# Patient Record
Sex: Female | Born: 1955 | Race: White | Hispanic: No | Marital: Married | Smoking: Never smoker
Health system: Southern US, Community
[De-identification: ages and names within clinical notes are randomized; demographics above are authoritative.]

## PROBLEM LIST (undated history)

## (undated) DIAGNOSIS — I10 Essential (primary) hypertension: Secondary | ICD-10-CM

## (undated) DIAGNOSIS — J302 Other seasonal allergic rhinitis: Secondary | ICD-10-CM

## (undated) DIAGNOSIS — R079 Chest pain, unspecified: Secondary | ICD-10-CM

## (undated) DIAGNOSIS — T7840XA Allergy, unspecified, initial encounter: Secondary | ICD-10-CM

## (undated) DIAGNOSIS — I341 Nonrheumatic mitral (valve) prolapse: Secondary | ICD-10-CM

## (undated) DIAGNOSIS — E785 Hyperlipidemia, unspecified: Secondary | ICD-10-CM

## (undated) DIAGNOSIS — F419 Anxiety disorder, unspecified: Secondary | ICD-10-CM

## (undated) DIAGNOSIS — K5790 Diverticulosis of intestine, part unspecified, without perforation or abscess without bleeding: Secondary | ICD-10-CM

## (undated) DIAGNOSIS — C801 Malignant (primary) neoplasm, unspecified: Secondary | ICD-10-CM

## (undated) HISTORY — DX: Anxiety disorder, unspecified: F41.9

## (undated) HISTORY — PX: TUBAL LIGATION: SHX77

## (undated) HISTORY — PX: ABDOMINAL HYSTERECTOMY: SHX81

## (undated) HISTORY — DX: Chest pain, unspecified: R07.9

## (undated) HISTORY — PX: KNEE ARTHROSCOPY: SUR90

## (undated) HISTORY — DX: Allergy, unspecified, initial encounter: T78.40XA

## (undated) HISTORY — PX: COLONOSCOPY: SHX174

## (undated) HISTORY — DX: Essential (primary) hypertension: I10

## (undated) HISTORY — DX: Other seasonal allergic rhinitis: J30.2

## (undated) HISTORY — DX: Nonrheumatic mitral (valve) prolapse: I34.1

## (undated) HISTORY — DX: Malignant (primary) neoplasm, unspecified: C80.1

## (undated) HISTORY — DX: Hyperlipidemia, unspecified: E78.5

## (undated) HISTORY — DX: Diverticulosis of intestine, part unspecified, without perforation or abscess without bleeding: K57.90

---

## 1974-10-12 HISTORY — PX: TONSILLECTOMY: SUR1361

## 1990-10-12 HISTORY — PX: SPINE SURGERY: SHX786

## 1991-10-13 HISTORY — PX: PARTIAL HYSTERECTOMY: SHX80

## 1998-03-19 ENCOUNTER — Ambulatory Visit (HOSPITAL_COMMUNITY): Admission: RE | Admit: 1998-03-19 | Discharge: 1998-03-19 | Payer: Self-pay | Admitting: Obstetrics and Gynecology

## 1998-07-05 ENCOUNTER — Ambulatory Visit (HOSPITAL_COMMUNITY): Admission: RE | Admit: 1998-07-05 | Discharge: 1998-07-05 | Payer: Self-pay | Admitting: Internal Medicine

## 1998-07-19 ENCOUNTER — Ambulatory Visit (HOSPITAL_COMMUNITY): Admission: RE | Admit: 1998-07-19 | Discharge: 1998-07-19 | Payer: Self-pay | Admitting: Internal Medicine

## 1998-09-19 ENCOUNTER — Ambulatory Visit (HOSPITAL_COMMUNITY): Admission: RE | Admit: 1998-09-19 | Discharge: 1998-09-19 | Payer: Self-pay | Admitting: Internal Medicine

## 1998-10-10 ENCOUNTER — Ambulatory Visit (HOSPITAL_COMMUNITY): Admission: RE | Admit: 1998-10-10 | Discharge: 1998-10-10 | Payer: Self-pay | Admitting: Internal Medicine

## 1998-10-21 ENCOUNTER — Ambulatory Visit (HOSPITAL_COMMUNITY): Admission: RE | Admit: 1998-10-21 | Discharge: 1998-10-21 | Payer: Self-pay | Admitting: Pulmonary Disease

## 1998-10-21 ENCOUNTER — Encounter: Payer: Self-pay | Admitting: *Deleted

## 1998-10-25 ENCOUNTER — Ambulatory Visit (HOSPITAL_COMMUNITY): Admission: RE | Admit: 1998-10-25 | Discharge: 1998-10-25 | Payer: Self-pay | Admitting: Obstetrics and Gynecology

## 1999-04-04 ENCOUNTER — Ambulatory Visit (HOSPITAL_COMMUNITY): Admission: RE | Admit: 1999-04-04 | Discharge: 1999-04-04 | Payer: Self-pay

## 1999-04-07 ENCOUNTER — Ambulatory Visit (HOSPITAL_COMMUNITY): Admission: RE | Admit: 1999-04-07 | Discharge: 1999-04-07 | Payer: Self-pay | Admitting: Obstetrics and Gynecology

## 1999-04-07 ENCOUNTER — Encounter: Payer: Self-pay | Admitting: Obstetrics and Gynecology

## 1999-05-27 ENCOUNTER — Other Ambulatory Visit: Admission: RE | Admit: 1999-05-27 | Discharge: 1999-05-27 | Payer: Self-pay | Admitting: Obstetrics and Gynecology

## 1999-07-30 ENCOUNTER — Emergency Department (HOSPITAL_COMMUNITY): Admission: EM | Admit: 1999-07-30 | Discharge: 1999-07-30 | Payer: Self-pay

## 2000-04-15 ENCOUNTER — Ambulatory Visit (HOSPITAL_COMMUNITY): Admission: RE | Admit: 2000-04-15 | Discharge: 2000-04-15 | Payer: Self-pay | Admitting: Obstetrics and Gynecology

## 2000-04-15 ENCOUNTER — Encounter: Payer: Self-pay | Admitting: Obstetrics and Gynecology

## 2000-04-17 ENCOUNTER — Encounter: Payer: Self-pay | Admitting: Internal Medicine

## 2000-04-17 ENCOUNTER — Ambulatory Visit (HOSPITAL_COMMUNITY): Admission: RE | Admit: 2000-04-17 | Discharge: 2000-04-17 | Payer: Self-pay | Admitting: Internal Medicine

## 2000-06-15 ENCOUNTER — Emergency Department (HOSPITAL_COMMUNITY): Admission: EM | Admit: 2000-06-15 | Discharge: 2000-06-15 | Payer: Self-pay | Admitting: *Deleted

## 2000-06-24 ENCOUNTER — Other Ambulatory Visit: Admission: RE | Admit: 2000-06-24 | Discharge: 2000-06-24 | Payer: Self-pay | Admitting: Obstetrics and Gynecology

## 2000-10-04 ENCOUNTER — Encounter: Payer: Self-pay | Admitting: Surgery

## 2000-10-04 ENCOUNTER — Emergency Department (HOSPITAL_COMMUNITY): Admission: EM | Admit: 2000-10-04 | Discharge: 2000-10-04 | Payer: Self-pay | Admitting: Internal Medicine

## 2000-10-04 ENCOUNTER — Encounter: Payer: Self-pay | Admitting: Endocrinology

## 2001-02-28 ENCOUNTER — Encounter: Payer: Self-pay | Admitting: Specialist

## 2001-02-28 ENCOUNTER — Ambulatory Visit (HOSPITAL_COMMUNITY): Admission: RE | Admit: 2001-02-28 | Discharge: 2001-02-28 | Payer: Self-pay | Admitting: Specialist

## 2001-05-31 ENCOUNTER — Encounter: Payer: Self-pay | Admitting: Obstetrics and Gynecology

## 2001-05-31 ENCOUNTER — Ambulatory Visit (HOSPITAL_COMMUNITY): Admission: RE | Admit: 2001-05-31 | Discharge: 2001-05-31 | Payer: Self-pay | Admitting: Obstetrics and Gynecology

## 2001-09-12 ENCOUNTER — Other Ambulatory Visit: Admission: RE | Admit: 2001-09-12 | Discharge: 2001-09-12 | Payer: Self-pay | Admitting: Obstetrics and Gynecology

## 2001-12-20 ENCOUNTER — Encounter: Payer: Self-pay | Admitting: Internal Medicine

## 2001-12-20 ENCOUNTER — Encounter: Admission: RE | Admit: 2001-12-20 | Discharge: 2001-12-20 | Payer: Self-pay | Admitting: Internal Medicine

## 2001-12-29 ENCOUNTER — Encounter: Payer: Self-pay | Admitting: Internal Medicine

## 2001-12-29 ENCOUNTER — Ambulatory Visit (HOSPITAL_COMMUNITY): Admission: RE | Admit: 2001-12-29 | Discharge: 2001-12-29 | Payer: Self-pay | Admitting: *Deleted

## 2002-04-05 ENCOUNTER — Encounter: Payer: Self-pay | Admitting: Cardiology

## 2002-04-05 ENCOUNTER — Ambulatory Visit (HOSPITAL_COMMUNITY): Admission: RE | Admit: 2002-04-05 | Discharge: 2002-04-05 | Payer: Self-pay | Admitting: Internal Medicine

## 2002-05-17 ENCOUNTER — Encounter: Payer: Self-pay | Admitting: Radiology

## 2002-06-06 ENCOUNTER — Ambulatory Visit (HOSPITAL_COMMUNITY): Admission: RE | Admit: 2002-06-06 | Discharge: 2002-06-06 | Payer: Self-pay | Admitting: Obstetrics and Gynecology

## 2002-06-06 ENCOUNTER — Encounter: Payer: Self-pay | Admitting: Obstetrics and Gynecology

## 2002-09-20 ENCOUNTER — Other Ambulatory Visit: Admission: RE | Admit: 2002-09-20 | Discharge: 2002-09-20 | Payer: Self-pay | Admitting: Obstetrics and Gynecology

## 2003-07-13 ENCOUNTER — Ambulatory Visit (HOSPITAL_COMMUNITY): Admission: RE | Admit: 2003-07-13 | Discharge: 2003-07-13 | Payer: Self-pay | Admitting: Obstetrics and Gynecology

## 2003-07-13 ENCOUNTER — Encounter: Payer: Self-pay | Admitting: Obstetrics and Gynecology

## 2003-09-07 ENCOUNTER — Encounter: Payer: Self-pay | Admitting: Internal Medicine

## 2004-02-25 ENCOUNTER — Ambulatory Visit (HOSPITAL_COMMUNITY): Admission: RE | Admit: 2004-02-25 | Discharge: 2004-02-25 | Payer: Self-pay | Admitting: Internal Medicine

## 2004-04-18 ENCOUNTER — Other Ambulatory Visit: Admission: RE | Admit: 2004-04-18 | Discharge: 2004-04-18 | Payer: Self-pay | Admitting: Obstetrics and Gynecology

## 2004-07-15 ENCOUNTER — Ambulatory Visit (HOSPITAL_COMMUNITY): Admission: RE | Admit: 2004-07-15 | Discharge: 2004-07-15 | Payer: Self-pay | Admitting: Obstetrics and Gynecology

## 2004-11-12 ENCOUNTER — Ambulatory Visit: Payer: Self-pay | Admitting: Internal Medicine

## 2004-11-12 ENCOUNTER — Observation Stay (HOSPITAL_COMMUNITY): Admission: EM | Admit: 2004-11-12 | Discharge: 2004-11-13 | Payer: Self-pay | Admitting: Internal Medicine

## 2004-11-12 ENCOUNTER — Ambulatory Visit: Payer: Self-pay | Admitting: Cardiology

## 2004-11-13 ENCOUNTER — Ambulatory Visit: Payer: Self-pay

## 2004-11-17 ENCOUNTER — Ambulatory Visit: Payer: Self-pay | Admitting: Internal Medicine

## 2005-01-29 ENCOUNTER — Ambulatory Visit: Payer: Self-pay | Admitting: Internal Medicine

## 2005-05-03 ENCOUNTER — Encounter: Payer: Self-pay | Admitting: Internal Medicine

## 2005-05-22 ENCOUNTER — Ambulatory Visit: Payer: Self-pay | Admitting: Internal Medicine

## 2005-05-28 ENCOUNTER — Ambulatory Visit: Payer: Self-pay | Admitting: Internal Medicine

## 2005-08-11 ENCOUNTER — Ambulatory Visit (HOSPITAL_COMMUNITY): Admission: RE | Admit: 2005-08-11 | Discharge: 2005-08-11 | Payer: Self-pay | Admitting: Obstetrics and Gynecology

## 2005-11-10 ENCOUNTER — Ambulatory Visit: Payer: Self-pay | Admitting: Internal Medicine

## 2005-11-13 ENCOUNTER — Other Ambulatory Visit: Admission: RE | Admit: 2005-11-13 | Discharge: 2005-11-13 | Payer: Self-pay | Admitting: Obstetrics and Gynecology

## 2005-12-07 ENCOUNTER — Ambulatory Visit: Payer: Self-pay | Admitting: Gastroenterology

## 2005-12-18 ENCOUNTER — Ambulatory Visit: Payer: Self-pay | Admitting: Gastroenterology

## 2006-02-05 ENCOUNTER — Ambulatory Visit: Payer: Self-pay | Admitting: Internal Medicine

## 2006-03-19 ENCOUNTER — Ambulatory Visit: Payer: Self-pay | Admitting: Internal Medicine

## 2006-04-13 ENCOUNTER — Ambulatory Visit: Payer: Self-pay | Admitting: Oncology

## 2006-06-10 ENCOUNTER — Ambulatory Visit: Payer: Self-pay | Admitting: Hematology & Oncology

## 2006-06-10 LAB — BASIC METABOLIC PANEL
CO2: 30 mEq/L (ref 19–32)
Chloride: 105 mEq/L (ref 96–112)
Glucose, Bld: 79 mg/dL (ref 70–99)
Potassium: 3.6 mEq/L (ref 3.5–5.3)
Sodium: 140 mEq/L (ref 135–145)

## 2006-06-15 ENCOUNTER — Ambulatory Visit: Payer: Self-pay | Admitting: Internal Medicine

## 2006-08-17 ENCOUNTER — Ambulatory Visit (HOSPITAL_COMMUNITY): Admission: RE | Admit: 2006-08-17 | Discharge: 2006-08-17 | Payer: Self-pay | Admitting: Internal Medicine

## 2006-09-13 ENCOUNTER — Emergency Department (HOSPITAL_COMMUNITY): Admission: EM | Admit: 2006-09-13 | Discharge: 2006-09-13 | Payer: Self-pay | Admitting: Family Medicine

## 2006-11-04 ENCOUNTER — Ambulatory Visit: Payer: Self-pay | Admitting: Internal Medicine

## 2006-11-16 ENCOUNTER — Ambulatory Visit: Payer: Self-pay | Admitting: Internal Medicine

## 2006-12-29 ENCOUNTER — Ambulatory Visit: Payer: Self-pay | Admitting: Internal Medicine

## 2007-01-25 ENCOUNTER — Encounter: Payer: Self-pay | Admitting: Internal Medicine

## 2007-01-25 LAB — CONVERTED CEMR LAB
Potassium: 4.1 meq/L (ref 3.5–5.3)
Total CK: 132 units/L (ref 7–177)
Vit D, 1,25-Dihydroxy: 22 (ref 20–57)

## 2007-03-15 ENCOUNTER — Ambulatory Visit: Payer: Self-pay | Admitting: Internal Medicine

## 2007-03-15 DIAGNOSIS — G44209 Tension-type headache, unspecified, not intractable: Secondary | ICD-10-CM

## 2007-03-16 ENCOUNTER — Encounter: Payer: Self-pay | Admitting: Internal Medicine

## 2007-03-17 LAB — CONVERTED CEMR LAB
ALT: 23 units/L (ref 0–35)
AST: 22 units/L (ref 0–37)
Albumin: 4.6 g/dL (ref 3.5–5.2)
Alkaline Phosphatase: 57 units/L (ref 39–117)
BUN: 17 mg/dL (ref 6–23)
Bilirubin, Direct: 0.1 mg/dL (ref 0.0–0.3)
CO2: 22 meq/L (ref 19–32)
Calcium: 11 mg/dL — ABNORMAL HIGH (ref 8.4–10.5)
Chloride: 106 meq/L (ref 96–112)
Cholesterol: 174 mg/dL (ref 0–200)
Creatinine, Ser: 0.8 mg/dL (ref 0.40–1.20)
Glucose, Bld: 84 mg/dL (ref 70–99)
HDL: 50 mg/dL (ref >39)
Indirect Bilirubin: 0.5 mg/dL (ref 0.0–0.9)
LDL Cholesterol: 107 mg/dL — ABNORMAL HIGH (ref 0–99)
Potassium: 4.6 meq/L (ref 3.5–5.3)
Sodium: 141 meq/L (ref 135–145)
TSH: 1.493 microintl units/mL (ref 0.350–5.50)
Total Bilirubin: 0.6 mg/dL (ref 0.3–1.2)
Total CHOL/HDL Ratio: 3.5
Total Protein: 7.3 g/dL (ref 6.0–8.3)
Triglycerides: 84 mg/dL (ref <150)
VLDL: 17 mg/dL (ref 0–40)

## 2007-03-21 ENCOUNTER — Encounter: Payer: Self-pay | Admitting: Internal Medicine

## 2007-03-23 ENCOUNTER — Encounter: Payer: Self-pay | Admitting: Internal Medicine

## 2007-03-24 ENCOUNTER — Encounter: Payer: Self-pay | Admitting: Internal Medicine

## 2007-03-26 LAB — CONVERTED CEMR LAB
Calcium, Total (PTH): 10.4 mg/dL (ref 8.4–10.5)
PTH: 38.8 pg/mL (ref 14.0–72.0)

## 2007-04-14 ENCOUNTER — Encounter (INDEPENDENT_AMBULATORY_CARE_PROVIDER_SITE_OTHER): Payer: Self-pay | Admitting: *Deleted

## 2007-05-04 ENCOUNTER — Ambulatory Visit: Payer: Self-pay | Admitting: Internal Medicine

## 2007-05-05 ENCOUNTER — Encounter: Payer: Self-pay | Admitting: Internal Medicine

## 2007-05-06 LAB — CONVERTED CEMR LAB
Calcium: 10.4 mg/dL (ref 8.4–10.5)
Free T4: 1.49 ng/dL (ref 0.89–1.80)
Magnesium: 2.1 mg/dL (ref 1.5–2.5)
Potassium: 4 meq/L (ref 3.5–5.3)
T3, Free: 3.2 pg/mL (ref 2.3–4.2)
TSH: 2.143 microintl units/mL (ref 0.350–5.50)

## 2007-05-09 ENCOUNTER — Encounter (INDEPENDENT_AMBULATORY_CARE_PROVIDER_SITE_OTHER): Payer: Self-pay | Admitting: *Deleted

## 2007-06-08 ENCOUNTER — Ambulatory Visit: Payer: Self-pay | Admitting: Cardiovascular Disease

## 2007-06-17 ENCOUNTER — Ambulatory Visit: Payer: Self-pay

## 2007-06-17 ENCOUNTER — Encounter (INDEPENDENT_AMBULATORY_CARE_PROVIDER_SITE_OTHER): Payer: Self-pay | Admitting: *Deleted

## 2007-07-15 ENCOUNTER — Telehealth: Payer: Self-pay | Admitting: Internal Medicine

## 2007-07-15 ENCOUNTER — Ambulatory Visit: Payer: Self-pay | Admitting: Internal Medicine

## 2007-07-18 ENCOUNTER — Encounter (INDEPENDENT_AMBULATORY_CARE_PROVIDER_SITE_OTHER): Payer: Self-pay | Admitting: *Deleted

## 2007-07-30 ENCOUNTER — Ambulatory Visit (HOSPITAL_COMMUNITY): Admission: RE | Admit: 2007-07-30 | Discharge: 2007-07-30 | Payer: Self-pay | Admitting: Orthopedic Surgery

## 2007-08-23 ENCOUNTER — Ambulatory Visit: Payer: Self-pay | Admitting: Cardiovascular Disease

## 2007-08-23 ENCOUNTER — Telehealth (INDEPENDENT_AMBULATORY_CARE_PROVIDER_SITE_OTHER): Payer: Self-pay | Admitting: *Deleted

## 2007-09-07 ENCOUNTER — Telehealth (INDEPENDENT_AMBULATORY_CARE_PROVIDER_SITE_OTHER): Payer: Self-pay | Admitting: *Deleted

## 2007-09-27 ENCOUNTER — Telehealth (INDEPENDENT_AMBULATORY_CARE_PROVIDER_SITE_OTHER): Payer: Self-pay | Admitting: *Deleted

## 2007-10-04 ENCOUNTER — Ambulatory Visit: Payer: Self-pay | Admitting: Internal Medicine

## 2007-10-04 DIAGNOSIS — E782 Mixed hyperlipidemia: Secondary | ICD-10-CM

## 2007-10-14 ENCOUNTER — Encounter (INDEPENDENT_AMBULATORY_CARE_PROVIDER_SITE_OTHER): Payer: Self-pay | Admitting: *Deleted

## 2007-10-14 LAB — CONVERTED CEMR LAB
ALT: 26 units/L (ref 0–35)
AST: 27 units/L (ref 0–37)
BUN: 7 mg/dL (ref 6–23)
Creatinine, Ser: 0.6 mg/dL (ref 0.4–1.2)
Potassium: 3.4 meq/L — ABNORMAL LOW (ref 3.5–5.1)

## 2007-10-28 ENCOUNTER — Ambulatory Visit (HOSPITAL_COMMUNITY): Admission: RE | Admit: 2007-10-28 | Discharge: 2007-10-28 | Payer: Self-pay | Admitting: Obstetrics and Gynecology

## 2007-11-02 ENCOUNTER — Telehealth (INDEPENDENT_AMBULATORY_CARE_PROVIDER_SITE_OTHER): Payer: Self-pay | Admitting: *Deleted

## 2007-11-07 ENCOUNTER — Encounter: Payer: Self-pay | Admitting: Internal Medicine

## 2007-12-06 ENCOUNTER — Telehealth (INDEPENDENT_AMBULATORY_CARE_PROVIDER_SITE_OTHER): Payer: Self-pay | Admitting: *Deleted

## 2007-12-16 ENCOUNTER — Ambulatory Visit: Payer: Self-pay | Admitting: Internal Medicine

## 2007-12-18 LAB — CONVERTED CEMR LAB: Cortisol, Plasma: 14.9 ug/dL

## 2007-12-22 ENCOUNTER — Encounter: Payer: Self-pay | Admitting: Internal Medicine

## 2007-12-27 ENCOUNTER — Encounter (INDEPENDENT_AMBULATORY_CARE_PROVIDER_SITE_OTHER): Payer: Self-pay | Admitting: *Deleted

## 2007-12-30 ENCOUNTER — Encounter (INDEPENDENT_AMBULATORY_CARE_PROVIDER_SITE_OTHER): Payer: Self-pay | Admitting: *Deleted

## 2007-12-30 LAB — CONVERTED CEMR LAB
Catecholamines Tot(E+NE) 24 Hr U: 0.039 mg/24hr
Dopamine 24 Hr Urine: 116 mcg/24hr (ref <500)
Epinephrine 24 Hr Urine: 3 mcg/24hr (ref <20)
Metaneph Total, Ur: 266 ug/24hr (ref 224–832)
Metanephrines, Ur: 61 — ABNORMAL LOW (ref 90–315)
Norepinephrine 24 Hr Urine: 36 mcg/24hr (ref <80)
Normetanephrine, 24H Ur: 205 (ref 122–676)

## 2008-01-16 ENCOUNTER — Ambulatory Visit: Payer: Self-pay | Admitting: Internal Medicine

## 2008-01-16 DIAGNOSIS — I10 Essential (primary) hypertension: Secondary | ICD-10-CM

## 2008-01-23 ENCOUNTER — Encounter (INDEPENDENT_AMBULATORY_CARE_PROVIDER_SITE_OTHER): Payer: Self-pay | Admitting: *Deleted

## 2008-01-23 LAB — CONVERTED CEMR LAB
ALT: 30 units/L (ref 0–35)
AST: 29 units/L (ref 0–37)
Albumin: 3.6 g/dL (ref 3.5–5.2)
Alkaline Phosphatase: 43 units/L (ref 39–117)
BUN: 12 mg/dL (ref 6–23)
Basophils Absolute: 0 10*3/uL (ref 0.0–0.1)
Basophils Relative: 0.4 % (ref 0.0–1.0)
Bilirubin, Direct: 0.1 mg/dL (ref 0.0–0.3)
CO2: 29 meq/L (ref 19–32)
Calcium: 9.8 mg/dL (ref 8.4–10.5)
Chloride: 111 meq/L (ref 96–112)
Cholesterol: 222 mg/dL (ref 0–200)
Creatinine, Ser: 0.7 mg/dL (ref 0.4–1.2)
Direct LDL: 174.3 mg/dL
Eosinophils Absolute: 0.1 10*3/uL (ref 0.0–0.7)
Eosinophils Relative: 2.6 % (ref 0.0–5.0)
Free T4: 1 ng/dL (ref 0.6–1.6)
GFR calc Af Amer: 113 mL/min
GFR calc non Af Amer: 93 mL/min
Glucose, Bld: 94 mg/dL (ref 70–99)
HCT: 41 % (ref 36.0–46.0)
HDL: 39.7 mg/dL (ref >39.0)
Hemoglobin: 13.8 g/dL (ref 12.0–15.0)
Lymphocytes Relative: 24.5 % (ref 12.0–46.0)
MCHC: 33.6 g/dL (ref 30.0–36.0)
MCV: 89.6 fL (ref 78.0–100.0)
Monocytes Absolute: 0.5 10*3/uL (ref 0.1–1.0)
Monocytes Relative: 8.4 % (ref 3.0–12.0)
Neutro Abs: 3.7 10*3/uL (ref 1.4–7.7)
Neutrophils Relative %: 64.1 % (ref 43.0–77.0)
Platelets: 259 10*3/uL (ref 150–400)
Potassium: 3.7 meq/L (ref 3.5–5.1)
RBC: 4.58 M/uL (ref 3.87–5.11)
RDW: 12.4 % (ref 11.5–14.6)
Rheumatoid fact SerPl-aCnc: <20 intl units/mL — ABNORMAL LOW (ref 0.0–20.0)
Sed Rate: 13 mm/hr (ref 0–22)
Sodium: 142 meq/L (ref 135–145)
TSH: 1.71 microintl units/mL (ref 0.35–5.50)
Total Bilirubin: 0.8 mg/dL (ref 0.3–1.2)
Total CHOL/HDL Ratio: 5.6
Total CK: 95 units/L (ref 7–177)
Total Protein: 6.5 g/dL (ref 6.0–8.3)
Triglycerides: 105 mg/dL (ref 0–149)
Uric Acid, Serum: 5.5 mg/dL (ref 2.4–7.0)
VLDL: 21 mg/dL (ref 0–40)
WBC: 5.7 10*3/uL (ref 4.5–10.5)

## 2008-01-30 ENCOUNTER — Encounter: Payer: Self-pay | Admitting: Internal Medicine

## 2008-02-03 ENCOUNTER — Telehealth (INDEPENDENT_AMBULATORY_CARE_PROVIDER_SITE_OTHER): Payer: Self-pay | Admitting: *Deleted

## 2008-02-09 ENCOUNTER — Encounter (INDEPENDENT_AMBULATORY_CARE_PROVIDER_SITE_OTHER): Payer: Self-pay | Admitting: *Deleted

## 2008-02-13 ENCOUNTER — Telehealth (INDEPENDENT_AMBULATORY_CARE_PROVIDER_SITE_OTHER): Payer: Self-pay | Admitting: *Deleted

## 2008-05-08 ENCOUNTER — Telehealth (INDEPENDENT_AMBULATORY_CARE_PROVIDER_SITE_OTHER): Payer: Self-pay | Admitting: *Deleted

## 2008-05-22 ENCOUNTER — Ambulatory Visit: Payer: Self-pay | Admitting: Internal Medicine

## 2008-07-18 ENCOUNTER — Ambulatory Visit: Payer: Self-pay | Admitting: Internal Medicine

## 2008-07-18 DIAGNOSIS — F411 Generalized anxiety disorder: Secondary | ICD-10-CM

## 2008-07-18 DIAGNOSIS — K5730 Diverticulosis of large intestine without perforation or abscess without bleeding: Secondary | ICD-10-CM

## 2008-07-18 LAB — CONVERTED CEMR LAB
Cholesterol, target level: 200 mg/dL
HDL goal, serum: 40 mg/dL
LDL Goal: 130 mg/dL

## 2008-07-22 LAB — CONVERTED CEMR LAB
ALT: 27 units/L (ref 0–35)
AST: 29 units/L (ref 0–37)
Albumin: 3.7 g/dL (ref 3.5–5.2)
Alkaline Phosphatase: 59 units/L (ref 39–117)
BUN: 9 mg/dL (ref 6–23)
Basophils Absolute: 0 10*3/uL (ref 0.0–0.1)
Basophils Relative: 0.3 % (ref 0.0–3.0)
Bilirubin, Direct: 0.1 mg/dL (ref 0.0–0.3)
CO2: 31 meq/L (ref 19–32)
Calcium: 9.9 mg/dL (ref 8.4–10.5)
Chloride: 107 meq/L (ref 96–112)
Cholesterol: 173 mg/dL (ref 0–200)
Creatinine, Ser: 0.8 mg/dL (ref 0.4–1.2)
Eosinophils Absolute: 0.1 10*3/uL (ref 0.0–0.7)
Eosinophils Relative: 1.8 % (ref 0.0–5.0)
GFR calc Af Amer: 96 mL/min
GFR calc non Af Amer: 80 mL/min
Glucose, Bld: 87 mg/dL (ref 70–99)
HCT: 40.7 % (ref 36.0–46.0)
HDL: 47 mg/dL (ref >39.0)
Hemoglobin: 13.9 g/dL (ref 12.0–15.0)
LDL Cholesterol: 106 mg/dL — ABNORMAL HIGH (ref 0–99)
Lymphocytes Relative: 22.9 % (ref 12.0–46.0)
MCHC: 34.1 g/dL (ref 30.0–36.0)
MCV: 90.1 fL (ref 78.0–100.0)
Monocytes Absolute: 0.4 10*3/uL (ref 0.1–1.0)
Monocytes Relative: 7.4 % (ref 3.0–12.0)
Neutro Abs: 4 10*3/uL (ref 1.4–7.7)
Neutrophils Relative %: 67.6 % (ref 43.0–77.0)
Platelets: 262 10*3/uL (ref 150–400)
Potassium: 3.8 meq/L (ref 3.5–5.1)
RBC: 4.51 M/uL (ref 3.87–5.11)
RDW: 12.2 % (ref 11.5–14.6)
Sodium: 142 meq/L (ref 135–145)
TSH: 2.41 microintl units/mL (ref 0.35–5.50)
Total Bilirubin: 0.9 mg/dL (ref 0.3–1.2)
Total CHOL/HDL Ratio: 3.7
Total Protein: 6.7 g/dL (ref 6.0–8.3)
Triglycerides: 98 mg/dL (ref 0–149)
VLDL: 20 mg/dL (ref 0–40)
WBC: 5.9 10*3/uL (ref 4.5–10.5)

## 2008-07-23 ENCOUNTER — Encounter (INDEPENDENT_AMBULATORY_CARE_PROVIDER_SITE_OTHER): Payer: Self-pay | Admitting: *Deleted

## 2008-10-12 HISTORY — PX: ROTATOR CUFF REPAIR: SHX139

## 2008-11-27 ENCOUNTER — Telehealth: Payer: Self-pay | Admitting: Internal Medicine

## 2008-12-05 ENCOUNTER — Encounter: Payer: Self-pay | Admitting: Internal Medicine

## 2009-01-29 ENCOUNTER — Ambulatory Visit (HOSPITAL_COMMUNITY): Admission: RE | Admit: 2009-01-29 | Discharge: 2009-01-29 | Payer: Self-pay | Admitting: Obstetrics and Gynecology

## 2009-03-04 ENCOUNTER — Ambulatory Visit: Payer: Self-pay | Admitting: Internal Medicine

## 2009-04-24 ENCOUNTER — Encounter: Payer: Self-pay | Admitting: Internal Medicine

## 2009-05-03 ENCOUNTER — Encounter: Payer: Self-pay | Admitting: Internal Medicine

## 2009-05-05 LAB — CONVERTED CEMR LAB
ALT: 28 units/L (ref 0–35)
AST: 28 units/L (ref 0–37)
Albumin: 3.8 g/dL (ref 3.5–5.2)
Alkaline Phosphatase: 58 units/L (ref 39–117)
BUN: 16 mg/dL (ref 6–23)
Bilirubin, Direct: 0.1 mg/dL (ref 0.0–0.3)
Cholesterol: 160 mg/dL (ref 0–200)
Creatinine, Ser: 0.73 mg/dL (ref 0.40–1.20)
HDL: 46 mg/dL (ref >39)
Indirect Bilirubin: 0.3 mg/dL (ref 0.0–0.9)
LDL Cholesterol: 98 mg/dL (ref 0–99)
Potassium: 4 meq/L (ref 3.5–5.3)
Total Bilirubin: 0.4 mg/dL (ref 0.3–1.2)
Total CHOL/HDL Ratio: 3.5
Total Protein: 6.7 g/dL (ref 6.0–8.3)
Triglycerides: 79 mg/dL (ref <150)
VLDL: 16 mg/dL (ref 0–40)

## 2009-05-06 ENCOUNTER — Encounter (INDEPENDENT_AMBULATORY_CARE_PROVIDER_SITE_OTHER): Payer: Self-pay | Admitting: *Deleted

## 2009-05-27 ENCOUNTER — Telehealth (INDEPENDENT_AMBULATORY_CARE_PROVIDER_SITE_OTHER): Payer: Self-pay | Admitting: *Deleted

## 2010-01-30 ENCOUNTER — Encounter: Payer: Self-pay | Admitting: Internal Medicine

## 2010-01-30 ENCOUNTER — Ambulatory Visit (HOSPITAL_COMMUNITY): Admission: RE | Admit: 2010-01-30 | Discharge: 2010-01-30 | Payer: Self-pay | Admitting: Obstetrics and Gynecology

## 2010-01-30 LAB — HM DEXA SCAN

## 2010-03-18 ENCOUNTER — Ambulatory Visit: Payer: Self-pay | Admitting: Internal Medicine

## 2010-03-19 ENCOUNTER — Telehealth (INDEPENDENT_AMBULATORY_CARE_PROVIDER_SITE_OTHER): Payer: Self-pay | Admitting: *Deleted

## 2010-05-06 ENCOUNTER — Ambulatory Visit: Payer: Self-pay | Admitting: Internal Medicine

## 2010-05-06 DIAGNOSIS — E559 Vitamin D deficiency, unspecified: Secondary | ICD-10-CM

## 2010-05-13 LAB — CONVERTED CEMR LAB
ALT: 23 units/L (ref 0–35)
AST: 26 units/L (ref 0–37)
Albumin: 4.2 g/dL (ref 3.5–5.2)
Alkaline Phosphatase: 75 units/L (ref 39–117)
BUN: 9 mg/dL (ref 6–23)
Basophils Absolute: 0 10*3/uL (ref 0.0–0.1)
Basophils Relative: 0.8 % (ref 0.0–3.0)
Bilirubin, Direct: 0.1 mg/dL (ref 0.0–0.3)
CO2: 30 meq/L (ref 19–32)
Calcium: 10.2 mg/dL (ref 8.4–10.5)
Chloride: 104 meq/L (ref 96–112)
Cholesterol: 329 mg/dL — ABNORMAL HIGH (ref 0–200)
Creatinine, Ser: 0.7 mg/dL (ref 0.4–1.2)
Direct LDL: 260.1 mg/dL
Eosinophils Absolute: 0.1 10*3/uL (ref 0.0–0.7)
Eosinophils Relative: 2.1 % (ref 0.0–5.0)
GFR calc non Af Amer: 97.19 mL/min (ref >60)
Glucose, Bld: 95 mg/dL (ref 70–99)
HCT: 40.3 % (ref 36.0–46.0)
HDL: 52.5 mg/dL (ref >39.00)
Hemoglobin: 13.9 g/dL (ref 12.0–15.0)
Lymphocytes Relative: 26.4 % (ref 12.0–46.0)
Lymphs Abs: 1.5 10*3/uL (ref 0.7–4.0)
MCHC: 34.7 g/dL (ref 30.0–36.0)
MCV: 89.6 fL (ref 78.0–100.0)
Monocytes Absolute: 0.4 10*3/uL (ref 0.1–1.0)
Monocytes Relative: 7.6 % (ref 3.0–12.0)
Neutro Abs: 3.5 10*3/uL (ref 1.4–7.7)
Neutrophils Relative %: 63.1 % (ref 43.0–77.0)
Platelets: 244 10*3/uL (ref 150.0–400.0)
Potassium: 3.7 meq/L (ref 3.5–5.1)
RBC: 4.49 M/uL (ref 3.87–5.11)
RDW: 13.2 % (ref 11.5–14.6)
Sodium: 139 meq/L (ref 135–145)
TSH: 2.2 microintl units/mL (ref 0.35–5.50)
Total Bilirubin: 0.9 mg/dL (ref 0.3–1.2)
Total CHOL/HDL Ratio: 6
Total Protein: 7.2 g/dL (ref 6.0–8.3)
Triglycerides: 131 mg/dL (ref 0.0–149.0)
VLDL: 26.2 mg/dL (ref 0.0–40.0)
Vit D, 25-Hydroxy: 36 ng/mL (ref 30–89)
WBC: 5.5 10*3/uL (ref 4.5–10.5)

## 2010-06-19 ENCOUNTER — Telehealth (INDEPENDENT_AMBULATORY_CARE_PROVIDER_SITE_OTHER): Payer: Self-pay | Admitting: *Deleted

## 2010-06-20 ENCOUNTER — Ambulatory Visit: Payer: Self-pay | Admitting: Internal Medicine

## 2010-08-06 ENCOUNTER — Telehealth (INDEPENDENT_AMBULATORY_CARE_PROVIDER_SITE_OTHER): Payer: Self-pay | Admitting: *Deleted

## 2010-11-02 ENCOUNTER — Encounter: Payer: Self-pay | Admitting: Internal Medicine

## 2010-11-11 NOTE — Progress Notes (Signed)
Summary: Medication Concerns   Phone Note From Pharmacy Call back at [phone]   Caller: [facility] Summary of Call: Message left on VM: RX for Levaquin was sent in and Levaquin is going generic and the brand name is on back order right now, ? would you like to change med or send to a different pharmacy.   Dr.Hopper please advise./[facility]  March 19, 2010 9:29 AM   Follow-up for Phone Call        Avelox 400 mg once daily #5 Follow-up by: Marga Melnick MD,  March 19, 2010 5:06 PM  Additional Follow-up for Phone Call Additional follow up Details #1::        Spoke with [facility] in the pharmacy and med change given Additional Follow-up by: Shonna Chock,  March 19, 2010 5:08 PM    New/Updated Medications: AVELOX 400 MG TABS (MOXIFLOXACIN HCL) 1 by mouth x 5 days

## 2010-11-11 NOTE — Assessment & Plan Note (Signed)
Summary: FOLLOWUP/KN   Vital Signs:  Patient profile:   55 year old female Weight:      165.4 pounds Temp:     98.3 degrees F oral Pulse rate:   64 / minute Resp:     14 per minute BP sitting:   132 / 86  (left arm) Cuff size:   large  Vitals Entered By: Shonna Chock CMA (June 20, 2010 3:32 PM) CC: B/P concerns and refill Cyclobenzaprine Comments Patient's Cuff: 146/89, pulse 79   CC:  B/P concerns and refill Cyclobenzaprine.  History of Present Illness: Hypertension Follow-Up      This is a 55 year old woman who presents for Hypertension follow-up.  The patient reports headaches, but denies lightheadedness, urinary frequency, edema, and fatigue.  The patient denies the following associated symptoms: chest pain, chest pressure, exercise intolerance, dyspnea, and palpitations.  Adjunctive measures currently used by the patient include salt restriction. BP range 135/88- 136/101. She feels the trigger is stressof husband's job loss & daughter's health.   Allergies: 1)  ! * Shrimp 2)  Keflex 3)  * Emycin 4)  Codeine 5)  * Calcium Channel Blockers 6)  Maxzide 7)  Norvasc  Physical Exam  General:  well-nourished,in no acute distress; alert,appropriate and cooperative throughout examination Lungs:  Normal respiratory effort, chest expands symmetrically. Lungs are clear to auscultation, no crackles or wheezes. Heart:  Normal rate and regular rhythm. S1 and S2 normal without gallop, murmur, click, rub. Pulses:  R and L carotid,radial,dorsalis pedis and posterior tibial pulses are full and equal bilaterally Extremities:  No clubbing, cyanosis, edema. Psych:  memory intact for recent and remote, normally interactive, good eye contact, not anxious appearing, and not depressed appearing.     Impression & Recommendations:  Problem # 1:  HYPERTENSION, ESSENTIAL NOS (ICD-401.9)  Her updated medication list for this problem includes:    Lisinopril 5 Mg Tabs (Lisinopril) .Marland Kitchen... 2   by mouth once daily    Spironolactone 25 Mg Tabs (Spironolactone) .Marland Kitchen... 1 tab by mouth  twice   daily   Complete Medication List: 1)  Lisinopril 20 Mg Tabs (lisinopril)  .... 1/2 -1  by mouth once daily to keep bp average < 135/85 2)  Cyclobenzaprine Hcl 10 Mg Tabs (Cyclobenzaprine hcl) .... 1/2 -1 at bedtime as needed 3)  Omega Three  .... Once daily 4)  Spironolactone 25 Mg Tabs (Spironolactone) .Marland Kitchen.. 1 two times a day 5)  Crestor 20 Mg Tabs (Rosuvastatin calcium) .Marland Kitchen.. 1 once daily  Other Orders: Admin 1st Vaccine (16109) Flu Vaccine 53yrs + (60454) Flu Vaccine Consent Questions     Do you have a history of severe allergic reactions to this vaccine? no    Any prior history of allergic reactions to egg and/or gelatin? no    Do you have a sensitivity to the preservative Thimersol? no    Do you have a past history of Guillan-Barre Syndrome? no    Do you currently have an acute febrile illness? no    Have you ever had a severe reaction to latex? no    Vaccine information given and explained to patient? yes    Are you currently pregnant? no    Lot Number:[id number]   Exp Date:04/11/2011   Site Given  Left Deltoid IMOrders: Admin 1st Vaccine (09811) Flu Vaccine 26yrs + (91478)  Patient Instructions: 1)  Call if excess stress needs to be addressed. 2)  Check your Blood Pressure regularly.Your goal = AVERAGE < 135/85.  Prescriptions: SPIRONOLACTONE 25 MG TABS (SPIRONOLACTONE) 1 two times a day  #180 x 1   Entered and Authorized by:   Marga Melnick MD   Signed by:   Marga Melnick MD on 06/20/2010   Method used:   Print then Give to Patient   RxID:   [id number] LISINOPRIL 20 MG TABS (LISINOPRIL) 1/2 -1  by mouth once daily to keep BP average < 135/85  #90 x 1   Entered and Authorized by:   Marga Melnick MD   Signed by:   Marga Melnick MD on 06/20/2010   Method used:   Print then Give to Patient   RxID:   [id number]  .lbflu

## 2010-11-11 NOTE — Assessment & Plan Note (Signed)
Summary: pneumonia??//lch   Vital Signs:  Patient profile:   55 year old female Weight:      171.8 pounds BMI:     31.03 Temp:     98.5 degrees F oral Pulse rate:   84 / minute Resp:     14 per minute BP sitting:   116 / 78  (left arm) Cuff size:   large  Vitals Entered By: Shonna Chock (March 18, 2010 2:41 PM) CC: Not feeling well Comments REVIEWED MED LIST, PATIENT AGREED DOSE AND INSTRUCTION CORRECT    CC:  Not feeling well.  History of Present Illness: Onset as ST & frontal headache 03/15/2010 followed by earache & chest pressure with purulence. Rx: vitamin C, Benadryl at bedtime .  Allergies: 1)  ! * Shrimp 2)  Keflex 3)  * Emycin 4)  Codeine 5)  * Calcium Channel Blockers 6)  Maxzide 7)  Norvasc  Review of Systems General:  Denies chills, fever, and sweats. ENT:  Complains of nasal congestion; denies ear discharge; No facial or dental pain; scant nasal D/C . Using Neti pot . Resp:  Complains of shortness of breath, sputum productive, and wheezing; denies chest pain with inspiration, coughing up blood, and pleuritic; Slight yellow; no PMH of asthma.  Physical Exam  General:  well-nourished,in no acute distress; alert,appropriate and cooperative throughout examination Ears:  External ear exam shows no significant lesions or deformities.  Otoscopic examination reveals  wax t bilaterally  Hearing is grossly normal bilaterally. Nose:  External nasal examination shows no deformity or inflammation. Nasal mucosa are pink and moist without lesions or exudates. Mouth:  Oral mucosa and oropharynx without lesions or exudates.  Teeth in good repair. Slightly hoarse Lungs:  Normal respiratory effort, chest expands symmetrically. Lungs are clear to auscultation, no crackles or wheezes. Dry cough   Impression & Recommendations:  Problem # 1:  BRONCHITIS-ACUTE (ICD-466.0)  Her updated medication list for this problem includes:    Levaquin 500 Mg Tabs (Levofloxacin) .Marland Kitchen... 1  once daily  Problem # 2:  URI (ICD-465.9)  Complete Medication List: 1)  Lisinopril 5 Mg Tabs (Lisinopril) .Marland Kitchen.. 1 by mouth once daily 2)  Cyclobenzaprine Hcl 10 Mg Tabs (Cyclobenzaprine hcl) .... 1/2 -1 at bedtime as needed 3)  Omega Three  .... Once daily 4)  Vytorin 10-20 Mg Tabs (Ezetimibe-simvastatin) .Marland Kitchen.. 1 by mouth qhs 5)  Spironolactone 25 Mg Tabs (Spironolactone) .Marland Kitchen.. 1 tab by mouth once daily as needed for edema 6)  Levaquin 500 Mg Tabs (Levofloxacin) .Marland Kitchen.. 1 once daily  Patient Instructions: 1)  Drink as much fluid as you can tolerate for the next few days. Robitussin DM as needed as per package. Continue Neti pot once daily until sinuses are clear. Advair 100/50  sample as recommended for cough, wheezing. Prescriptions: LEVAQUIN 500 MG TABS (LEVOFLOXACIN) 1 once daily  #5 x 0   Entered and Authorized by:   Marga Melnick MD   Signed by:   Marga Melnick MD on 03/18/2010   Method used:   Faxed to ...       [facility]* (retail)       [address] [place], [place]  [address]       Ph: [phone] or [phone]       Fax: [phone]   RxID:   [id number]

## 2010-11-11 NOTE — Assessment & Plan Note (Signed)
Summary: CPX AND FASTING LABS ///SPH   Vital Signs:  Patient profile:   55 year old female Height:      62.75 inches Weight:      170.4 pounds Temp:     98.2 degrees F oral Pulse rate:   72 / minute Resp:     14 per minute BP sitting:   122 / 80  (left arm) Cuff size:   large  Vitals Entered By: Shonna Chock CMA (May 06, 2010 8:46 AM)  Comments Patient will check on her TDaP vaccine, patient was employeed with [employer] and [employer] and possibly had vaccine update there.    History of Present Illness: Mrs. Eber is here for a physical; Dr. Talmage Nap documented hypercalcemia (? 10.6) in 01/2010.Recheck was requested. Her BMD revealed "bone loss" in 03/2010.  Images reviewed ; no hip images.PMH of vitamin D deficiency. Hyperlipidemia Follow-Up      This is a 55 year old woman who presents for Hyperlipidemia follow-up.  The patient denies muscle aches, GI upset, abdominal pain, flushing, itching, constipation, diarrhea, and fatigue.  Other symptoms include pedal edema which is chronic, worse in Summer.  The patient denies the following symptoms: chest pain/pressure, exercise intolerance, dypsnea, palpitations, and syncope.  Compliance with medications (by patient report) has been near 100%.  Dietary compliance has been excellent.  The patient reports exercising 3-5 X per week. She teaches yoga 3X/week.  Adjunctive measures currently used by the patient include ASA.   Hypertension Follow-Up      The patient also presents for Hypertension follow-up.  The patient denies lightheadedness, urinary frequency, headaches, and rash.  Compliance with medications (by patient report) has been near 100%.  Adjunctive measures currently used by the patient include salt restriction.  BP @ home 110/65-120/80.  Lipid Management History:      Positive NCEP/ATP III risk factors include hypertension.  Negative NCEP/ATP III risk factors include female age less than 56 years old, non-diabetic, no family  history for ischemic heart disease, non-tobacco-user status, no ASHD (atherosclerotic heart disease), no prior stroke/TIA, no peripheral vascular disease, and no history of aortic aneurysm.     Current Medications (verified): 1)  Lisinopril 5 Mg Tabs (Lisinopril) .Marland Kitchen.. 1 By Mouth Once Daily 2)  Cyclobenzaprine Hcl 10 Mg  Tabs (Cyclobenzaprine Hcl) .... 1/2 -1 At Bedtime As Needed 3)  Omega Three .... Once Daily 4)  Vytorin 10-20 Mg  Tabs (Ezetimibe-Simvastatin) .Marland Kitchen.. 1 By Mouth Qhs 5)  Spironolactone 25 Mg Tabs (Spironolactone) .Marland Kitchen.. 1 Tab By Mouth Once Daily As Needed For Edema  Allergies: 1)  ! * Shrimp 2)  Keflex 3)  * Emycin 4)  Codeine 5)  * Calcium Channel Blockers 6)  Maxzide 7)  Norvasc  Past History:  Past Medical History: Hypertension Chest pain 2006: non cardiac etiology, Dr Eden Emms  palpitations, PMH of ativ chest tightness; nege  nuclear stress test 2008 headache, tension Diverticulosis, colon Hyperlipidemia: Framingham Study LDL goal = < 160.NMR Lipoprofile 2004: LDL 121(1352/649), HDL 38,TG 92. LDL goal= < 115, ideally < 90.  Past Surgical History: Hysterectomy for adenomyosis with heavy menses Tonsillectomy Lumbar laminectomy L5,S1 knee sugery bilaterally (Arthroscopic) G2 P2 Rotator cuff repair 03/2009, Dr Thomasena Edis  Family History: Father: alcoholism Mother: Guillian Barre,thyroid disease ,hyperlipidemia Siblings: 2 bros &  1 sister:increased lipids   Social History: Occupation: Network engineer Married Never Smoked Alcohol use-yes: rarely Regular exercise-yes  Review of Systems General:  Denies chills, fever, sleep disorder, sweats, and weight loss.  Eyes:  Denies blurring, double vision, and vision loss-both eyes. ENT:  Denies decreased hearing, difficulty swallowing, and hoarseness. Resp:  Denies cough, shortness of breath, sputum productive, and wheezing. GI:  Denies abdominal pain, bloody stools, dark tarry stools, and indigestion. GU:   Denies discharge, dysuria, and hematuria. MS:  Denies joint pain, joint redness, joint swelling, low back pain, mid back pain, and thoracic pain; Occasional swelling R knee. Derm:  Denies changes in nail beds, dryness, hair loss, lesion(s), and rash. Neuro:  Denies disturbances in coordination, numbness, poor balance, and tingling. Psych:  Complains of anxiety and easily tearful; denies depression, easily angered, and irritability; Family stress issues. Endo:  Denies cold intolerance, excessive hunger, excessive thirst, excessive urination, and heat intolerance. Heme:  Denies abnormal bruising and bleeding. Allergy:  Complains of itching eyes and sneezing; No Rx.  Physical Exam  General:  well-nourished; alert,appropriate and cooperative throughout examination Head:  Normocephalic and atraumatic without obvious abnormalities.  hair very fine Eyes:  No corneal or conjunctival inflammation noted. Perrla but dilated. Funduscopic exam benign, without hemorrhages, exudates or papilledema.  Ears:  External ear exam shows no significant lesions or deformities.   Hearing is grossly normal bilaterally.Some wax bilaterally. Nose:  External nasal examination shows no deformity or inflammation. Nasal mucosa are pink and moist without lesions or exudates. Mouth:  Oral mucosa and oropharynx without lesions or exudates.  Teeth in good repair. Neck:  No deformities, masses, or tenderness noted. Lungs:  Normal respiratory effort, chest expands symmetrically. Lungs are clear to auscultation, no crackles or wheezes. Heart:  Normal rate and regular rhythm. S1 and S2 normal without gallop, murmur, click, rub . S4 Abdomen:  Bowel sounds positive,abdomen soft and non-tender without masses, organomegaly or hernias noted. Genitalia:  Dr Vincente Poli Msk:  No deformity or scoliosis noted of thoracic or lumbar spine.   Pulses:  R and L carotid,radial,dorsalis pedis and posterior tibial pulses are full and equal  bilaterally Extremities:  No clubbing, cyanosis, edema, or deformity noted with normal full range of motion of all joints.   Neurologic:  alert & oriented X3 and DTRs symmetrical and normal.   Skin:  Intact without suspicious lesions or rashes Cervical Nodes:  No lymphadenopathy noted Axillary Nodes:  No palpable lymphadenopathy Psych:  memory intact for recent and remote, normally interactive, good eye contact, not anxious appearing, and not depressed appearing.     Impression & Recommendations:  Problem # 1:  ROUTINE GENERAL MEDICAL EXAM@HEALTH  CARE FACL (ICD-V70.0)  Orders: EKG w/ Interpretation (93000) Venipuncture (16109) TLB-Lipid Panel (80061-LIPID) TLB-BMP (Basic Metabolic Panel-BMET) (80048-METABOL) TLB-CBC Platelet - w/Differential (85025-CBCD) TLB-Hepatic/Liver Function Pnl (80076-HEPATIC) TLB-TSH (Thyroid Stimulating Hormone) (84443-TSH) T-Vitamin D (25-Hydroxy) ([address]11)  Problem # 2:  HYPERCALCEMIA (ICD-275.42)  Orders: Venipuncture (91478)  Problem # 3:  HYPERTENSION, ESSENTIAL NOS (ICD-401.9)  controlled Her updated medication list for this problem includes:    Lisinopril 5 Mg Tabs (Lisinopril) .Marland Kitchen... 1 by mouth once daily    Spironolactone 25 Mg Tabs (Spironolactone) .Marland Kitchen... 1 tab by mouth once daily as needed for edema  Orders: Venipuncture (29562)  Problem # 4:  HYPERLIPIDEMIA (ICD-272.2)  Her updated medication list for this problem includes:    Vytorin 10-20 Mg Tabs (Ezetimibe-simvastatin) .Marland Kitchen... 1 by mouth qhs  Orders: Venipuncture (13086)  Problem # 5:  VITAMIN D DEFICIENCY (ICD-268.9)  Orders: Venipuncture (57846)  Complete Medication List: 1)  Lisinopril 5 Mg Tabs (Lisinopril) .Marland Kitchen.. 1 by mouth once daily 2)  Cyclobenzaprine Hcl 10 Mg Tabs (Cyclobenzaprine hcl) .Marland KitchenMarland KitchenMarland Kitchen  1/2 -1 at bedtime as needed 3)  Omega Three  .... Once daily 4)  Vytorin 10-20 Mg Tabs (Ezetimibe-simvastatin) .Marland Kitchen.. 1 by mouth qhs 5)  Spironolactone 25 Mg Tabs  (Spironolactone) .Marland Kitchen.. 1 tab by mouth once daily as needed for edema  Lipid Assessment/Plan:      Based on NCEP/ATP III, the patient's risk factor category is "0-1 risk factors".  The patient's lipid goals are as follows: Total cholesterol goal is 200; LDL cholesterol goal is 130; HDL cholesterol goal is 40; Triglyceride goal is 150.  Her LDL cholesterol goal has been met.  Secondary causes for hyperlipidemia have been ruled out.  She has been counseled on adjunctive measures for lowering her cholesterol and has been provided with dietary instructions.    Patient Instructions: 1)  Please obtain complete BMD report. Prescriptions: LISINOPRIL 5 MG TABS (LISINOPRIL) 1 by mouth once daily  #90 x 3   Entered and Authorized by:   Marga Melnick MD   Signed by:   Marga Melnick MD on 05/06/2010   Method used:   Print then Give to Patient   RxID:   [id number]     Appended Document: CPX AND FASTING LABS ///SPH

## 2010-11-11 NOTE — Progress Notes (Signed)
Summary: bp elevated  Phone Note Call from Patient Call back at Work Phone [phone]   Refills Requested: Medication #1:  LISINOPRIL 5 MG TABS 1 by mouth once daily  Medication #2:  SPIRONOLACTONE 25 MG TABS 1 TAB by mouth once daily as needed FOR EDEMA Caller: Patient Summary of Call: Pt left VM BP continue to be elevated even after increase her lisinopril 5 mg to 2 tab once daily. pt also c/o headache. pt denies any other symptoms. pt states that BP has been running 137/95, 128/87, 140/101.  pt use [facility]. Pt coming in for OV 06-20-10.pls advise.................Marland KitchenFelecia Deloach CMA  June 19, 2010 1:02 PM   Follow-up for Phone Call        increase Spironolactone to two times a day ; OV if BP not < 135/85 after 5-7 days Follow-up by: Marga Melnick MD,  June 19, 2010 2:48 PM  Additional Follow-up for Phone Call Additional follow up Details #1::        Patient aware of above instructions and will follow-up tomorrow . Patient informed to bring b/p cuff to appointment Additional Follow-up by: Shonna Chock CMA,  June 19, 2010 2:57 PM

## 2010-11-11 NOTE — Progress Notes (Signed)
Summary: Refill Request  Phone Note Call from Patient Call back at Home Phone [phone]   Caller: Patient Summary of Call: Message left on voicemail, patient lost rx for chlosterol med and would like it sent ot [facility]   Chrae [facility] CMA  August 06, 2010 2:28 PM     Prescriptions: CRESTOR 20 MG TABS (ROSUVASTATIN CALCIUM) 1 once daily  #30 x 2   Entered by:   Shonna Chock CMA   Authorized by:   Marga Melnick MD   Signed by:   Shonna Chock CMA on 08/06/2010   Method used:   Electronically to        [facility]* (retail)       [address] [place], [place]  [address]       Ph: [phone] or [phone]       Fax: [phone]   RxID:   [id number]

## 2011-01-12 LAB — HM PAP SMEAR: HM Pap smear: NORMAL

## 2011-01-29 ENCOUNTER — Ambulatory Visit (HOSPITAL_COMMUNITY): Payer: Self-pay

## 2011-01-29 ENCOUNTER — Other Ambulatory Visit (HOSPITAL_COMMUNITY): Payer: Self-pay | Admitting: Obstetrics and Gynecology

## 2011-01-29 DIAGNOSIS — Z1231 Encounter for screening mammogram for malignant neoplasm of breast: Secondary | ICD-10-CM

## 2011-02-23 ENCOUNTER — Ambulatory Visit (HOSPITAL_COMMUNITY)
Admission: RE | Admit: 2011-02-23 | Discharge: 2011-02-23 | Disposition: A | Discharge status: Home / Self Care | Payer: Managed Care, Other (non HMO) | Source: Ambulatory Visit | Attending: Obstetrics and Gynecology | Admitting: Obstetrics and Gynecology

## 2011-02-23 DIAGNOSIS — Z1231 Encounter for screening mammogram for malignant neoplasm of breast: Secondary | ICD-10-CM | POA: Insufficient documentation

## 2011-02-24 ENCOUNTER — Other Ambulatory Visit: Payer: Self-pay | Admitting: Obstetrics and Gynecology

## 2011-02-24 ENCOUNTER — Ambulatory Visit
Admission: RE | Admit: 2011-02-24 | Discharge: 2011-02-24 | Disposition: A | Discharge status: Home / Self Care | Payer: Managed Care, Other (non HMO) | Source: Ambulatory Visit | Attending: Obstetrics and Gynecology | Admitting: Obstetrics and Gynecology

## 2011-02-24 DIAGNOSIS — R928 Other abnormal and inconclusive findings on diagnostic imaging of breast: Secondary | ICD-10-CM

## 2011-02-24 NOTE — Assessment & Plan Note (Signed)
[facility]                            CARDIOLOGY OFFICE NOTE   Samantha, Stark                    MRN:          [id number]  DATE:06/08/2007                            DOB:          [birth year]    Samantha Stark is seen today as a new patient at the request of Dr. Alwyn Ren.  She is being evaluated for palpitations, atypical chest pain, dyspnea.  The patient has had the palpitations in the past, I believe back in 2006  she was hospitalized for monitoring and had a negative workup.  This was  the last time she had a Myoview.  The patient subsequently started  feeling ill about 6-8 weeks ago.  She has noticed increasing  palpitations that sound benign.  She had one episodes where she felt her  heart racing for about 3 hours.  This was somewhat different as prior to  this she had had flip flops that were more of a nuisance.  She has been  a little bit more anxious lately, she seems to be under a lot of stress  at work.  She also notices that caffeine makes her palpitations worse.  Dr. Alwyn Ren gave her anxiety pill which seemed to help and her yoga also  seems to help with the palpitations.   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  She is concerned about  increasing her exercise regimen in the face of her palpitations.   From a cardiac perspective she is otherwise stable.  She has never had  documented coronary disease, poor LV function, mitral valve prolapse or  valvular heart disease.  She is certainly not sedentary but has limited  her activities recently because of her palpitations.  She walks on a  regular basis.  She lives in [place] area and does outdoor work  and gardening as well.  The patient's coronary risk factors include  hypertension, hypercholesterolemia.  She is a nonsmoker, nondiabetic and  family history was remarkable for mother being alive at 38 and  father  dying at age 64 of liver disease.   The patient actually feels that her palpitations have improved over the  past week.  She apparently can not take beta blockers and calcium  channel blockers due to swelling in the ankles.  Dr. Alwyn Ren has not  adjusted her medicines recently but referred her here for them.  In  regards to her chest pain she has had atypical pain sometimes associated  with this palpitations, they are not necessarily exertional.  They are  sharp, they are left-sided, they can radiate to the left shoulder.  There is no relieving or aggravating factors.  They have not been  progressive and are not too concerning in regards to angina.  The  patient also has some mild chronic exertional dyspnea.  She feels that  she should be able to do more for her age, the dyspnea is related to her  palpitations and anxiety from time to time.  She is a nonsmoker and does  not have a history of lung disease, COPD, sleep apnea or other intrinsic  problems with oxygen exchange.   She has not had a cough, fever or sputum production.  There has been no  pleuritic component and no history of PE.   REVIEW OF SYSTEMS:  Otherwise negative.   Family History non contributory   PAST MEDICAL HISTORY:  1. Previous tonsillectomy.  2. Hysterectomy.  3. Laminectomy L5-S1.  4. Tubal ligation.  5. She has hypertension.  6. Hyperlipidemia.  7. Propensity towards hypokalemia.  8. Recent diagnosis of hypercalcemia.   MEDICATIONS:  1. Lisinopril 20 a day.  2. Vytorin 10/20.  3. Spironolactone 25 b.i.d.  4. CombiPatch 50/40 two patches a week.   SHE HAS MULTIPLE ALLERGIES INCLUDING CODEINE, KEFLEX, ERYTHROMYCIN, BETA  BLOCKER, CALCIUM BLOCKERS, SHELLFISH AND OYSTERS.   She is married.  She has older children who are doing well.  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  She drinks a glass of wine 2-3 times per week and has 3 cups of coffee  which  tend to be strong Starbucks brand type coffee a day.  She enjoys  doing Yoga.   EXAMINATION:  Remarkable for a healthy-appearing middle-aged white  female in no distress.  Skin is somewhat pale.  Affect is appropriate.  Her weight is 160, blood pressure is equal to 130/70, pulse 78 and  regular, respiratory rate is 14, she is afebrile.  HEENT:  Normal. Carotids normal without bruit.  There is no  lymphadenopathy, no thyromegaly and no JVP elevation.  LUNGS:  Clear, good diaphragmatic motion, no wheezing.  There is an S1 and S2 with a soft systolic ejection murmur.  The PMI is  normal.  ABDOMEN:  Benign, there is no renal bruits, no AAA and no tenderness.  Bowel sounds positive, no hepatosplenomegaly, hepatojugular reflux.  Femorals were +4 bilaterally without bruit, PTs are +3.  There is no  lower extremity edema.  NEURO:  Nonfocal.  SKIN:  Warm and dry.  No muscular weakness.   Her EKG is totally normal.   IMPRESSION:  1. Palpitations, not clear to me why she is having these, clearly they      are related to anxiety and caffeine intake.  She will try to      resolve this with Dr. Alwyn Ren and cut back on her caffeine intake.      She apparently is INTOLERANT TO CALCIUM BLOCKERS AND BETA BLOCKERS      and we will not use these.  She will have a CardioNet monitor for 4      weeks to confirm that these are benign premature atrial      contractions or premature ventricular contractions.  2. Chest pain is atypical but the patient is scared to advance her      workouts.  Her last Myoview was in 2006, I think it is reasonable      to repeat it.  We will get her on a treadmill and make sure there      is no exercise induced arrhythmias.  If she passes her stress test      she is much more likely to have less anxiety about her physical      activity levels.  3. History of hypertension, continue on lisinopril and spironolactone.      Apparently she has a propensity towards low potassium, I  do not      know if  this will need to be worked up by Dr. Alwyn Ren.  Blood      pressure is currently under good control.  4. Hyperlipidemia, continue Vytorin 10/20.  Apparently she had LDLs      that were lower than 50 and she requested to cut her Vytorin in      half.  Her LDL is now a 91 or so and since she has no documented      coronary disease this should be fine.  5. Anxiety and depression.  She seems to have a good deal of this.  I      will leave it up to Dr. Alwyn Ren as to whether he wants to prescribe      an SSRI.  6. Previous laminectomy, back pain is not that bad.  She can use      Tylenol or Motrin p.r.n.  She continues to do yoga to strengthen      her back.  Overall I think that she has had a good result from her      previous surgery.   I will see Kiyo back after she has had her monitor for 4-5 weeks and  her stress test.     Theron Arista C. Eden Emms, MD, [facility]  Electronically Signed    PCN/MedQ  DD: 06/08/2007  DT: 06/09/2007  Job #: [id number]   cc:   Titus Dubin. Alwyn Ren, MD,FACP,FCCP

## 2011-02-24 NOTE — Assessment & Plan Note (Signed)
[facility]                            CARDIOLOGY OFFICE NOTE   Samantha Stark, Samantha Stark                    MRN:          [id number]  DATE:08/23/2007                            DOB:          [birth year]    Samantha Stark returns today for followup.  I have seen her for atypical chest  pain, hypertension, hypercholesterolemia, and palpitations.  Since I  last saw the patient, she had a normal Myoview.  She was able to  exercise about eight minutes.  She had no ischemia and an EF of 74%.  Since I last saw her, she weaned herself off caffeine and essentially  cured herself.  She has not had any recurrent chest pain or  palpitations.  She has mild chronic low level exertional dyspnea which  seems functional.   Her palpitations have gone away.  There has been no syncope.  She feels  much better.  She had multiple questions today regarding her  cholesterol.  Unfortunately, I do not have a recent number.  Back in  2006, her LDL was 95.  She is on half of 10/20 Vytorin.  I told her to  talk to Dr. Alwyn Ren because red yeast may be suitable for her since she  has no documented coronary disease and does not want to take a pill for  the rest of her life; however, her blood pressure does need control, and  she will continue her lisinopril and spironolactone.   REVIEW OF SYSTEMS:  Otherwise negative.   CURRENT MEDICATIONS:  1. Lisinopril 20 a day.  2. Vytorin 10/20 1/2 tablet daily.  3. Spironolactone 25 b.i.d.  4. Hormone CombiPatch.   PHYSICAL EXAMINATION:  Her exam is remarkable for a healthy-appearing  middle-aged white female in no distress.  Weight is 170.  Blood pressure is 110/72, pulse 84 and regular.  Afebrile.  Respiratory rate 14.  No bruits.  No lymphadenopathy, no JVD elevation.  LUNGS:  Clear with good diaphragmatic motion.  No wheezing.  There is an S1 and S2 with normal heart sounds.  PMI is normal.  ABDOMEN:  Benign.  No tenderness.  Bowel sounds  are positive.  No  hepatosplenomegaly, hepatojugular reflux.  No bruits.  Distal pulses are intact.  No edema.  NEURO:  Nonfocal.  SKIN:  Warm and dry.  No muscular weakness.   IMPRESSION:  1. Previous palpitations, resolved off caffeine.  No evidence of      significant arrhythmia.  No evidence of structural heart disease.      Continue to avoid caffeine.  2. Hypertension:  Continue lisinopril and spironolactone.  Follow up      with Dr. Alwyn Ren.  3. Hyperlipidemia:  Consider switching to red yeast rice and follow up      lipid and liver profile.  She is currently on half a pill of low      dose Vytorin.  4. Previous atypical chest pain:  Normal Myoview on June 17, 2007.      No evidence of significant coronary artery disease.  Follow up      p.r.n.     Theron Arista  Phillips Hay, MD, [facility]  Electronically Signed    PCN/MedQ  DD: 08/23/2007  DT: 08/23/2007  Job #: [id number]

## 2011-02-27 NOTE — Letter (Signed)
December 29, 2006    Suzanna Obey, M.D.  [address] [place], [place] [address]   RE:  ALVARETTA, EISENBERGER  MRN:  [id number]  /  DOB:  1956/06/03   Dr. Jonny Ruiz:   You are scheduled to see Kriste Basque the end of April for what appears to be  eustachian tube dysfunction.   Apparently, she had a severe otitis 7 to 10 years ago and has had  recurrent infections since associated with some eustachian tube  dysfunction symptoms.   She describes intermittent lightheadedness without true vertigo. This  had been most noticeable with activities such as yoga. She will have  some throbbing in the area which will last hours localized to the right  pre-auricular area without radiation. She denies any associated signs of  rhinosinusitis such as facial pain, purulence or fever.   By history, she had a negative MRI in 2001 done because of similar  symptoms.   On exam, she has normal extraocular motion and field of vision. The  right tympanic membrane was slightly dull. With a tuning fork, there was  slight lateralizations in the right. There was no evidence clinically of  temporomandibular joint dysfunction.   Mucinex, intranasal Nasonex and the use of a Neti pot each morning was  recommended. Because of the associated headaches, gabapentin 100 mg  every 8 hours was recommended. Trytans were relatively contraindicated  due to hypertension.   Her past medical history includes:  1. Laminectomy in 1997.  2. Hysterectomy for adenomyosis in 1993.  3. Colonoscopy.  4. She has fibrocystic breast disease.  5. Dyslipidemia.  6. Hypertension.  7. Diverticulosis was found at colonoscopy.   She does not drink or smoke.   She is on:  1. Diovan 80 mg 1/2 daily.  2. Vytorin 10/20 daily.  3. Slow niacin 500 mg daily.  4. Lotensin 10 mg daily.  5. Spironolactone 25 mg daily.   SHE IS INTOLERANT OR ALLERGIC TO KEFLEX, CODEINE, E-MYCIN AND  AMLODIPINE. SHE HAD HYPOKALEMIA WITH A THIAZIDE DIURETIC.   I  appreciate your evaluation of these chronic recurrent problems.    Sincerely,      Titus Dubin. Alwyn Ren, MD,FACP,FCCP  Electronically Signed    WFH/MedQ  DD: 12/31/2006  DT: 12/31/2006  Job #: [id number]

## 2011-02-27 NOTE — Consult Note (Signed)
[facility]  Patient:    Samantha Stark, [facility]                  MRN: [id number] Proc. Date: 10/04/00 Adm. Date:  [id number] Disc. Date: [id number] Attending:  Ephriam Knuckles H CC:         Justine Null, M.D. [facility]   Consultation Report  ACCOUNT NUMBER:  [id number].  REASON FOR CONSULTATION:  Abdominal pain.  CLINICAL HISTORY:  This patient is a 55 year old woman who presented to Dr. Justine Null with about a 48-hour history of abdominal pain which began in the right lateral mid-abdomen and has remained basically mid-abdomen, a little bit maybe in the right lower quadrant.  It did not start periumbilically and move there, but actually began in that area.  It has been associated with some very mild nausea but no vomiting, fever or chills, no diarrhea, no urinary tract symptoms.  Because of her persistent symptoms, she had a gallbladder ultrasound done at [facility] today and the report on the chart is that was normal.  They looked at the right lower quadrant and saw no tubular structures.  In Dr. Henderson Cloud office, she was noted to have a white count of 13,000 and also to have trace of blood in her urine as well as a few wbcs and 2+ bacteria; he, however, was concerned about appendicitis and asked for our evaluation.  PAST MEDICAL HISTORY:  The patients past medical history is basically that of good health.  She has some mild hypertension and hypercholesterolemia and is on Pravachol, hydrochlorothiazide and Lotensin.  ALLERGIES:  She has no known allergies.  PAST SURGICAL HISTORY:  Her only prior surgery was a partial hysterectomy done for adenomyosis; she has also had back surgery but no other abdominal procedures.  As far as she knows, her appendix was not taken out at the time of the hysterectomy.  The patient does not smoke.  PHYSICAL EXAMINATION  GENERAL:  On exam, the patient is alert, in no acute distress, but appears a little  bit uncomfortable.  HEENT:  Head is normocephalic.  Eyes nonicteric.  Pupils are round and regular.  NECK:  Supple.  No masses or thyromegaly.  LUNGS:  Clear to auscultation.  HEART:  Regular.  No murmurs, rubs, or gallops.  ABDOMEN:  Soft, although she has some distinct mild right lower quadrant tenderness and possibly a little bit of early rebound in the right lower quadrant but this a questionable finding.  Bowel sounds are present and active.  PELVIC:  Not done.  RECTAL:  Not done.  EXTREMITIES:  No cyanosis or edema.  IMAGING FINDINGS:  Her CT scan is basically negative.  I have reviewed this personally with the radiologist, Dr. Dwyane Luo. Fischer.  What appears to be a normal appendix is visualized in the right lower quadrant.  There is no inflammatory process in the right lower quadrant and cecum appears to be normal.  There is no free pelvic fluid.  There is no evidence of any obstructions and there is contrast in the colon.  There is a small left ovarian cyst noted incidentally.  IMPRESSION:  Right abdominal pain of uncertain etiology, possibly urinary in origin, given she has a little bit of flank pain, although not significant flank tenderness, and an abnormal urinalysis.  This could also be a mild early appendicitis, although she is now 48 hours since the onset of symptoms without significant progression.  PLAN:  Will get a clean-catch urine  tonight for culture and empirically start her on some Cipro 500 mg b.i.d. for five days and have her follow up with [place] if her pain gets worse or also with her medical physicians, Dr. Titus Dubin. Hopper and/or Dr. Everardo All.  I have put in a page for Dr. Alwyn Ren to discuss the situation with him so he will be aware of her disposition from the emergency department. DD:  10/04/00 TD:  10/05/00 Job: [id number]

## 2011-02-27 NOTE — H&P (Signed)
NAMEPINKEY, MCJUNKIN             ACCOUNT NO.:  [id number]   MEDICAL RECORD NO.:  [id number]          PATIENT TYPE:  INP   LOCATION:  0373                         FACILITY:  [facility]   PHYSICIAN:  Titus Dubin. Alwyn Ren, M.D. Romualdo Bolk OF BIRTH:  Sep 17, 1956   DATE OF ADMISSION:  11/12/2004  DATE OF DISCHARGE:                                HISTORY & PHYSICAL   Samantha Stark is a 55 year old white female who had a regularly scheduled  appointment for a medication refill November 12, 2004.  When she came in,  she stated that she had been having chest pain described as a tightness  since the evening of November 11, 2004.  It became approximately 8 p.m. and  was intermittent over the next 18 hours.  It would last several minutes.  It  was substernal without radiation and without associated nausea or  diaphoresis.  It was nonexertional.  It was not related to meals or  position.  She had been having night sweats but this is attributed to  hormonal deficiency for which she is scheduled to see Dr. Vincente Poli actually  November 12, 2004 in the afternoon.   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  She averages flying long distances every other week.  She  also has had to travel to be with her children who have had two preemies in  two years.  She frankly admits to being exhausted.   PAST MEDICAL HISTORY:  Her past medical history includes a laminectomy at L4-  S1 in 1997.  In 1993, she had a hysterectomy for adenomyosis.  Apparently,  the ovaries were left.  She has had a tonsillectomy.  She had a colonoscopy  in 1998 which was negative.  She has had calcific deposits in her breast.  Medical problems include hyperlipidemia and hypertension.  Apparently the  colonoscopy did show diverticulosis but she has had no significant problems.   FAMILY HISTORY:  Positive for alcoholism, colon cancer, diabetes, emphysema,  stroke, myocardial infarction, congestive  heart failure, and additionally  there is thyroid disease, hyperlipidemia.   She has never smoked and does not drink.   She is intolerant or allergic to Ultram, Keflex, codeine, E-Mycin and  calcium channel blockers, specifically amlodipine.   She is on:  1.  Lotensin 10 mg daily.  2.  Lipitor 40 mg daily.  3.  Maxzide 37.5/25 one-half daily.  4.  She takes Allegra and Flexeril as needed.   REVIEW OF SYSTEMS:  Includes the occasional irregular heart rhythms.  She  has had insomnia for months that she attributes to the hot flashes.   She exercises minimally.  She eats out a lot, particularly at airports  because of her extensive travel.   PHYSICAL EXAMINATION:  VITAL SIGNS:  She is 5 feet 2 inches, and weighs 169.  Pulse is 56, respiratory rate is 16, blood pressure 130/86.  HEENT:  Pupils were dilated but responsive to light.  She had slight  arterial narrowing.  Otolaryngologic exam was unremarkable.  CARDIOPULMONARY:  Exam revealed a click at the apex.  Denna Haggard' sign was  negative.  All pulses were intact.  She had no edema.  ABDOMEN:  Nontender with no organomegaly.  She had no lymphadenopathy.  NEUROPSYCHIATRIC:  There were no localizing neuropsychiatric findings  although her affect was somewhat flat.  I asked her about depression but she  stated that she was just tired.  MUSCULOSKELETAL:  Exam was grossly normal.   EKG was normal.   She was admitted to telemetry because of the chest tightness which she was  experiencing in the office.  She was given three baby aspirin and  arrangements for transport.   The cardiology physician's assistant was contacted for follow up.   In addition, the cardiac enzymes, lipid and liver profiles will be checked.  Her last lipid profile was Feb 18, 2004.  At that time, cholesterol was 216,  HDL 44, and direct LDL 155.  Dietary intervention has been recommended in  addition to the statin.  This has not really been implemented because of  the  travel issues noted above.  Significantly in December of 2004, her total  cholesterol was 173 and the LDL 121.  Consideration would be given to  changing the lipid agent if the lipids remain elevated.  This would  constitute an observation admission if the initial cardiac enzymes are  negative as a stress Cardiolite could be completed prior to discharge.   She had previously been evaluated for cardiac disease.  Outpatient work up  was not felt to be indicated at this time because of the active ongoing  symptoms which may reflect cardiac disease.  The stress test of December 29, 2001 was a Cardiolite and was negative with nondiagnostic EKG changes.      WFH/MEDQ  D:  11/13/2004  T:  11/13/2004  Job:  [id number]   cc:   Marcelino Duster L. Vincente Poli, M.D.  [address] [place]  [place] [address]  Fax: [phone]

## 2011-02-27 NOTE — Procedures (Signed)
[facility]  Patient:    Samantha Stark, Samantha Stark Visit Number: [id number] MRN: [id number]          Service Type: OUT Location: RAD Attending Physician:  Feliciana Rossetti Dictated by:   Rozell Searing, P.A. Proc. Date: 12/29/01 Admit Date:  12/29/2001   CC:         Marga Melnick, M.D.   Stress Test  PROCEDURE:  Exercise stress Cardiolite test  CARDIOLOGIST:  Valera Castle, M.D.  INDICATIONS:  [name] is a 55 year old female, with no known cardiac history, with reported negative exercise Cardiolite some three years ago, now referred by Dr. Marga Melnick for an evaluation of nonexertional chest discomfort.  The patient has a history of a mitral valve prolapse.  Cardiac risk factors are notable for dyslipidemia and hypertension.  DESCRIPTION OF PROCEDURE:  The patient exercised for a total of 8 minutes and 35 seconds on the standard Bruce protocol.  The test was discontinued secondary to fatigue.  The patient denied any chest discomfort throughout the stress phase.  The heart rate rose from 81 baseline to 185 maximum (106% of PMHR, 7.1 METS).  The blood pressure was 110/70 baseline to a maximum of 170/76.  Serial electrocardiogram tracings notable for approximately 1.0 mm upsloping ST depression in lead II, lead III, and aVF, with a return to baseline by 4-minute mark of recovery.  CONCLUSION:  Clinically negative optimal graded exercise test, nondiagnostic electrocardiogram changes inferiorly, with Cardiolite images pending. Dictated by:   Rozell Searing, P.A. Attending Physician:  Feliciana Rossetti DD:  12/29/01 TD:  12/30/01 Job: [id number]

## 2011-02-27 NOTE — Consult Note (Signed)
NAMEMINDIE, RAWDON             ACCOUNT NO.:  [id number]   MEDICAL RECORD NO.:  [id number]          PATIENT TYPE:  INP   LOCATION:  0373                         FACILITY:  [facility]   PHYSICIAN:  Willa Rough, M.D.     DATE OF BIRTH:  [birth year]   DATE OF CONSULTATION:  DATE OF DISCHARGE:                                   CONSULTATION   REFERRING PHYSICIAN:  Dr. Marga Melnick.   REASON FOR CONSULTATION:  Ms. Hainline is a very pleasant, 55 year old female  with no known history of coronary artery disease, with cardiac risk factors  notable for hypertension, hypercholesterolemia, and with history of chest  pain with a normal exercise Cardiolite and normal 2-D echocardiogram in  2003.  She is now admitted directly by Dr. Alwyn Ren from his office, for  further evaluation of chest pain.   The patient presented earlier today for a scheduled follow-up visit.  She  reported experiencing some brief, left-sided chest discomfort last evening  while waiting at the [place] airport on a lay-over from [place]. She  reported being under considerable stress and felt a squeezing sensation in  her left upper chest which lasted approximately 15 minutes.  There was no  radiation, associated dyspnea, diaphoresis or nausea.  This was also  unrelated to any strenuous activity or brisk walking. While waiting in Dr.  Frederik Pear office today, the patient had recurrent chest discomfort, of  similar intensity, but lasting approximately 1-1/2 hours in duration.  She  was treated with aspirin and an electrocardiogram was normal.  The patient  reports no recurrent pain since admission. She also reports no remote or  recent history of exertional chest discomfort or exertional dyspnea.  She  states that these pains are similar to those she experienced 3 years ago,  again, under considerable stress, and evaluated with a normal stress test  and echocardiogram.   ALLERGIES:  KEFLEX, CODEINE, ULTRAM, ERYTHROMYCIN AND  CALCIUM CHANNEL  BLOCKERS.   MEDICATIONS PRIOR TO ADMISSION:  1.  Lotensin 10 daily.  2.  Maxzide 37.5/25 mg, 1/2 tablet daily.  3.  Lipitor 40 daily.   PAST MEDICAL HISTORY:  Hypertension, hypercholesterolemia, status post  lumbar laminectomy, hysterectomy.   SOCIAL HISTORY:  Patient lives here in [place] with her husband.  She  works with health care software and travels frequently. She has 2 grown  children.  She has never smoked tobacco, she drinks on average 1 glass of  wine per evening.   FAMILY HISTORY:  Mother age 33, no known heart disease. Father deceased in  his 31s, complications for alcohol abuse.  The patient has 3 siblings, none  of whom have heart disease.   REVIEW OF SYSTEMS:  Denies exertional chest pain, dyspnea, orthopnea, PND,  pedal edema or tachy-palpitations.  Denies symptoms suggestive of  gastroesophageal reflux disease.  No recent fever, chills or productive  cough.  Remaining systems negative.   PHYSICAL EXAMINATION:  VITAL SIGNS:  Blood pressure 120/80, pulse 80s and  regular, respirations 18, temperature afebrile, SAO2 97% (room air).  GENERAL:  A 55 year old female in no apparent distress.  HEENT:  Normocephalic, atraumatic.  NECK:  Preserved bilateral carotid pulses without bruits.  LUNGS:  Clear to auscultation in all fields.  CARDIOVASCULAR:  Regular rate and rhythm (S1/S2).  No murmurs, rubs or  gallops.  ABDOMEN:  Soft, nontender.  Intact bowel sounds without bruits.  EXTREMITIES:  Preserved bilateral femoral pulses without bruits; intact  distal pulses with no pedal edema.  NEUROLOGIC:  No focal deficit.   LABORATORY DATA:  Pending.   IMPRESSION:  1.  Atypical chest pain.      1.  Normal exercise Cardiolite March 2003.  2.  Multiple cardiac risk factors.      1.  Hypertension.      2.  Hypercholesterolemia.   PLAN:  We agree with keeping patient for overnight observation with cycling  of cardiac markers to rule out myocardial  infarction.  In addition to  routine labs, we recommend checking fasting lipid profile in the morning and  repeating a 12-lead electrocardiogram.  If serial cardiac markers are  normal, the recommendation is to proceed with further testing with an  outpatient exercise stress Myoview.  This has been scheduled for tomorrow,  February 2 at 1:30 p.m., at our cardiology office.  Patient is agreeable  with this patient and is willing to proceed.   The patient was seen and examined in conjunction with Dr. Willa Rough.      GS/MEDQ  D:  11/12/2004  T:  11/12/2004  Job:  [id number]   cc:   Titus Dubin. Alwyn Ren, M.D. [facility]

## 2011-02-27 NOTE — Discharge Summary (Signed)
NAMEIKRAM, RIEBE             ACCOUNT NO.:  [id number]   MEDICAL RECORD NO.:  [id number]          PATIENT TYPE:  INP   LOCATION:  0373                         [facility]ACILITY[facility]  [facility]   PHYSICIAN:  Gordy Savers, M.D. LHCDATE OF BIRTH:  12/05/1955   DATE OF ADMISSION:  11/12/2004  DATE OF DISCHARGE:  11/13/2004                                 DISCHARGE SUMMARY   FINAL DIAGNOSIS:  Atypical chest pain.   ADDITIONAL DIAGNOSES:  1.  Hypertension.  2.  Hyperlipidemia.   HISTORY OF PRESENT ILLNESS:  The patient is a 55 year old white female  without known prior cardiac disease. She had had a prior negative Cardiolite  stress test in 2003. She was admitted from the office for evaluation of  chest pain. The pain was described as dull in the left upper chest area  lasting approximately 25 minutes. There is some radiation to the left  shoulder area.   LABORATORY DATA/HOSPITAL COURSE:  The patient was admitted to the hospital  were EKG was obtained and was normal. Serial cardiac enzymes likewise were  obtained and were negative. Serial EKGs remained normal and she remained  pain free. During the hospital period she was seen in consultation by  cardiology.   DISPOSITION:  She will be discharged on her preadmission medications. Later  today an outpatient Cardiolite stress test has been scheduled. Will follow  up with her primary care physician, Dr. Marga Melnick.      PFK/MEDQ  D:  11/13/2004  T:  11/13/2004  Job:  [id number]

## 2011-04-04 ENCOUNTER — Other Ambulatory Visit: Payer: Self-pay | Admitting: Internal Medicine

## 2011-06-19 ENCOUNTER — Other Ambulatory Visit (HOSPITAL_COMMUNITY): Payer: Self-pay | Admitting: Orthopedic Surgery

## 2011-06-19 DIAGNOSIS — R2240 Localized swelling, mass and lump, unspecified lower limb: Secondary | ICD-10-CM

## 2011-07-03 ENCOUNTER — Ambulatory Visit (HOSPITAL_COMMUNITY)
Admission: RE | Admit: 2011-07-03 | Discharge: 2011-07-03 | Disposition: A | Discharge status: Home / Self Care | Payer: Managed Care, Other (non HMO) | Source: Ambulatory Visit | Attending: Orthopedic Surgery | Admitting: Orthopedic Surgery

## 2011-07-03 DIAGNOSIS — D1779 Benign lipomatous neoplasm of other sites: Secondary | ICD-10-CM | POA: Insufficient documentation

## 2011-07-03 DIAGNOSIS — R609 Edema, unspecified: Secondary | ICD-10-CM | POA: Insufficient documentation

## 2011-07-03 DIAGNOSIS — R2240 Localized swelling, mass and lump, unspecified lower limb: Secondary | ICD-10-CM

## 2011-07-03 LAB — CREATININE, SERUM: Creatinine, Ser: 0.7 mg/dL (ref 0.50–1.10)

## 2011-07-03 MED ORDER — GADOBENATE DIMEGLUMINE 529 MG/ML IV SOLN
15.0000 mL | Freq: Once | INTRAVENOUS | Status: AC | PRN
  Administered 2011-07-03: 15 mL via INTRAVENOUS

## 2011-09-24 ENCOUNTER — Other Ambulatory Visit: Payer: Self-pay | Admitting: Obstetrics and Gynecology

## 2011-09-24 DIAGNOSIS — N63 Unspecified lump in unspecified breast: Secondary | ICD-10-CM

## 2011-11-17 ENCOUNTER — Ambulatory Visit
Admission: RE | Admit: 2011-11-17 | Discharge: 2011-11-17 | Disposition: A | Discharge status: Home / Self Care | Payer: Managed Care, Other (non HMO) | Source: Ambulatory Visit | Attending: Obstetrics and Gynecology | Admitting: Obstetrics and Gynecology

## 2011-11-17 DIAGNOSIS — N63 Unspecified lump in unspecified breast: Secondary | ICD-10-CM

## 2012-01-22 ENCOUNTER — Ambulatory Visit: Payer: Managed Care, Other (non HMO) | Admitting: Internal Medicine

## 2012-01-25 ENCOUNTER — Other Ambulatory Visit (HOSPITAL_COMMUNITY): Payer: Self-pay | Admitting: Endocrinology

## 2012-01-25 DIAGNOSIS — E049 Nontoxic goiter, unspecified: Secondary | ICD-10-CM

## 2012-01-26 ENCOUNTER — Ambulatory Visit (HOSPITAL_COMMUNITY)
Admission: RE | Admit: 2012-01-26 | Discharge: 2012-01-26 | Disposition: A | Discharge status: Home / Self Care | Payer: Managed Care, Other (non HMO) | Source: Ambulatory Visit | Attending: Endocrinology | Admitting: Endocrinology

## 2012-01-26 ENCOUNTER — Encounter: Payer: Self-pay | Admitting: Internal Medicine

## 2012-01-26 ENCOUNTER — Ambulatory Visit (INDEPENDENT_AMBULATORY_CARE_PROVIDER_SITE_OTHER): Payer: Managed Care, Other (non HMO) | Admitting: Internal Medicine

## 2012-01-26 ENCOUNTER — Other Ambulatory Visit: Payer: Self-pay | Admitting: Internal Medicine

## 2012-01-26 VITALS — BP 122/80 | HR 72 | Temp 98.0°F | Wt 173.0 lb

## 2012-01-26 DIAGNOSIS — T7802XA 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 (crustaceans), initial encounter: Secondary | ICD-10-CM | POA: Insufficient documentation

## 2012-01-26 DIAGNOSIS — R140 Abdominal distension (gaseous): Secondary | ICD-10-CM

## 2012-01-26 DIAGNOSIS — I10 Essential (primary) hypertension: Secondary | ICD-10-CM

## 2012-01-26 DIAGNOSIS — M898X9 Other specified disorders of bone, unspecified site: Secondary | ICD-10-CM

## 2012-01-26 DIAGNOSIS — M949 Disorder of cartilage, unspecified: Secondary | ICD-10-CM

## 2012-01-26 DIAGNOSIS — E559 Vitamin D deficiency, unspecified: Secondary | ICD-10-CM

## 2012-01-26 DIAGNOSIS — E049 Nontoxic goiter, unspecified: Secondary | ICD-10-CM | POA: Insufficient documentation

## 2012-01-26 DIAGNOSIS — R143 Flatulence: Secondary | ICD-10-CM

## 2012-01-26 MED ORDER — SPIRONOLACTONE 25 MG PO TABS: 25.0000 mg | ORAL_TABLET | ORAL | Status: DC | PRN

## 2012-01-26 MED ORDER — LISINOPRIL 10 MG PO TABS: 10.0000 mg | ORAL_TABLET | Freq: Every day | ORAL | Status: DC

## 2012-01-26 MED ORDER — CELECOXIB 200 MG PO CAPS: 200.0000 mg | ORAL_CAPSULE | Freq: Every day | ORAL | Status: DC | PRN

## 2012-01-26 NOTE — Assessment & Plan Note (Signed)
01/22/12: vit D 29.1 by Dr Talmage Nap

## 2012-01-26 NOTE — Telephone Encounter (Signed)
Per Dr.Hopper, options are limited due to codeine allergy. Celebrex 200 mg 1 by mouth daily as needed #15, no refills

## 2012-01-26 NOTE — Patient Instructions (Signed)
.  Share results with  All MDs  seen 

## 2012-01-26 NOTE — Telephone Encounter (Signed)
Pt states dr hopper discussed giving her some pain meds. Pt does not know the name, but would like it called into [facility]

## 2012-01-26 NOTE — Progress Notes (Signed)
  Subjective:    Patient ID: Samantha Stark, female    DOB: 07-24-1956, 56 y.o.   MRN: [id number]  HPI She describes bone pain for approximately 4 weeks which is progressive. The last 4 days she's noted pain in the posterior rib cage. Prior to that it was in her shins and left forearm.  The pain has been as bad as a "8" on a 10 scale. It will last hours. Hydrocodone has helped but causes itching without rash.  No definite exacerbating factors.  Her endocrinologist performed PTH, calcium, vitamin D level on 01/22/12. The vitamin D level was slightly reduced at 29.1.  A BMET was performed in [facility] 01/21/12. Calcium at that time was 10.9, mildly elevated. Creatinine was 0.8, BUN 17, and potassium 3.9.    Review of Systems  She denies significant dysphagia, hoarseness, or swallowing issues. She has had some bloating for approximately one month for which she take probiotics. She denies pelvic pain or gynecologic bleeding. She is due for her gynecologic followup She's had no unexplained weight loss, abdominal pain, melena, rectal bleeding. She denies any cough, sputum production or dyspnea.     Objective:   Physical Exam  Gen.: Healthy and well-nourished in appearance. Alert, appropriate and cooperative throughout exam. Head: Normocephalic without obvious abnormalities Eyes: No corneal or conjunctival inflammation noted.  Extraocular motion intact. No icterus  Mouth: Oral mucosa and oropharynx reveal no lesions or exudates. Teeth in good repair. Neck: No deformities, masses, or tenderness noted. Range of motion & Thyroid normal Lungs: Normal respiratory effort; chest expands symmetrically. Lungs are clear to auscultation without rales, wheezes, or increased work of breathing.  Chest: No pain to compression of the thorax Heart: Normal rate and rhythm. Normal S1 and S2. No gallop, click, or rub. No murmur. Abdomen: Bowel sounds normal; abdomen soft and nontender. No masses,  organomegaly or hernias noted.  Musculoskeletal/extremities: Slight lordosis noted of  the thoracic spine. No clubbing, cyanosis, edema, or deformity noted. Range of motion  normal .Tone & strength  normal.Joints normal. Nail health  good. Is no tenderness to palpation over the shins or forearm. Vascular: Carotid, radial artery, dorsalis pedis and  posterior tibial pulses are full and equal. No bruits present. Neurologic: Alert and oriented x3. Deep tendon reflexes symmetrical and normal.          Skin: Intact without suspicious lesions or rashes. Lymph: No cervical, axillary lymphadenopathy present. Psych: Affect is flat; she states she was profoundly tired from extensive travel in recent past.           Assessment & Plan:  #1 bone pain with normal calcium and PTH levels. The TSH was minimally elevated at 10.9:01/21/12 but normal with the PTH on 4/12.  #2 mildly reduced vitamin D level. This would be expected to cause myalgias, not bone pain.  #3 bloating x1 month in the context of travel and eating out. Gynecologic monitor due  #4 hypertension, well controlled. Normal creatinine and potassium on an ACE inhibitor and spironolactone as needed.  Plan: Vitamin D 2000 international units a day has been recommended. If the extremity symptoms resolve on this, no further evaluation is necessary. If the symptoms persist then  a bone scan could be performed. I do not feel it's indicated at this time in the absence of tenderness to deep palpation.  Calcium supplementation should be avoided  Certainly the bloating may be related to travel and change in eating habits, but I will verify gynecologic followup.

## 2012-01-27 ENCOUNTER — Ambulatory Visit (HOSPITAL_COMMUNITY): Payer: Managed Care, Other (non HMO)

## 2012-02-05 ENCOUNTER — Other Ambulatory Visit (HOSPITAL_COMMUNITY): Payer: Self-pay | Admitting: Endocrinology

## 2012-02-05 DIAGNOSIS — E049 Nontoxic goiter, unspecified: Secondary | ICD-10-CM

## 2012-02-26 ENCOUNTER — Other Ambulatory Visit: Payer: Self-pay | Admitting: Certified Nurse Midwife

## 2012-02-26 DIAGNOSIS — Z1231 Encounter for screening mammogram for malignant neoplasm of breast: Secondary | ICD-10-CM

## 2012-03-01 ENCOUNTER — Ambulatory Visit (HOSPITAL_COMMUNITY)
Admission: RE | Admit: 2012-03-01 | Discharge: 2012-03-01 | Disposition: A | Discharge status: Home / Self Care | Payer: Managed Care, Other (non HMO) | Source: Ambulatory Visit | Attending: Certified Nurse Midwife | Admitting: Certified Nurse Midwife

## 2012-03-01 DIAGNOSIS — Z1231 Encounter for screening mammogram for malignant neoplasm of breast: Secondary | ICD-10-CM | POA: Insufficient documentation

## 2012-03-15 ENCOUNTER — Encounter: Payer: Self-pay | Admitting: Internal Medicine

## 2012-03-15 ENCOUNTER — Ambulatory Visit (INDEPENDENT_AMBULATORY_CARE_PROVIDER_SITE_OTHER): Payer: Managed Care, Other (non HMO) | Admitting: Internal Medicine

## 2012-03-15 VITALS — BP 122/78 | HR 79 | Temp 98.0°F | Resp 12 | Ht 63.0 in | Wt 167.0 lb

## 2012-03-15 DIAGNOSIS — E782 Mixed hyperlipidemia: Secondary | ICD-10-CM

## 2012-03-15 DIAGNOSIS — T887XXA Unspecified adverse effect of drug or medicament, initial encounter: Secondary | ICD-10-CM

## 2012-03-15 DIAGNOSIS — Z0000 Encounter for general adult medical examination without abnormal findings: Secondary | ICD-10-CM

## 2012-03-15 DIAGNOSIS — M899 Disorder of bone, unspecified: Secondary | ICD-10-CM

## 2012-03-15 DIAGNOSIS — M8580 Other specified disorders of bone density and structure, unspecified site: Secondary | ICD-10-CM

## 2012-03-15 LAB — CBC WITH DIFFERENTIAL/PLATELET
Basophils Relative: 0.6 % (ref 0.0–3.0)
Eosinophils Relative: 1.5 % (ref 0.0–5.0)
HCT: 42.1 % (ref 36.0–46.0)
Lymphs Abs: 1 10*3/uL (ref 0.7–4.0)
Monocytes Relative: 11.6 % (ref 3.0–12.0)
Platelets: 240 10*3/uL (ref 150.0–400.0)
RBC: 4.62 Mil/uL (ref 3.87–5.11)
WBC: 4.4 10*3/uL — ABNORMAL LOW (ref 4.5–10.5)

## 2012-03-15 LAB — HEPATIC FUNCTION PANEL
ALT: 20 U/L (ref 0–35)
AST: 27 U/L (ref 0–37)
Bilirubin, Direct: 0 mg/dL (ref 0.0–0.3)
Total Bilirubin: 0.6 mg/dL (ref 0.3–1.2)

## 2012-03-15 LAB — LIPID PANEL
Cholesterol: 248 mg/dL — ABNORMAL HIGH (ref 0–200)
Total CHOL/HDL Ratio: 5

## 2012-03-15 NOTE — Patient Instructions (Signed)
Preventive Health Care: Exercise  30-45  minutes a day, 3-4 days a week. Walking is especially valuable in preventing Osteoporosis. Eat a low-fat diet with lots of fruits and vegetables, up to 7-9 servings per day.Consume less than 30 grams of sugar per day from foods & drinks with High Fructose Corn Syrup as # 1,2,3 or #4 on label. To prevent palpitations or premature beats, avoid stimulants such as decongestants, diet pills, nicotine, or caffeine (coffee, tea, cola, or chocolate) to excess.  Please try to go on My Chart within the next 24 hours to allow me to release the results directly to you.  

## 2012-03-15 NOTE — Progress Notes (Signed)
  Subjective:    Patient ID: Samantha Stark, female    DOB: 1956-06-13, 56 y.o.   MRN: [id number]  HPI  Samantha Stark  is here for a physical; she denies acute issues.      Review of Systems Her vitamin D level was low when seen by an endocrinologist in April. Vitamin D3 2000 international units daily was prescribed. This had presented as bone and muscle pain in her legs. Symptoms have essentially resolved on this dose She had been on Crestor 20 mg until September of 2012. It was stopped because of a pains in her legs.TSH & BMET were normal at that time. Lexapro 10 mg daily was prescribed by her gynecologist's office for situational anxiety     Objective:   Physical Exam Gen.: Healthy and well-nourished in appearance. Alert, appropriate and cooperative throughout exam. Head: Normocephalic without obvious abnormalities;  Hair very fine  Eyes: No corneal or conjunctival inflammation noted.Slight ptosis bilaterally. Pupils equal round reactive to light and accommodation. Fundal exam is benign without hemorrhages, exudate, papilledema. Extraocular motion intact. Vision grossly normal. Ears: External  ear exam reveals no significant lesions or deformities. Canals clear .TMs normal. Hearing is grossly normal bilaterally. Nose: External nasal exam reveals no deformity or inflammation. Nasal mucosa are pink and moist. No lesions or exudates noted.   Mouth: Oral mucosa and oropharynx reveal no lesions or exudates. Teeth in good repair. Neck: No deformities, masses, or tenderness noted. Range of motion & Thyroid normal Lungs: Normal respiratory effort; chest expands symmetrically. Lungs are clear to auscultation without rales, wheezes, or increased work of breathing. Heart: Normal rate and rhythm. Normal S1 and S2. No gallop, click, or rub.S4 w/o murmur. Abdomen: Bowel sounds normal; abdomen soft and nontender. No masses, organomegaly or hernias noted. Genitalia: as per Gyn  .                                                                                    Musculoskeletal/extremities: Minor lordosis  noted of  the thoracic  spine. No clubbing, cyanosis, edema, or deformity noted. Range of motion  normal .Tone & strength  normal.Joints normal. Nail health  good. Vascular: Carotid, radial artery, dorsalis pedis and  posterior tibial pulses are full and equal. No bruits present. Neurologic: Alert and oriented x3. Deep tendon reflexes symmetrical and normal.          Skin: Intact without suspicious lesions or rashes. Lymph: No cervical, axillary lymphadenopathy present. Psych: Mood and affect are normal. Normally interactive                                                                                         Assessment & Plan:  #1 comprehensive physical exam; no acute findings #2 see Problem List with Assessments & Recommendations Plan: see Orders

## 2012-03-16 LAB — VITAMIN D 25 HYDROXY (VIT D DEFICIENCY, FRACTURES): Vit D, 25-Hydroxy: 52 ng/mL (ref 30–89)

## 2012-06-08 ENCOUNTER — Telehealth: Payer: Self-pay | Admitting: Internal Medicine

## 2012-06-08 NOTE — Telephone Encounter (Signed)
Samantha Stark Gery Pray) husband,wants wife referred to Cardiology has already made appt. cb for pt is [phone]

## 2012-06-17 ENCOUNTER — Other Ambulatory Visit: Payer: Self-pay | Admitting: Internal Medicine

## 2012-06-17 DIAGNOSIS — I499 Cardiac arrhythmia, unspecified: Secondary | ICD-10-CM

## 2012-06-17 NOTE — Telephone Encounter (Signed)
Left message on voicemail for patient to return call when available   

## 2012-06-17 NOTE — Telephone Encounter (Signed)
Spoke with patient, patient states she would like this referral for episodes of irregular heartbeat and history of slight MVP. Patient with pending appointment with Nishan on 06/29/12 @ 9:45.

## 2012-06-29 ENCOUNTER — Ambulatory Visit (INDEPENDENT_AMBULATORY_CARE_PROVIDER_SITE_OTHER): Payer: Medicare HMO | Admitting: Cardiovascular Disease

## 2012-06-29 ENCOUNTER — Encounter: Payer: Self-pay | Admitting: Cardiovascular Disease

## 2012-06-29 VITALS — BP 130/85 | HR 74 | Ht 62.0 in | Wt 169.0 lb

## 2012-06-29 DIAGNOSIS — R002 Palpitations: Secondary | ICD-10-CM

## 2012-06-29 MED ORDER — PROPRANOLOL HCL 10 MG PO TABS: ORAL_TABLET | ORAL | Status: DC

## 2012-06-29 NOTE — Assessment & Plan Note (Signed)
Cholesterol is at goal.  Continue current dose of statin and diet Rx.  No myalgias or side effects.  F/U  LFT's in 6 months. Lab Results  Component Value Date   LDLCALC 98 05/03/2009             

## 2012-06-29 NOTE — Progress Notes (Signed)
Patient ID: Samantha Stark, female   DOB: 1955/10/29, 56 y.o.   MRN: [id number]  56 yo with history of benign palpitations.  Previous w/u about 4 years ago negative with w/u including normal myovue  Travels with medical software work.  3 weeks ago in [place] had 2 nights of rapid pounding and palpitations. Does Ta [employer] with no issues.  No syncope chest pain or dyspnea.  Notes lack of sleep and caffeine make palpitations more likely .  No history of thyroid disease.  Has not had bad episode since returning from travel.  Palpitaitons are rapid skips and flip flops    ROS: Denies fever, malais, weight loss, blurry vision, decreased visual acuity, cough, sputum, SOB, hemoptysis, pleuritic pain, palpitaitons, heartburn, abdominal pain, melena, lower extremity edema, claudication, or rash.  All other systems reviewed and negative   General: Affect appropriate Healthy:  appears stated age HEENT: normal Neck supple with no adenopathy JVP normal no bruits no thyromegaly Lungs clear with no wheezing and good diaphragmatic motion Heart:  S1/S2 no murmur,rub, gallop or click PMI normal Abdomen: benighn, BS positve, no tenderness, no AAA no bruit.  No HSM or HJR Distal pulses intact with no bruits No edema Neuro non-focal Skin warm and dry No muscular weakness  Medications Current Outpatient Prescriptions  Medication Sig Dispense Refill  . celecoxib (CELEBREX) 200 MG capsule Take 1 capsule (200 mg total) by mouth daily as needed.  15 capsule  0  . escitalopram (LEXAPRO) 10 MG tablet Take 10 mg by mouth daily.      Marland Kitchen lisinopril (PRINIVIL,ZESTRIL) 10 MG tablet Take 1 tablet (10 mg total) by mouth daily.  90 tablet  3  . spironolactone (ALDACTONE) 25 MG tablet Take 1 tablet (25 mg total) by mouth as needed.  90 tablet  0    Allergies Shrimp; Amlodipine besylate; Calcium channel blockers; Cephalexin; Codeine; and Hydrochlorothiazide w-triamterene  Family History: Family History  Problem  Relation Age of Onset  . Osteoporosis Maternal Grandmother   . Colon polyps Mother   . Hyperlipidemia Mother   . Colon polyps Brother   . Hyperlipidemia Brother     X2  . Colon polyps Maternal Aunt   . Alcohol abuse Father   . Hyperlipidemia Sister   . Heart disease Maternal Uncle     MI in 24s  . Stroke Maternal Grandfather     in 76s  . Goiter Mother     also Alene Mires    Social History: History   Social History  . Marital Status: Married    Spouse Name: N/A    Number of Children: N/A  . Years of Education: N/A   Occupational History  . Not on file.   Social History Main Topics  . Smoking status: Never Smoker   . Smokeless tobacco: Not on file  . Alcohol Use: 1.2 oz/week    2 Glasses of wine per week     wine socially  . Drug Use: No  . Sexually Active: Not on file   Other Topics Concern  . Not on file   Social History Narrative  . No narrative on file    Electrocardiogram:  NSR rte 82 PAC done 03/15/12  Assessment and Plan

## 2012-06-29 NOTE — Patient Instructions (Addendum)
Your physician has recommended that you wear an event monitor. Event monitors are medical devices that record the heart's electrical activity. Doctors most often [place] these monitors to diagnose arrhythmias. Arrhythmias are problems with the speed or rhythm of the heartbeat. The monitor is a small, portable device. You can wear one while you do your normal daily activities. This is usually used to diagnose what is causing palpitations/syncope (passing out).  Your physician has recommended you make the following change in your medication: START Inderal 10mg  as needed for palpitations up to 3 per day and not more often than every 6 hours.  Your physician recommends that you schedule a follow-up appointment in: 5-6 weeks

## 2012-06-29 NOTE — Assessment & Plan Note (Signed)
Well controlled.  Continue current medications and low sodium Dash type diet.    

## 2012-06-29 NOTE — Assessment & Plan Note (Signed)
Benign soundig PAC on ECG  Event monitor and PRN Inderal F/U oin 4-6 weeks

## 2012-06-30 ENCOUNTER — Ambulatory Visit: Payer: Medicare HMO

## 2012-07-01 ENCOUNTER — Encounter (INDEPENDENT_AMBULATORY_CARE_PROVIDER_SITE_OTHER): Payer: Managed Care, Other (non HMO)

## 2012-07-01 DIAGNOSIS — R002 Palpitations: Secondary | ICD-10-CM

## 2012-08-02 ENCOUNTER — Encounter: Payer: Self-pay | Admitting: Internal Medicine

## 2012-08-08 ENCOUNTER — Ambulatory Visit (HOSPITAL_COMMUNITY): Payer: Managed Care, Other (non HMO)

## 2012-08-22 ENCOUNTER — Encounter: Payer: Self-pay | Admitting: Internal Medicine

## 2012-08-23 ENCOUNTER — Other Ambulatory Visit: Payer: Self-pay | Admitting: Internal Medicine

## 2012-08-23 ENCOUNTER — Ambulatory Visit (HOSPITAL_COMMUNITY): Payer: Managed Care, Other (non HMO)

## 2012-08-23 DIAGNOSIS — M545 Low back pain: Secondary | ICD-10-CM

## 2012-08-23 MED ORDER — CYCLOBENZAPRINE HCL 5 MG PO TABS: ORAL_TABLET | ORAL | Status: DC

## 2012-08-23 NOTE — Telephone Encounter (Signed)
Dr.Hopper please advise for Flexeril is not on medication list

## 2012-08-26 ENCOUNTER — Ambulatory Visit: Payer: Medicare HMO | Admitting: Cardiovascular Disease

## 2012-09-13 ENCOUNTER — Telehealth: Payer: Self-pay | Admitting: *Deleted

## 2012-09-13 NOTE — Telephone Encounter (Signed)
LMTCB RE MONITOR PER DR NISHAN  NSR  NO ARRYTHMIAS .Zack Seal

## 2012-09-15 ENCOUNTER — Ambulatory Visit: Payer: Self-pay | Admitting: Cardiovascular Disease

## 2012-09-15 NOTE — Telephone Encounter (Signed)
LEFT WORD ON VOICE MAIL THAT  MONITOR  SHOWED  NSR WITH NO ARRYTHMIA'S  PER DR NISHAN./CY

## 2012-09-20 ENCOUNTER — Encounter: Payer: Self-pay | Admitting: Internal Medicine

## 2012-09-20 ENCOUNTER — Other Ambulatory Visit: Payer: Self-pay | Admitting: Internal Medicine

## 2012-09-20 DIAGNOSIS — F419 Anxiety disorder, unspecified: Secondary | ICD-10-CM

## 2012-09-20 MED ORDER — ESCITALOPRAM OXALATE 10 MG PO TABS: 10.0000 mg | ORAL_TABLET | Freq: Every day | ORAL | Status: DC

## 2012-11-26 ENCOUNTER — Other Ambulatory Visit: Payer: Self-pay

## 2012-12-12 ENCOUNTER — Other Ambulatory Visit: Payer: Self-pay | Admitting: Internal Medicine

## 2013-02-09 ENCOUNTER — Encounter: Payer: Self-pay | Admitting: Internal Medicine

## 2013-03-03 ENCOUNTER — Other Ambulatory Visit: Payer: Self-pay | Admitting: Internal Medicine

## 2013-03-17 ENCOUNTER — Encounter: Payer: Self-pay | Admitting: Internal Medicine

## 2013-03-24 ENCOUNTER — Encounter: Payer: Managed Care, Other (non HMO) | Admitting: Internal Medicine

## 2013-04-28 ENCOUNTER — Telehealth: Payer: Self-pay | Admitting: Internal Medicine

## 2013-04-28 NOTE — Telephone Encounter (Signed)
Patient Information:  Caller Name: Anetha  Phone: [phone]  Patient: Samantha Stark, Samantha Stark  Gender: Female  DOB: [birth year]  Age: 57 Years  PCP: Marga Melnick  Office Follow Up:  Does the office need to follow up with this patient?: No  Instructions For The Office: N/A  RN Note:  Hysterectomy.  BP 148/94 left arm at 2230 04/27/16;  Been ranging > 140/>90 for past 3-4 weeks.  Exercising with frequent walking, yoga, Jode[place] Do.  Advised to bring BP diary with her to 05/12/13 appointment. Offered and declined earlier appointment.  Symptoms  Reason For Call & Symptoms: Hypertension related to stress at work and weekly long distance travel. Has physical scheduled for 05/12/13.  Asking if should increase Lisinopril.  Reviewed Health History In EMR: Yes  Reviewed Medications In EMR: Yes  Reviewed Allergies In EMR: Yes  Reviewed Surgeries / Procedures: Yes  Date of Onset of Symptoms: 04/07/2013  Treatments Tried: yoga, exercise/walking  Treatments Tried Worked: No  Guideline(s) Used:  High Blood Pressure  Disposition Per Guideline:   See Within 2 Weeks in Office  Reason For Disposition Reached:   BP > 140/90 and is taking BP medications  Advice Given:  General:  Untreated high blood pressure may cause damage to the heart, brain, kidneys, and eyes.  The goal of blood pressure treatment for most patients with hypertension is to keep the blood pressure under 140/90.  Lifestyle Changes  Do 30 minutes of aerobic physical activity (e.g., brisk walking) most days of the week.  If you drink alcohol, you should limit your daily alcohol drinking. Women should have no more than one drink per day. Men should have no more than 2 drinks per day. A drink is defined as 1.5 oz hard liquor (one shot or jigger; 45 ml), 5 oz wine (small glass; 150 ml), or 12 oz beer (one can; 360 ml).  Call Back If:  Headache, blurred vision, difficulty talking, or difficulty walking occurs  Chest pain or difficulty  breathing occurs  You want to go in to the office for a blood pressure check  You become worse.  RN Overrode Recommendation:  Patient Already Has Appt, Document Patient  Previously scheduled for pysical on  05/12/13

## 2013-05-11 ENCOUNTER — Telehealth: Payer: Self-pay

## 2013-05-11 NOTE — Telephone Encounter (Signed)
rx came from [facility] for lexapro 10mg . Pt was due for aex 5/14 & does not have appt scheduled. Pt was last given this rx 9/13 #30 with 3 refills so not sure what she has been doing since this rx would not have lasted here until now. LMTCB

## 2013-05-11 NOTE — Telephone Encounter (Signed)
Routed to stephanie p

## 2013-05-12 ENCOUNTER — Encounter: Payer: Self-pay | Admitting: Internal Medicine

## 2013-05-12 ENCOUNTER — Ambulatory Visit (INDEPENDENT_AMBULATORY_CARE_PROVIDER_SITE_OTHER): Payer: Managed Care, Other (non HMO) | Admitting: Internal Medicine

## 2013-05-12 VITALS — BP 118/78 | HR 67 | Resp 12 | Ht 62.03 in | Wt 156.0 lb

## 2013-05-12 DIAGNOSIS — M545 Low back pain: Secondary | ICD-10-CM

## 2013-05-12 DIAGNOSIS — Z0000 Encounter for general adult medical examination without abnormal findings: Secondary | ICD-10-CM

## 2013-05-12 DIAGNOSIS — Z1331 Encounter for screening for depression: Secondary | ICD-10-CM

## 2013-05-12 DIAGNOSIS — M8580 Other specified disorders of bone density and structure, unspecified site: Secondary | ICD-10-CM

## 2013-05-12 DIAGNOSIS — M949 Disorder of cartilage, unspecified: Secondary | ICD-10-CM

## 2013-05-12 DIAGNOSIS — E782 Mixed hyperlipidemia: Secondary | ICD-10-CM

## 2013-05-12 DIAGNOSIS — I10 Essential (primary) hypertension: Secondary | ICD-10-CM

## 2013-05-12 LAB — CBC WITH DIFFERENTIAL/PLATELET
Basophils Relative: 1 % (ref 0.0–3.0)
Eosinophils Absolute: 0.1 10*3/uL (ref 0.0–0.7)
Eosinophils Relative: 2 % (ref 0.0–5.0)
HCT: 39.6 % (ref 36.0–46.0)
Hemoglobin: 13.4 g/dL (ref 12.0–15.0)
MCHC: 33.9 g/dL (ref 30.0–36.0)
MCV: 90 fl (ref 78.0–100.0)
Monocytes Absolute: 0.4 10*3/uL (ref 0.1–1.0)
Neutro Abs: 2.3 10*3/uL (ref 1.4–7.7)
RBC: 4.4 Mil/uL (ref 3.87–5.11)
WBC: 4.3 10*3/uL — ABNORMAL LOW (ref 4.5–10.5)

## 2013-05-12 LAB — TSH: TSH: 2.27 u[IU]/mL (ref 0.35–5.50)

## 2013-05-12 LAB — LDL CHOLESTEROL, DIRECT: Direct LDL: 139.1 mg/dL

## 2013-05-12 LAB — HEPATIC FUNCTION PANEL
ALT: 18 U/L (ref 0–35)
Albumin: 3.8 g/dL (ref 3.5–5.2)
Total Protein: 6.6 g/dL (ref 6.0–8.3)

## 2013-05-12 LAB — BASIC METABOLIC PANEL
GFR: 87.08 mL/min (ref >60.00)
Potassium: 3.6 mEq/L (ref 3.5–5.1)
Sodium: 139 mEq/L (ref 135–145)

## 2013-05-12 LAB — LIPID PANEL
HDL: 57.6 mg/dL (ref >39.00)
Triglycerides: 69 mg/dL (ref 0.0–149.0)

## 2013-05-12 MED ORDER — LISINOPRIL 10 MG PO TABS: 10.0000 mg | ORAL_TABLET | Freq: Every day | ORAL | Status: DC

## 2013-05-12 MED ORDER — CYCLOBENZAPRINE HCL 5 MG PO TABS: ORAL_TABLET | ORAL | Status: DC

## 2013-05-12 NOTE — Progress Notes (Signed)
  Subjective:    Patient ID: Samantha Stark, female    DOB: 06/27/1956, 57 y.o.   MRN: [id number]  HPI  She  is here for a physical;acute issues include BP elevation in context of job stresses.     Review of Systems  She is on a modified  heart healthy , gluten free , dairy free diet; she exercises as walking 4-5 mpd 5 times per week without symptoms. Specifically she denies chest pain, palpitations, dyspnea, or claudication. Family history is negative for premature coronary disease. Advanced cholesterol testing reveals her LDL goal is less than 115.Statin intolerant as leg cramps Home blood pressure range 139-150/89-110 recently. Patient is compliant with medications.No adverse effects noted from medication No significant lightheadedness, headache, epistaxis, or syncope       Objective:   Physical Exam Gen.: Healthy and well-nourished in appearance. Alert, appropriate and cooperative throughout exam. Head: Normocephalic without obvious abnormalities  Eyes: No corneal or conjunctival inflammation noted. No lid lag or proptosis. Extraocular motion intact. Vision grossly normal without lenses Ears: External  ear exam reveals no significant lesions or deformities. Canals clear .TMs normal. Hearing is grossly normal bilaterally. Nose: External nasal exam reveals no deformity or inflammation. Nasal mucosa are pink and moist. No lesions or exudates noted.   Mouth: Oral mucosa and oropharynx reveal no lesions or exudates. Teeth in good repair. Neck: No deformities, masses, or tenderness noted. Range of motion& Thyroid normal. Lungs: Normal respiratory effort; chest expands symmetrically. Lungs are clear to auscultation without rales, wheezes, or increased work of breathing. Heart: Normal rate and rhythm. Normal S1 and S2. No gallop, click, or rub. No murmur. Abdomen: Bowel sounds normal; abdomen soft and nontender. No masses, organomegaly or hernias noted. Genitalia: As per Gyn                                   Musculoskeletal/extremities: No deformity or scoliosis noted of  the thoracic or lumbar spine.  No clubbing, cyanosis, edema, or significant extremity  deformity noted. Range of motion normal .Tone & strength  Normal. Joints normal . Nail health good. Minor knee crepitus. Able to lie down & sit up w/o help. Negative SLR bilaterally Vascular: Carotid, radial artery, dorsalis pedis and  posterior tibial pulses are full and equal. No bruits present. Neurologic: Alert and oriented x3. Deep tendon reflexes symmetrical and normal.         Skin: Intact without suspicious lesions or rashes. Lymph: No cervical, axillary lymphadenopathy present. Psych: Mood and affect are normal. Normally interactive                                                                                        Assessment & Plan:  #1 comprehensive physical exam; no acute findings  Plan: see Orders  & Recommendations

## 2013-05-12 NOTE — Patient Instructions (Addendum)
To prevent palpitations or premature beats, avoid stimulants such as decongestants, diet pills, nicotine, or caffeine (coffee, tea, cola, or chocolate) to excess. Minimal Blood Pressure Goal= AVERAGE < 140/90;  Ideal is an AVERAGE < 135/85. This AVERAGE should be calculated from @ least 5-7 BP readings taken @ different times of day on different days of week. You should not respond to isolated BP readings , but rather the AVERAGE for that week .Please bring your  blood pressure cuff to office visits to verify that it is reliable.It  can also be checked against the blood pressure device at the pharmacy.

## 2013-05-16 LAB — VITAMIN D 1,25 DIHYDROXY
Vitamin D 1, 25 (OH)2 Total: 55 pg/mL (ref 18–72)
Vitamin D2 1, 25 (OH)2: <8 pg/mL

## 2013-05-16 NOTE — Telephone Encounter (Signed)
Patient notified of results.

## 2013-05-17 ENCOUNTER — Ambulatory Visit (HOSPITAL_COMMUNITY): Payer: Managed Care, Other (non HMO)

## 2013-05-17 ENCOUNTER — Other Ambulatory Visit: Payer: Self-pay | Admitting: Internal Medicine

## 2013-05-17 ENCOUNTER — Encounter: Payer: Self-pay | Admitting: Internal Medicine

## 2013-05-17 DIAGNOSIS — Z1231 Encounter for screening mammogram for malignant neoplasm of breast: Secondary | ICD-10-CM

## 2013-06-02 ENCOUNTER — Ambulatory Visit (HOSPITAL_COMMUNITY)
Admission: RE | Admit: 2013-06-02 | Discharge: 2013-06-02 | Disposition: A | Discharge status: Home / Self Care | Payer: Managed Care, Other (non HMO) | Source: Ambulatory Visit | Attending: Internal Medicine | Admitting: Internal Medicine

## 2013-06-02 DIAGNOSIS — Z1231 Encounter for screening mammogram for malignant neoplasm of breast: Secondary | ICD-10-CM | POA: Insufficient documentation

## 2013-06-02 DIAGNOSIS — Z13820 Encounter for screening for osteoporosis: Secondary | ICD-10-CM | POA: Insufficient documentation

## 2013-06-02 DIAGNOSIS — M8580 Other specified disorders of bone density and structure, unspecified site: Secondary | ICD-10-CM

## 2013-06-02 DIAGNOSIS — Z780 Asymptomatic menopausal state: Secondary | ICD-10-CM | POA: Insufficient documentation

## 2013-06-05 ENCOUNTER — Encounter: Payer: Self-pay | Admitting: Internal Medicine

## 2013-06-06 ENCOUNTER — Encounter: Payer: Self-pay | Admitting: Internal Medicine

## 2013-06-06 NOTE — Telephone Encounter (Signed)
Spoke with the pt and informed her that she need to be seen before she can be referred to another provider.  Pt agreed and was scheduled an appt.//AB/CMA

## 2013-06-06 NOTE — Telephone Encounter (Signed)
Lm @ (4:28pm) asking the pt to RTC regarding note in MyChart.//AB/CMA

## 2013-06-09 ENCOUNTER — Encounter: Payer: Self-pay | Admitting: Family Medicine

## 2013-06-09 ENCOUNTER — Ambulatory Visit (INDEPENDENT_AMBULATORY_CARE_PROVIDER_SITE_OTHER): Payer: Managed Care, Other (non HMO) | Admitting: Family Medicine

## 2013-06-09 ENCOUNTER — Other Ambulatory Visit: Payer: Self-pay

## 2013-06-09 ENCOUNTER — Telehealth: Payer: Self-pay | Admitting: Internal Medicine

## 2013-06-09 ENCOUNTER — Ambulatory Visit (HOSPITAL_COMMUNITY)
Admission: RE | Admit: 2013-06-09 | Discharge: 2013-06-09 | Disposition: A | Discharge status: Home / Self Care | Payer: Managed Care, Other (non HMO) | Source: Ambulatory Visit | Attending: Family Medicine | Admitting: Family Medicine

## 2013-06-09 VITALS — BP 130/60 | HR 80 | Temp 98.5°F | Wt 160.4 lb

## 2013-06-09 DIAGNOSIS — J90 Pleural effusion, not elsewhere classified: Secondary | ICD-10-CM

## 2013-06-09 DIAGNOSIS — IMO0002 Reserved for concepts with insufficient information to code with codable children: Secondary | ICD-10-CM | POA: Insufficient documentation

## 2013-06-09 DIAGNOSIS — E041 Nontoxic single thyroid nodule: Secondary | ICD-10-CM | POA: Insufficient documentation

## 2013-06-09 MED ORDER — HYDROCODONE-ACETAMINOPHEN 5-300 MG PO TABS: ORAL_TABLET | ORAL | Status: DC

## 2013-06-09 MED ORDER — IOHEXOL 300 MG/ML  SOLN
100.0000 mL | Freq: Once | INTRAMUSCULAR | Status: AC | PRN
  Administered 2013-06-09: 100 mL via INTRAVENOUS

## 2013-06-09 NOTE — Telephone Encounter (Signed)
Patient is calling in response to her CT results. She received a message from Dr. Laury Axon via MyChart and has questions. Please advise.

## 2013-06-09 NOTE — Telephone Encounter (Signed)
Discussed with patient. Dr.Lowne advises not to travel until she has seen Pulm. Work note sent via my-chart and we will work on referral on Monday.       KP

## 2013-06-09 NOTE — Patient Instructions (Addendum)
Pleural Effusion  The lining covering your lungs and the inside of your chest is called the pleura. Usually, the space between the 2 pleura contains no air and only a thin layer of fluid. A pleural effusion is an abnormal buildup of fluid in the pleural space.  Fluid gathers when there is increased pressure in the lung vessels. This forces fluids out of the lungs and into the pleural space. Vessels may also leak fluids when there are infections, such as pneumonia, or other causes of soreness and redness (inflammation). Fluids leak into the lungs when protein in the blood is low or when certain vessels (lymphatics) are blocked.  Finding a pleural effusion is important because it is usually caused by another disease. In order to treat a pleural effusion, your caregiver needs to find its cause. If left untreated, a large amount of fluid can build up and cause collapse of the lung.  CAUSES    Heart failure.   Infections (pneumonia, tuberculosis), pulmonary embolism, pulmonary infarction.   Cancer (primary lung and metastatic), asbestosis.   Liver failure (cirrhosis).   Nephrotic syndrome, peritoneal dialysis, kidney problems (uremia).   Collagen vascular disease (systemic lupus erythematosis, rheumatoid arthritis).   Injury (trauma) to the chest or rupture of the digestive tube (esophagus).   Material in the chest or pleural space (hemothorax, chylothorax).   Pancreatitis.   Surgery.   Drug reactions.  SYMPTOMS   A pleural effusion can decrease the amount of space available for breathing and make you short of breath. The fluid can become infected, which may cause pain and fever. Often, the pain is worse when taking a deep breath. The underlying disease (heart failure, pneumonia, blood clot, tuberculosis, cancer) may also cause symptoms.  DIAGNOSIS    Your caregiver can usually tell what is wrong by talking to you (taking a history), doing an exam, and taking a routine X-ray. If the X-ray shows fluid in your  chest, often fluid is removed from your chest with a needle for testing (diagnostic thoracentesis).   Sometimes, more specialized X-rays may be needed.   Sometimes, a small piece of tissue is removed and examined by a specialist (biopsy).  TREATMENT   Treatment varies based on what caused the pleural effusion. Treatments include:   Removing as much fluid as possible using a needle (thoracentesis) to improve the cough and shortness of breath. This is a simple procedure which can be done at bedside. The risks are bleeding, infection, collapse of a lung, or low blood pressure.   Placing a tube in the chest to drain the effusion (tube thoracostomy). This is often used when there is an infection in the fluid. This is a simple procedure which can often be done at bedside or in a clinic. The procedure may be painful. The risks are the same as using a needle to drain the fluid. The chest tube usually remains for a few days and is connected to suction to improve fluid drainage. The tube, after placement, usually does not cause much discomfort.   Surgical removal of fibrous debris in and around the pleural space (decortication). This may be done with a flexible telescope (thoracoscope) through a small or large cut (incision). This is helpful for patients who have fibrosis or scar tissue that prevents complete lung expansion. The risks are infection, blood loss, and side effects from general anesthesia.   Sometimes, a procedure called pleurodesis is done. A chest tube is placed and the fluid is drained. Next, an agent (  tetracycline, talc powder) is added to the pleural space. This causes the lung and chest wall to stick together (adhesion). This leaves no potential space for fluid to build up. The risks include infection, blood loss, and side effects from general anesthesia.   If the effusion is caused by infection, it may be treated with antibiotics and improve without draining.  HOME CARE INSTRUCTIONS    Take any  medicines exactly as prescribed.   Follow up with your caregiver as directed.   Monitor your exercise capacity (the amount of walking you can do before you get short of breath).   Do not smoke. Ask your caregiver for help quitting.  SEEK MEDICAL CARE IF:    Your exercise capacity seems to get worse or does not improve with time.   You do not recover from your illness.  SEEK IMMEDIATE MEDICAL CARE IF:    Shortness of breath or chest pain develops or gets worse.   You have an oral temperature above 102 F (38.9 C), not controlled by medicine.   You develop a new cough, especially if the mucus (phlegm) is discolored.  MAKE SURE YOU:    Understand these instructions.   Will watch your condition.   Will get help right away if you are not doing well or get worse.  Document Released: 09/28/2005 Document Revised: 12/21/2011 Document Reviewed: 05/20/2007  ExitCare Patient Information 2014 [employer].

## 2013-06-09 NOTE — Telephone Encounter (Signed)
There is a small effusion---I will refer you to pulmonary for evaluation. When will you be back from [place]?---I know you said you were leaving Monday.  I might be able to get you in Tuesday if you are at all able to postpone leaving.   Also --there is a nodule on the thyroid. I see you have seen Dr Talmage Nap for this in the past but the [place] was not repeated. Do you still see her?

## 2013-06-10 ENCOUNTER — Encounter: Payer: Self-pay | Admitting: Family Medicine

## 2013-06-10 DIAGNOSIS — J90 Pleural effusion, not elsewhere classified: Secondary | ICD-10-CM | POA: Insufficient documentation

## 2013-06-10 NOTE — Assessment & Plan Note (Signed)
Chest CT Probable referral to pulm Pt is supposed to leave for [place] on Monday--- she will consider postponing trip

## 2013-06-10 NOTE — Progress Notes (Signed)
  Subjective:    Patient ID: Samantha Stark, female    DOB: [birth year], 57 y.o.   MRN: [id number]  HPI Pt here f/u ER in [place].  She was having back pain and thought she had a kidney stone but had a pleural effusion.  CT abd and xrays done in [place].   They wanted to do thorocentesis but she wanted to come home to have it checked.  Pt still has back pain.     Review of Systems As above     Objective:   Physical Exam  BP 130/60  Pulse 80  Temp(Src) 98.5 F (36.9 C) (Oral)  Wt 160 lb 6.4 oz (72.757 kg)  BMI 29.33 kg/m2  SpO2 97% General appearance: alert, cooperative, appears stated age and no distress Lungs: clear to auscultation bilaterally Heart: S1, S2 normal Extremities: extremities normal, atraumatic, no cyanosis or edema      Assessment & Plan:

## 2013-06-13 ENCOUNTER — Telehealth: Payer: Self-pay | Admitting: Internal Medicine

## 2013-06-13 NOTE — Telephone Encounter (Signed)
I spoke with the Samantha Stark and she states that she has to fly to [place] for work and needs an appt sooner then Friday so she does not have to miss work. She is insisting on today. There is nothing available so I spoke with  Dr. Sherene Sires and he reviewed the Samantha Stark ct and said there was nothing on there that suggested she needed to be seen sooner or that she cannot fly but that she should check with her pcp. He stated I could add her on tomorrow AM.  I advised the Samantha Stark of this and appt set for 9am tomorrow. Carron Curie, CMA

## 2013-06-14 ENCOUNTER — Encounter: Payer: Self-pay | Admitting: Internal Medicine

## 2013-06-14 ENCOUNTER — Ambulatory Visit (INDEPENDENT_AMBULATORY_CARE_PROVIDER_SITE_OTHER): Payer: Managed Care, Other (non HMO) | Admitting: Internal Medicine

## 2013-06-14 ENCOUNTER — Other Ambulatory Visit (INDEPENDENT_AMBULATORY_CARE_PROVIDER_SITE_OTHER): Payer: Managed Care, Other (non HMO)

## 2013-06-14 VITALS — BP 118/80 | HR 73 | Temp 97.2°F | Ht 62.0 in | Wt 158.0 lb

## 2013-06-14 DIAGNOSIS — J90 Pleural effusion, not elsewhere classified: Secondary | ICD-10-CM

## 2013-06-14 NOTE — Patient Instructions (Addendum)
Please remember to go to the lab  department downstairs for your tests - we will call you with the results when they are available.  Would recommend you take one coated aspirin daily since you travel so often, prefer the 325 mg with breakfast   Late add: called in D dimer, rec venous doppler.

## 2013-06-14 NOTE — Progress Notes (Signed)
  Subjective:    Patient ID: Samantha Stark, female    DOB: 1956-01-26  MRN: [id number]  HPI  89 yowf never smoker with acute R post CP  06/04/13 While on a jet to [place] > to ER there where eval for kidney stone showed a small R Effusion and treated with pain meds (tramadol caused itching, hydrocodone did not)  and sent back to [place].  06/14/2013 1st [facility] office visit/ Samantha Stark cc acute pain as above occurred s provocation (except very active with [place] do) and resolved immediately p chiropractor worked on her 06/10/13 and no further pain meds at all.  Pain was continuous, made worse by deep breath but not immediately lying down, and somewhat positional, no assoc arthritis.  No swelling in legs.   No obvious daytime variabilty or assoc chronic cough or cp or chest tightness, subjective wheeze overt sinus or hb symptoms. No unusual exp hx or h/o childhood pna/ asthma or knowledge of premature birth.   Sleeping ok without nocturnal  or early am exacerbation  of respiratory  c/o's or need for noct saba. Also denies any obvious fluctuation of symptoms with weather or environmental changes or other aggravating or alleviating factors except as outlined above   Current Medications, Allergies, Past Medical History, Past Surgical History, Family History, and Social History were reviewed in [employer] record.   .        Review of Systems  Constitutional: Negative for fever, chills and unexpected weight change.  HENT: Negative for ear pain, nosebleeds, congestion, sore throat, rhinorrhea, sneezing, trouble swallowing, dental problem, voice change, postnasal drip and sinus pressure.   Eyes: Negative for visual disturbance.  Respiratory: Negative for cough, choking and shortness of breath.   Cardiovascular: Negative for chest pain and leg swelling.  Gastrointestinal: Negative for vomiting, abdominal pain and diarrhea.  Genitourinary: Negative for difficulty  urinating.  Musculoskeletal: Negative for arthralgias.  Skin: Negative for rash.  Neurological: Negative for tremors, syncope and headaches.  Hematological: Does not bruise/bleed easily.       Objective:   Physical Exam  Wt Readings from Last 3 Encounters:  06/14/13 158 lb (71.668 kg)  06/09/13 160 lb 6.4 oz (72.757 kg)  05/12/13 156 lb (70.761 kg)     HEENT: nl dentition, turbinates, and orophanx. Nl external ear canals without cough reflex   NECK :  without JVD/Nodes/TM/ nl carotid upstrokes bilaterally   LUNGS: no acc muscle use, clear to A and P bilaterally without cough on insp or exp maneuvers   CV:  RRR  no s3 or murmur or increase in P2, no edema   ABD:  soft and nontender with nl excursion in the supine position. No bruits or organomegaly, bowel sounds nl  MS:  warm without deformities, calf tenderness, cyanosis or clubbing  SKIN: warm and dry without lesions    NEURO:  alert, approp, no deficits     Ct chest with contrast (not pe protocol)  06/09/13 Small right pleural effusion     Assessment & Plan:

## 2013-06-15 ENCOUNTER — Encounter: Payer: Self-pay | Admitting: Internal Medicine

## 2013-06-15 NOTE — Progress Notes (Signed)
Quick Note:  Spoke with pt and notified of results per Dr. Wert. Pt verbalized understanding and denied any questions.  ______ 

## 2013-06-16 ENCOUNTER — Institutional Professional Consult (permissible substitution): Payer: Managed Care, Other (non HMO) | Admitting: Internal Medicine

## 2013-06-18 ENCOUNTER — Encounter: Payer: Self-pay | Admitting: Internal Medicine

## 2013-06-18 NOTE — Assessment & Plan Note (Signed)
-   CT chest 06/09/13 non -pe protocol Small right pleural effusion - 06/15/13 di dimer 0.8 > rec bilateral venous dopplers asap  The assoc cp that called for the ct was positional as much as pleuritic and immediately resolved p chiropractic manipulation suggesting mscp but not explaining effusion. The ct was not done with pe protocol and could have missed a small peripheral PE > rec ASA 325 mg daily and venous dopplers asap, low threshold to repeat CTangiogram of chest  See instructions for specific recommendations which were reviewed directly with the patient who was given a copy with highlighter outlining the key components.

## 2013-06-19 ENCOUNTER — Other Ambulatory Visit: Payer: Self-pay | Admitting: Internal Medicine

## 2013-06-20 MED ORDER — ESCITALOPRAM OXALATE 10 MG PO TABS: ORAL_TABLET | ORAL | Status: DC

## 2013-06-22 ENCOUNTER — Encounter: Payer: Self-pay | Admitting: *Deleted

## 2013-06-22 ENCOUNTER — Telehealth: Payer: Self-pay | Admitting: *Deleted

## 2013-06-22 NOTE — Telephone Encounter (Signed)
Message copied by Verdie Shire on Thu Jun 22, 2013  2:35 PM ------      Message from: Pecola Lawless      Created: Sun Jun 18, 2013 10:01 AM       Findings 06/02/13 : lowest T score - 1.1 @  hip      Diagnosis: minimal Osteopenia      Recommended lifestyle interventions to prevent Osteoporosis include calcium 600 mg 1-2 times a day  & vitamin D3 supplementation to keep vit D  level @ least 40-60. The usual vitamin D3 dose is 1000 IU daily; but individual dose is determined by annual vitamin D level monitor. Also weight bearing exercise such as  walking 30-45 minutes 3-4  X per week is recommended.       Repeat BMD every 25 months.              ------

## 2013-06-22 NOTE — Telephone Encounter (Signed)
Message copied by Verdie Shire on Thu Jun 22, 2013  2:34 PM ------      Message from: Pecola Lawless      Created: Sun Jun 18, 2013 10:01 AM       Findings 06/02/13 : lowest T score - 1.1 @  hip      Diagnosis: minimal Osteopenia      Recommended lifestyle interventions to prevent Osteoporosis include calcium 600 mg 1-2 times a day  & vitamin D3 supplementation to keep vit D  level @ least 40-60. The usual vitamin D3 dose is 1000 IU daily; but individual dose is determined by annual vitamin D level monitor. Also weight bearing exercise such as  walking 30-45 minutes 3-4  X per week is recommended.       Repeat BMD every 25 months.              ------

## 2013-06-23 ENCOUNTER — Ambulatory Visit: Payer: Self-pay | Admitting: Cardiovascular Disease

## 2013-06-23 ENCOUNTER — Encounter: Payer: Self-pay | Admitting: *Deleted

## 2013-06-23 NOTE — Telephone Encounter (Signed)
Letter mailed.//AB/CMA 

## 2013-06-28 ENCOUNTER — Other Ambulatory Visit: Payer: Self-pay | Admitting: *Deleted

## 2013-06-28 NOTE — Telephone Encounter (Signed)
Fax From: [facility] for Lexapro 10 mg Last Refilled: 06/20/13 #90/1 rfs by Dr. Oren Bracket Aex Scheduled: no aex scheduled last AEX 02/23/12  06/15/13 LM for patient to call us back  06/28/13 Patient has not called [place], according to Epic patient's last rx was given by Dr. Marga Melnick, MD  Denying rx patient needs to contact our office for AEX & further refills.

## 2013-07-05 ENCOUNTER — Ambulatory Visit (HOSPITAL_COMMUNITY)
Admission: RE | Admit: 2013-07-05 | Discharge: 2013-07-05 | Disposition: A | Discharge status: Home / Self Care | Payer: Managed Care, Other (non HMO) | Source: Ambulatory Visit | Attending: Internal Medicine | Admitting: Internal Medicine

## 2013-07-05 ENCOUNTER — Encounter: Payer: Self-pay | Admitting: Internal Medicine

## 2013-07-05 ENCOUNTER — Ambulatory Visit (INDEPENDENT_AMBULATORY_CARE_PROVIDER_SITE_OTHER): Payer: Managed Care, Other (non HMO) | Admitting: Internal Medicine

## 2013-07-05 VITALS — BP 120/80 | HR 75 | Temp 98.3°F | Wt 159.8 lb

## 2013-07-05 DIAGNOSIS — E041 Nontoxic single thyroid nodule: Secondary | ICD-10-CM

## 2013-07-05 DIAGNOSIS — E042 Nontoxic multinodular goiter: Secondary | ICD-10-CM | POA: Insufficient documentation

## 2013-07-05 DIAGNOSIS — J90 Pleural effusion, not elsewhere classified: Secondary | ICD-10-CM | POA: Insufficient documentation

## 2013-07-05 DIAGNOSIS — Z23 Encounter for immunization: Secondary | ICD-10-CM

## 2013-07-05 LAB — SEDIMENTATION RATE: Sed Rate: 16 mm/hr (ref 0–22)

## 2013-07-05 NOTE — Assessment & Plan Note (Addendum)
Prophylactic aspirin and support hose  CT angiogram emergently if symptoms recur  Repeat sedimentation rate and chest x-ray  Modified incentive spirometry with balloons

## 2013-07-05 NOTE — Assessment & Plan Note (Signed)
Refer to Dr Samantha Stark

## 2013-07-05 NOTE — Patient Instructions (Addendum)
Please  blowup at least 10  balloons a day to enhance inflation of the lungs and prevent atelectasis as we discussed. Order for x-rays entered into  the computer; these will be performed at [facility]. No appointment is necessary.

## 2013-07-05 NOTE — Progress Notes (Signed)
  Subjective:    Patient ID: Samantha Stark, female    DOB: [birth year], 57 y.o.   MRN: [id number]  HPI  Her complicated history was reviewed. She developed pleuritic pain which was associated with inspiration as well as position change. This did resolve with chiropractory manipulation. She was found to have a right pleural effusion.  These findings are in the context of repeated transcontinental flights to [place], [place].  The d-dimer was mildly elevated but CT with contrast (this was not CT angiogram) was negative. Her sedimentation rate was mildly elevated at 33.  Review of Systems  At this time she is physically active without symptoms. She specifically denies fever, chills, sweats, or weight loss. She denies any pleuritic chest pain, shortness of breath, hemoptysis, or wheezing.     Objective:   Physical Exam  General appearance is one of good health and nourishment w/o distress.  Eyes: No conjunctival inflammation or scleral icterus is present.  Thyroid is physiologically asymmetric and firm. I cannot appreciate definite nodules  Oral exam: Dental hygiene is good; lips and gums are healthy appearing.There is no oropharyngeal erythema or exudate noted.   Heart:  Normal rate and regular rhythm. S1 and S2 normal without gallop, murmur, click, rub or other extra sounds     Lungs:Chest clear to auscultation; no wheezes, rhonchi,rales ,or rubs present.No increased work of breathing. No splinting present  Abdomen: bowel sounds normal, soft and non-tender without masses, organomegaly or hernias noted.  No guarding or rebound   Skin:Warm & dry.  Intact without suspicious lesions or rashes ; no jaundice or tenting  Lymphatic: No lymphadenopathy is noted about the head, neck, axilla  Homans sign negative bilaterally            Assessment & Plan:  See Current Assessment & Plan in Problem List under specific Diagnosis

## 2013-07-11 ENCOUNTER — Encounter: Payer: Self-pay | Admitting: Cardiovascular Disease

## 2013-07-21 ENCOUNTER — Encounter: Payer: Self-pay | Admitting: Internal Medicine

## 2013-07-28 ENCOUNTER — Ambulatory Visit: Payer: Managed Care, Other (non HMO) | Admitting: Internal Medicine

## 2013-08-04 ENCOUNTER — Encounter: Payer: Self-pay | Admitting: Internal Medicine

## 2013-08-04 ENCOUNTER — Ambulatory Visit (INDEPENDENT_AMBULATORY_CARE_PROVIDER_SITE_OTHER): Payer: Managed Care, Other (non HMO) | Admitting: Internal Medicine

## 2013-08-04 ENCOUNTER — Ambulatory Visit: Payer: Managed Care, Other (non HMO) | Admitting: Internal Medicine

## 2013-08-04 VITALS — BP 124/88 | HR 77 | Temp 98.0°F | Resp 10 | Ht 62.5 in | Wt 159.0 lb

## 2013-08-04 DIAGNOSIS — E042 Nontoxic multinodular goiter: Secondary | ICD-10-CM

## 2013-08-04 NOTE — Progress Notes (Addendum)
Patient ID: Samantha Stark, female   DOB: [birth year], 57 y.o.   MRN: [id number]   HPI  Samantha Stark is a 57 y.o.-year-old female, referred by her PCP, Dr. Alwyn Ren, for evaluation for MNG.  She was found to have thyroid nodules on palpation in 2013 (Dr Talmage Nap) >>Thyroid U/S (02/2012): multinodular goiter, with the largest nodule being in the lower right lobe, measuring 1.9 cm. She was scheduled to come back >> did not return.  She was found to have hypothyroidism in 2013 >> started Levothyroxine for few months >> then stop >> tests normalized.  Pt denies feeling nodules in neck, hoarseness, dysphagia/odynophagia, SOB with lying down.  I reviewed pt's thyroid tests: Lab Results  Component Value Date   TSH 2.27 05/12/2013   TSH 2.20 05/06/2010   TSH 2.41 07/18/2008   TSH 1.71 01/16/2008   TSH 2.143 05/05/2007   TSH 1.493 03/15/2007   FREET4 1.0 01/16/2008   FREET4 1.49 05/05/2007    Pt c/o: - + cold intolerance - no tremors - + anxiety/no depression - no palpitations - + fatigue - no hyperdefecation/constipation - no weight loss/weight gain, but noticed truncal distribution of fat  Pt does have FH of thyroid ds in mother (hypothyr0. No FH of thyroid cancer. No h/o radiation tx to head or neck.  Pt also c/o having hypocalcemia >> reviewed records >> no hypercalcemia since 2008: Lab Results  Component Value Date   CALCIUM 10.3 05/12/2013   CALCIUM 10.2 05/06/2010   CALCIUM 9.9 07/18/2008   CALCIUM 9.8 01/16/2008   CALCIUM 10.4 05/05/2007   CALCIUM 10.4 03/24/2007   CALCIUM 11.0* 03/15/2007   CALCIUM 10.6* 06/10/2006  Vit D normal at last check. She is on supplementation.   Pt also tells me she has osteoporosis: DEXA reviewed from 2014:  min T score -1.1 in FN >> osteopenia.  She c/o abdominal fat distribution, wonders if cortisol is high. 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 She recently eliminated gluten and dairy from diet >> lost 14  lbs.  ROS: Constitutional: no weight gain/loss, + fatigue, + subjective hypothermia, + poor sleep Eyes: no blurry vision, no xerophthalmia ENT: no sore throat, no nodules palpated in throat, no dysphagia/odynophagia, no hoarseness Cardiovascular: no CP/SOB/palpitations/+ leg swelling Respiratory: no cough/SOB Gastrointestinal: no N/V/D/C Musculoskeletal: no muscle/+ occas. joint aches Skin: no rashes, + easy bruising Neurological: no tremors/numbness/tingling/dizziness Psychiatric: no depression/+ anxiety Low libido  Past Medical History  Diagnosis Date  . Hyperlipidemia   . Hypertension   . Anxiety     Situational  . Diverticulosis   . Chest pain 2006 & 2008    Negative nuclear stress study, Dr. Eden Emms  . MVP (mitral valve prolapse)    Past Surgical History  Procedure Laterality Date  . Colonoscopy  2008 & 2011    [facility], negative except for Tics  . Spine surgery      Lumbar laminectomy L5-S1  . Tonsillectomy    . Knee arthroscopy      Bilaterally  . Rotator cuff repair  2010    Right shoulder; Dr. Thomasena Edis   History   Social History  . Marital Status: Married    Spouse Name: N/A    Number of Children: 2   Occupational History  . Engineer, structural    Social History Main Topics  . Smoking status: Never Smoker   . Smokeless tobacco: Not on file  . Alcohol Use: 1.8 oz/week  3 Glasses of wine per week     Comment: wine 3 times per wk  . Drug Use: No  . Sexual Activity: Yes    Partners: Male   Social History Narrative   Regular exercise: yes   Caffeine use: daily   Current Outpatient Prescriptions on File Prior to Visit  Medication Sig Dispense Refill  . cyclobenzaprine (FLEXERIL) 5 MG tablet 1-2 qhs prn  14 tablet  0  . escitalopram (LEXAPRO) 10 MG tablet TAKE 1 TABLET BY MOUTH ONCE DAILY  90 tablet  1  . Hydrocodone-Acetaminophen 5-300 MG TABS 1 po q 6h prn  30 each  0  . lisinopril (PRINIVIL,ZESTRIL) 10 MG tablet Take 1 tablet (10 mg  total) by mouth daily.  90 tablet  3  . propranolol (INDERAL) 10 MG tablet Take one tablet by mouth as needed for palpitations up to 3 per day and at least 6 hours apart.  90 tablet  1  . spironolactone (ALDACTONE) 25 MG tablet TAKE ONE TABLET BY MOUTH DAILY AS NEEDED  90 tablet  0   No current facility-administered medications on file prior to visit.   Allergies  Allergen Reactions  . Shrimp [Shellfish Allergy]     Angioedema caused by shrimp as well as oysters. EpiPen current  . Amlodipine Besylate     REACTION: edema, fatigue  . Calcium Channel Blockers     REACTION: edema , fatigue  . Cephalexin     REACTION: diarrhea  . Codeine     REACTION: nausea  . Hydrochlorothiazide W-Triamterene     REACTION: low potassium  . Statins     Leg cramps with Lipitor & Pravachol   Family History  Problem Relation Age of Onset  . Osteoporosis Maternal Grandmother   . Colon polyps Mother   . Hyperlipidemia Mother   . Goiter Mother     also Alene Mires  . Colon polyps Brother   . Hyperlipidemia Brother     X2  . Colon polyps Maternal Aunt   . Diabetes Maternal Aunt     X2  . Alcohol abuse Father   . Hyperlipidemia Sister   . Heart attack Maternal Uncle     MI in 21s  . Stroke Maternal Grandfather     in 21s  . Asthma Mother     PE: BP 124/88  Pulse 77  Temp(Src) 98 F (36.7 C) (Oral)  Resp 10  Ht 5' 2.5" (1.588 m)  Wt 159 lb (72.122 kg)  BMI 28.6 kg/m2  SpO2 97% Wt Readings from Last 3 Encounters:  08/04/13 159 lb (72.122 kg)  07/05/13 159 lb 12.8 oz (72.485 kg)  06/14/13 158 lb (71.668 kg)   Constitutional: overweight, in NAD Eyes: PERRLA, EOMI, no exophthalmos ENT: moist mucous membranes, no  thyromegaly, no cervical lymphadenopathy Cardiovascular: RRR, No MRG Respiratory: CTA B Gastrointestinal: abdomen soft, NT, ND, BS+ Musculoskeletal: no deformities, strength intact in all 4;  Skin: moist, warm, no rashes Neurological: no tremor with outstretched hands,  DTR normal in all 4  ASSESSMENT: 1. MNG - thyroid U/S (01/26/2012):  Heterogeneous echotexture and slight hypervascularity of the gland  Right lobe 4.8 x 1.6 x 1.8 cm  Left lobe 4.9 x 1.5 x 1.8 cm  Isthmus mildly thick, 5 mm in width  Multiple mixed solid and/or cystic nodules within both lobes. The largest is at the lower pole of the right lobe measuring 1.9 cm.  No lymphadenopathy  PLAN: 1. MNG  - I reviewed the  images of her thyroid ultrasound along with the patient. I pointed out that the dominant nodule is fairly large and hypervascular, these being risk factors for cancer. Otherwise, the nodule is mostly isoechoic, without calcifications, and well delimited from surrounding tissue. The rest of the thyroid nodules are not well demarcated. Pt does not have a thyroid cancer family history or a personal history of RxTx to head/neck. All these would favor benignity. I believe her risk of cancer is less than 10-15%.  - we did discuss about the fact that the ultrasound is now 1.58 yr old, and it was done during a period when she was told that she developed hypothyroidism and she was started on levothyroxine. At that time, I suspect that she either had the debut of Hashimoto's thyroiditis or she was in the resolving phase after subacute thyroiditis (sinec she does not requite thyroid hh now, I am biased to believe the latter). Therefore, I believe that the best course of action is to repeat the ultrasound now that the thyroid activity has quieted down. If, on the new thyroid ultrasound, the nodules are well-differentiated and either the same size or enlarged, I would probably suggest an FNA. I explained what the test entails.Patient decided to have the FNA done now >> I ordered this.  - I explained that this is not cancer, we can continue to follow her on a yearly basis, and check another ultrasound in another year or 2. - she should let me know if she develops neck compression symptoms, in that  case, we might need to do either lobectomy or thyroidectomy - I'll see her back in a year, assuming her FNA is normal. If FNA abnormal, we will meet sooner.   Regarding: hypocalcemia, osteopenia, predominantly abdominal weight distribution - I reassured the patient that:  She does not have hypocalcemia anymore, I am not sure what caused it in the past but this is resolved for the last 6 years  Advised her to supplement calcium daily (make sure she gets 1000 mg daily) especially since she eliminated dairy completely from her diet  Reassured her that he does not have osteoporosis, but in minimal osteopenia. Reviewed DEXA images and report with her.  There is no indication of excess cortisol production, as she does not have any Cushing syndrome features, he is able to remain active and  Was able to lose 14 pounds recently. I did advise her that the redistribution of the fat deposits can appear after menopause.  08/18/2013 No FNA yet. I will addend the results when they become available.  09/15/2013 CLINICAL DATA: Bilateral thyroid nodules.  EXAM: THYROID ULTRASOUND  TECHNIQUE: Ultrasound examination of the thyroid gland and adjacent soft tissues was performed.  COMPARISON: January 26, 2012.  FINDINGS: Right thyroid lobe: 4.7 x 2.0 x 1.9 cm. Dominant nodule is noted which is predominantly solid and measures 1.7 x 1.6 x 1.3 cm in the lower pole. This is stable compared to prior exam. Several other smaller nodules are noted in the upper and middle pole, all less than 1 cm in size.  Left thyroid lobe: 5.0 x 1.8 x 1.4 cm. At least 4 nodules are noted in the upper and lower poles, all less than 1 cm in size.  Isthmus: 5 mm. No nodules visualized.  Lymphadenopathy: None.  IMPRESSION: 1.7 cm dominant solid nodule is noted in inferior pole of right thyroid lobe which is stable compared to prior exam. Multiple other smaller nodules are noted in both thyroid lobes.  Electronically  Signed By: Roque Lias M.D. On: 09/15/2013 08:35  I will suggest FNA >> will d/w pt.  10/23/2013 - FNA: Adequacy Reason Satisfactory For Evaluation. Diagnosis THYROID, FINE NEEDLE ASPIRATION, RIGHT (SPECIMEN 1 OF 1, COLLECTED ON 10/20/2013): BENIGN. FINDINGS CONSISTENT WITH A BENIGN FOLLICULAR NODULE. Pecola Leisure MD Pathologist, Electronic Signature (Case signed 10/23/2013) Specimen Clinical Information No ca hx Source Thyroid, Fine Needle Aspiration, Right, (Specimen 1 of 1, collected on 10/20/2013)  Will let pt know.

## 2013-08-04 NOTE — Patient Instructions (Signed)
You will be called to schedule the appointment for the thyroid Ultrasound. If the nodule is still there and still large, we might need to do a biopsy. This would be done in the same place. Please return in 1 year.

## 2013-08-07 ENCOUNTER — Other Ambulatory Visit: Payer: Managed Care, Other (non HMO)

## 2013-08-24 ENCOUNTER — Other Ambulatory Visit: Payer: Self-pay

## 2013-08-24 MED ORDER — PROPRANOLOL HCL 10 MG PO TABS: ORAL_TABLET | ORAL | Status: DC

## 2013-09-06 ENCOUNTER — Ambulatory Visit (INDEPENDENT_AMBULATORY_CARE_PROVIDER_SITE_OTHER): Payer: Managed Care, Other (non HMO) | Admitting: Cardiovascular Disease

## 2013-09-06 ENCOUNTER — Encounter: Payer: Self-pay | Admitting: Cardiovascular Disease

## 2013-09-06 VITALS — BP 130/70 | HR 74 | Ht 62.5 in | Wt 160.0 lb

## 2013-09-06 DIAGNOSIS — R002 Palpitations: Secondary | ICD-10-CM

## 2013-09-06 DIAGNOSIS — I10 Essential (primary) hypertension: Secondary | ICD-10-CM

## 2013-09-06 DIAGNOSIS — E782 Mixed hyperlipidemia: Secondary | ICD-10-CM

## 2013-09-06 MED ORDER — METOPROLOL SUCCINATE ER 25 MG PO TB24: 25.0000 mg | ORAL_TABLET | ORAL | Status: DC | PRN

## 2013-09-06 NOTE — Assessment & Plan Note (Signed)
Well controlled.  Continue current medications and low sodium Dash type diet.    

## 2013-09-06 NOTE — Assessment & Plan Note (Signed)
Cholesterol is at goal.  Continue current dose of statin and diet Rx.  No myalgias or side effects.  F/U  LFT's in 6 months. Lab Results  Component Value Date   LDLCALC 98 05/03/2009

## 2013-09-06 NOTE — Progress Notes (Signed)
Patient ID: Samantha Stark, female   DOB: 1956-01-13, 57 y.o.   MRN: [id number] 57 yo with history of benign palpitations. 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 Seen in 2013 after travel to   [place] had 2 nights of rapid pounding and palpitations. Does [employer] with no issues. No syncope chest pain or dyspnea. Notes lack of sleep and caffeine make palpitations more likely . No history of thyroid disease. Has not had bad episode since returning from travel. Palpitaitons are rapid skips and flip flops  F/U event monitor benign Had some palpitations a few weeks ago when tired and traveling a lot No resolved.  May need f/u thyroid biopsy.  Took inderal for a few days in a row.  No presyncope Compliant with BP meds  ROS: Denies fever, malais, weight loss, blurry vision, decreased visual acuity, cough, sputum, SOB, hemoptysis, pleuritic pain, palpitaitons, heartburn, abdominal pain, melena, lower extremity edema, claudication, or rash.  All other systems reviewed and negative  General: Affect appropriate Healthy:  appears stated age HEENT: normal Neck supple with no adenopathy JVP normal no bruits no thyromegaly Lungs clear with no wheezing and good diaphragmatic motion Heart:  S1/S2 no murmur, no rub, gallop or click PMI normal Abdomen: benighn, BS positve, no tenderness, no AAA no bruit.  No HSM or HJR Distal pulses intact with no bruits No edema Neuro non-focal Skin warm and dry No muscular weakness   Current Outpatient Prescriptions  Medication Sig Dispense Refill  . cyclobenzaprine (FLEXERIL) 5 MG tablet 1-2 qhs prn  14 tablet  0  . escitalopram (LEXAPRO) 10 MG tablet TAKE 1 TABLET BY MOUTH ONCE DAILY  90 tablet  1  . lisinopril (PRINIVIL,ZESTRIL) 10 MG tablet Take 1 tablet (10 mg total) by mouth daily.  90 tablet  3  . propranolol (INDERAL) 10 MG tablet Take one tablet by mouth as needed for palpitations up to 3  per day and at least 6 hours apart.  90 tablet  1  . spironolactone (ALDACTONE) 25 MG tablet TAKE ONE TABLET BY MOUTH DAILY AS NEEDED  90 tablet  0   No current facility-administered medications for this visit.    Allergies  Shrimp; Amlodipine besylate; Calcium channel blockers; Cephalexin; Codeine; Hydrochlorothiazide w-triamterene; and Statins  Electrocardiogram:  05/12/13  SR rate 61 normal   Assessment and Plan

## 2013-09-06 NOTE — Assessment & Plan Note (Signed)
Benign LA toprol called in in case she has prolonged episode Symptoms improved f/u thyroid and basic labs with primary No monitor for now

## 2013-09-06 NOTE — Patient Instructions (Signed)
Please take Metoprolol Succinate 25 mg as needed. Continue all other medications as listed.  Follow up in 1 year with Dr Eden Emms.  You will receive a letter in the mail 2 months before you are due.  Please call us when you receive this letter to schedule your follow up appointment.

## 2013-09-08 ENCOUNTER — Other Ambulatory Visit: Payer: Self-pay | Admitting: Internal Medicine

## 2013-09-08 NOTE — Telephone Encounter (Signed)
Spironolactone refilled per protocol 

## 2013-09-15 ENCOUNTER — Ambulatory Visit
Admission: RE | Admit: 2013-09-15 | Discharge: 2013-09-15 | Disposition: A | Discharge status: Home / Self Care | Payer: Managed Care, Other (non HMO) | Source: Ambulatory Visit | Attending: Internal Medicine | Admitting: Internal Medicine

## 2013-10-10 ENCOUNTER — Other Ambulatory Visit: Payer: Self-pay | Admitting: Internal Medicine

## 2013-10-10 DIAGNOSIS — E042 Nontoxic multinodular goiter: Secondary | ICD-10-CM

## 2013-10-13 ENCOUNTER — Ambulatory Visit (HOSPITAL_COMMUNITY): Payer: Managed Care, Other (non HMO)

## 2013-10-20 ENCOUNTER — Ambulatory Visit (HOSPITAL_COMMUNITY)
Admission: RE | Admit: 2013-10-20 | Discharge: 2013-10-20 | Disposition: A | Discharge status: Home / Self Care | Payer: Managed Care, Other (non HMO) | Source: Ambulatory Visit | Attending: Internal Medicine | Admitting: Internal Medicine

## 2013-10-20 DIAGNOSIS — E041 Nontoxic single thyroid nodule: Secondary | ICD-10-CM | POA: Insufficient documentation

## 2013-10-20 NOTE — Procedures (Signed)
US guided biopsy of Rt thyroid nodule  25g x 4  Pt tolerated well

## 2013-12-25 IMAGING — CR DG CHEST 2V
2 series · 2 of 2 positions shown · non-contrast
Comparison: CT 06/09/2013

CLINICAL DATA: Right pleural effusion

CHEST - 2 VIEW

[view not recorded (1 of 2)]
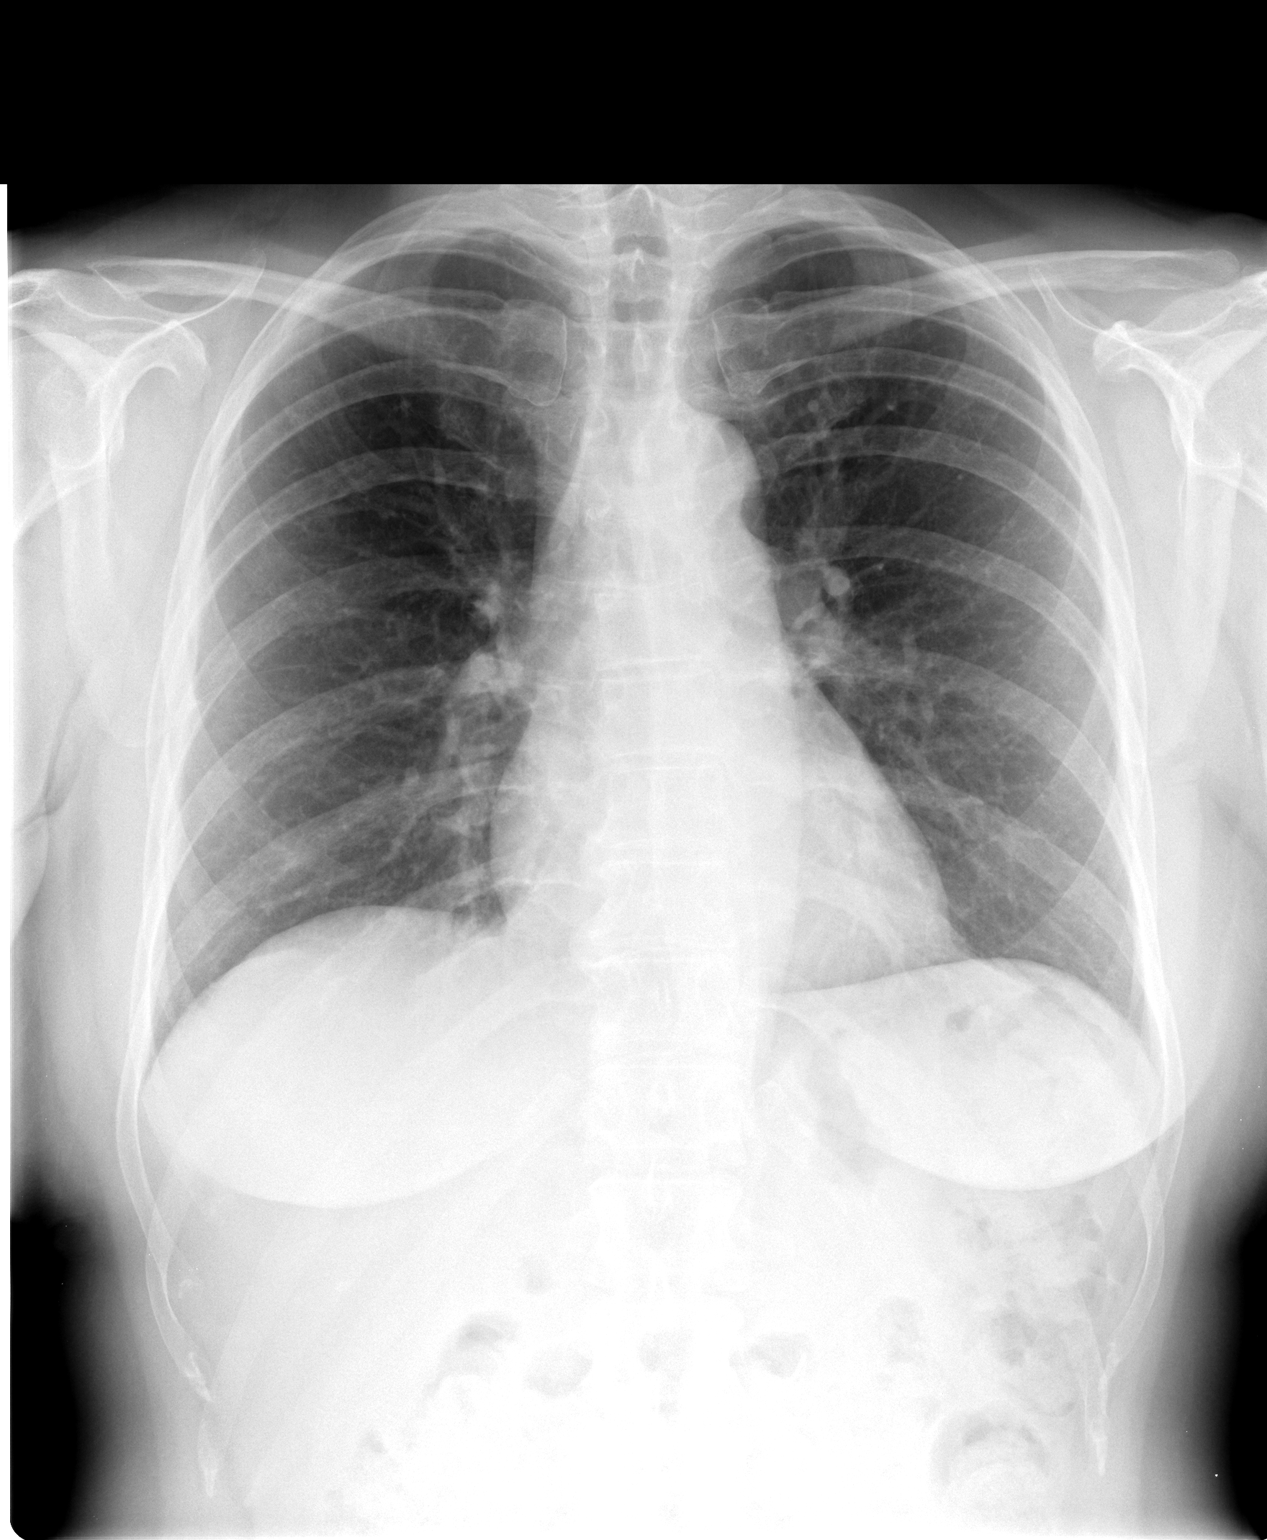

[view not recorded (2 of 2)]
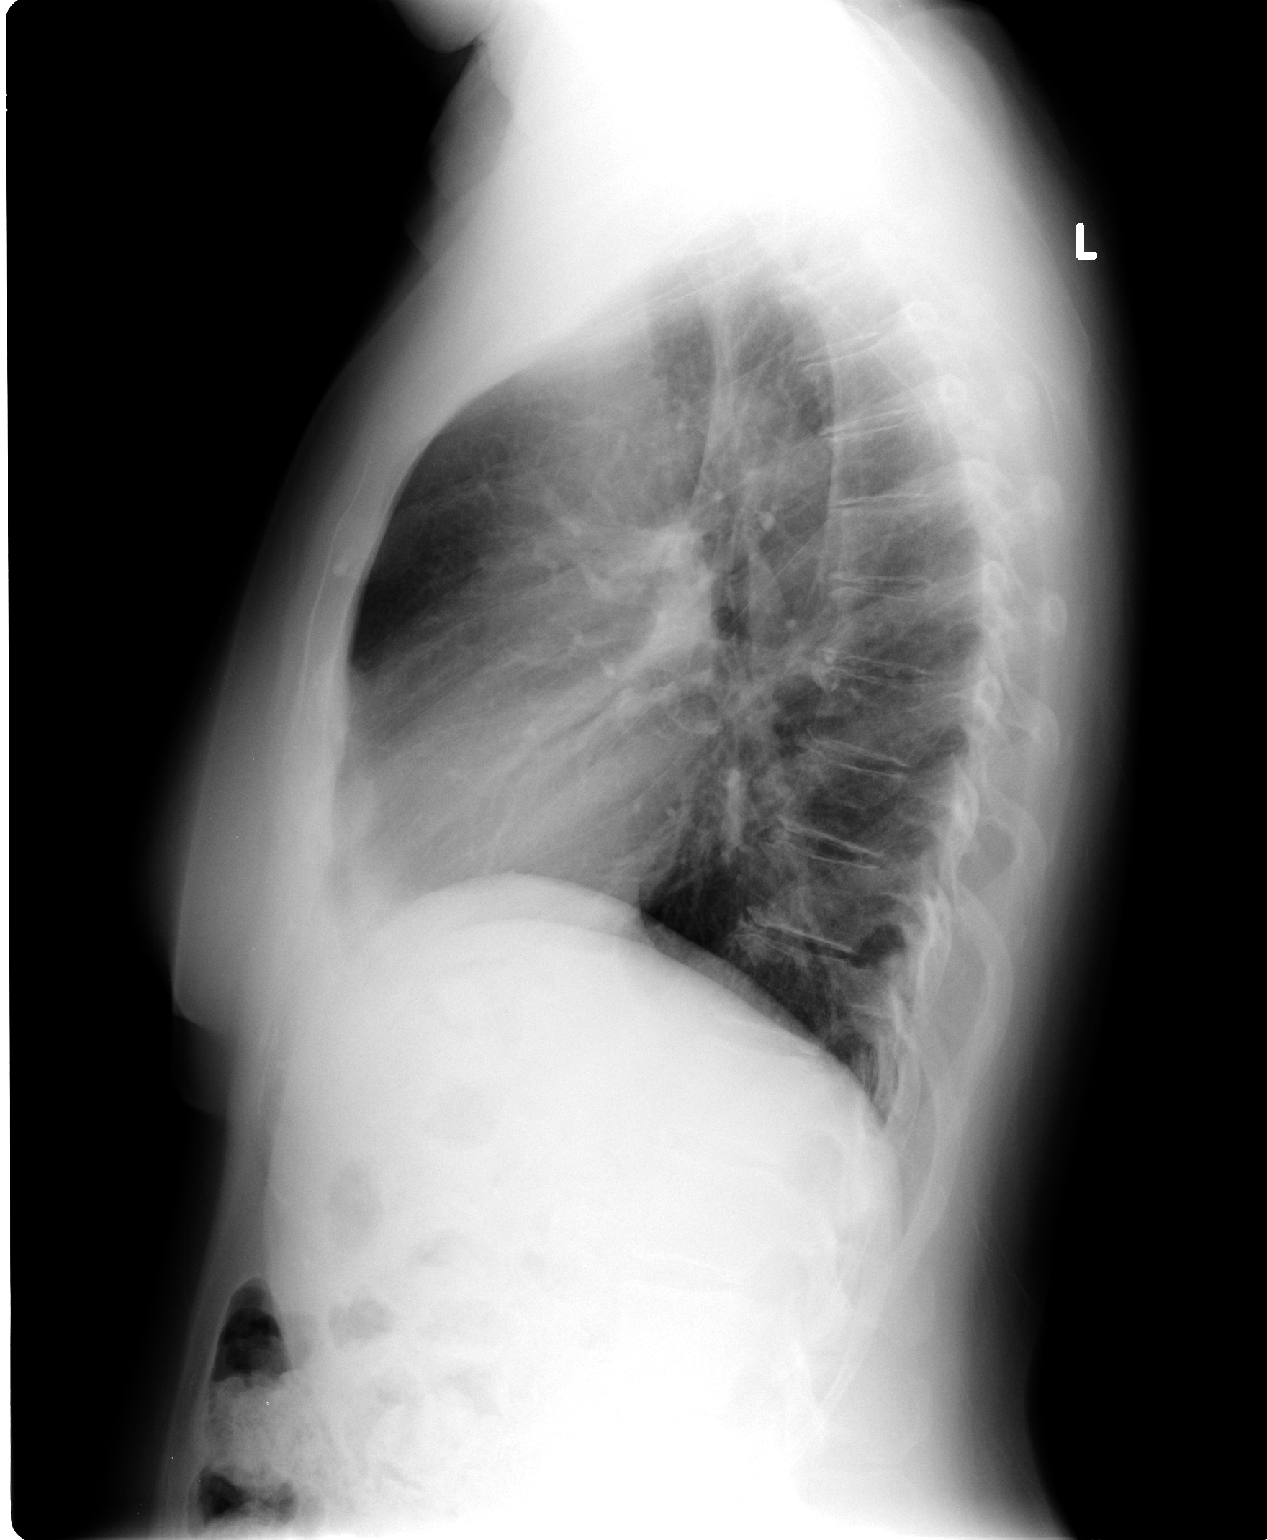

[2 of 2 positions shown; findings below may reference images not displayed]

FINDINGS: Cardiomediastinal silhouette is within normal limits. The
lungs are clear. No pleural effusion.  No pneumothorax.  No acute
osseous abnormality.
IMPRESSION: Normal chest.  Apparent interval resolution of right pleural
effusion according to plain film radiographic technique, which is
less sensitive than CT.

## 2014-04-13 ENCOUNTER — Encounter: Payer: Self-pay | Admitting: Internal Medicine

## 2014-04-16 ENCOUNTER — Other Ambulatory Visit: Payer: Self-pay | Admitting: Internal Medicine

## 2014-05-18 ENCOUNTER — Encounter: Payer: Self-pay | Admitting: Internal Medicine

## 2014-05-18 ENCOUNTER — Ambulatory Visit (INDEPENDENT_AMBULATORY_CARE_PROVIDER_SITE_OTHER): Payer: Managed Care, Other (non HMO) | Admitting: Internal Medicine

## 2014-05-18 ENCOUNTER — Other Ambulatory Visit (INDEPENDENT_AMBULATORY_CARE_PROVIDER_SITE_OTHER): Payer: Managed Care, Other (non HMO)

## 2014-05-18 VITALS — BP 134/82 | HR 66 | Temp 97.4°F | Ht 62.0 in | Wt 161.1 lb

## 2014-05-18 DIAGNOSIS — M5417 Radiculopathy, lumbosacral region: Secondary | ICD-10-CM

## 2014-05-18 DIAGNOSIS — E042 Nontoxic multinodular goiter: Secondary | ICD-10-CM

## 2014-05-18 DIAGNOSIS — IMO0002 Reserved for concepts with insufficient information to code with codable children: Secondary | ICD-10-CM

## 2014-05-18 DIAGNOSIS — N898 Other specified noninflammatory disorders of vagina: Secondary | ICD-10-CM

## 2014-05-18 DIAGNOSIS — N939 Abnormal uterine and vaginal bleeding, unspecified: Secondary | ICD-10-CM

## 2014-05-18 DIAGNOSIS — Z0000 Encounter for general adult medical examination without abnormal findings: Secondary | ICD-10-CM

## 2014-05-18 LAB — LIPID PANEL
CHOLESTEROL: 240 mg/dL — AB (ref 0–200)
HDL: 51.3 mg/dL (ref >39.00)
LDL CALC: 174 mg/dL — AB (ref 0–99)
NONHDL: 188.7
Total CHOL/HDL Ratio: 5
Triglycerides: 75 mg/dL (ref 0.0–149.0)
VLDL: 15 mg/dL (ref 0.0–40.0)

## 2014-05-18 LAB — CBC WITH DIFFERENTIAL/PLATELET
Basophils Absolute: 0 10*3/uL (ref 0.0–0.1)
Basophils Relative: 0.7 % (ref 0.0–3.0)
EOS PCT: 1.7 % (ref 0.0–5.0)
Eosinophils Absolute: 0.1 10*3/uL (ref 0.0–0.7)
HEMATOCRIT: 41 % (ref 36.0–46.0)
HEMOGLOBIN: 14 g/dL (ref 12.0–15.0)
LYMPHS ABS: 1.4 10*3/uL (ref 0.7–4.0)
Lymphocytes Relative: 28.8 % (ref 12.0–46.0)
MCHC: 34 g/dL (ref 30.0–36.0)
MCV: 89.1 fl (ref 78.0–100.0)
MONOS PCT: 8.6 % (ref 3.0–12.0)
Monocytes Absolute: 0.4 10*3/uL (ref 0.1–1.0)
NEUTROS ABS: 3 10*3/uL (ref 1.4–7.7)
Neutrophils Relative %: 60.2 % (ref 43.0–77.0)
Platelets: 221 10*3/uL (ref 150.0–400.0)
RBC: 4.6 Mil/uL (ref 3.87–5.11)
RDW: 13.2 % (ref 11.5–15.5)
WBC: 5 10*3/uL (ref 4.0–10.5)

## 2014-05-18 LAB — BASIC METABOLIC PANEL
BUN: 12 mg/dL (ref 6–23)
CO2: 30 mEq/L (ref 19–32)
Calcium: 10.1 mg/dL (ref 8.4–10.5)
Chloride: 102 mEq/L (ref 96–112)
Creatinine, Ser: 0.7 mg/dL (ref 0.4–1.2)
GFR: 91.08 mL/min (ref >60.00)
Glucose, Bld: 93 mg/dL (ref 70–99)
POTASSIUM: 4 meq/L (ref 3.5–5.1)
SODIUM: 138 meq/L (ref 135–145)

## 2014-05-18 LAB — HEPATIC FUNCTION PANEL
ALBUMIN: 3.9 g/dL (ref 3.5–5.2)
ALT: 20 U/L (ref 0–35)
AST: 26 U/L (ref 0–37)
Alkaline Phosphatase: 66 U/L (ref 39–117)
BILIRUBIN TOTAL: 0.8 mg/dL (ref 0.2–1.2)
Bilirubin, Direct: 0.1 mg/dL (ref 0.0–0.3)
Total Protein: 6.8 g/dL (ref 6.0–8.3)

## 2014-05-18 LAB — TSH: TSH: 2.35 u[IU]/mL (ref 0.35–4.50)

## 2014-05-18 MED ORDER — GABAPENTIN 100 MG PO CAPS
ORAL_CAPSULE | ORAL | Status: DC
Start: 2014-05-18 — End: 2014-10-02

## 2014-05-18 NOTE — Progress Notes (Signed)
Subjective:    Patient ID: Samantha Stark, female    DOB: 07-Sep-1956, 58 y.o.   MRN: [id number]  HPI She is here for complete physical examination. She has had some acute symptoms recently  On 05/11/14 in the process of flying  she experienced pain in the left inguinal area. This radiated to the back and down the left lower extremity anteriorly.It has persisted all week & is throbbing , up to a level 8. It is worse sitting. NSAIDs helped.   One month ago she did have vaginal bleeding for one day. She has PMH of total abdominal hysterectomy for adenomyosis. Ovaries remain. She has not had a recent gynecologic followup.   Review of Systems  The left inguinal discomfort was not associated with numbness, tingling, weakness of the leg. She also denied any loss of control of bladder or bowels.  Epistaxis, hemoptysis, hematuria, melena, or rectal bleeding denied. No unexplained weight loss, significant dyspepsia,dysphagia, or abdominal pain.  There is no abnormal bruising , bleeding, or difficulty stopping bleeding with injury.       Objective:   Physical Exam Gen.: Healthy and well-nourished in appearance. Alert, appropriate and cooperative throughout exam. Appears younger than stated age  Head: Normocephalic without obvious abnormalities;  Hair fine  Eyes: No corneal or conjunctival inflammation noted. Pupils dilated but equally round  & reactive to light and accommodation. Extraocular motion intact.  Ears: External  ear exam reveals no significant lesions or deformities. Canals clear .TMs normal. Hearing is grossly normal bilaterally. Nose: External nasal exam reveals no deformity or inflammation. Nasal mucosa are pink and moist. No lesions or exudates noted.   Mouth: Oral mucosa and oropharynx reveal no lesions or exudates. Teeth in good repair. Neck: No deformities, masses, or tenderness noted. Range of motion & Thyroid normal. Lungs: Normal respiratory effort; chest expands  symmetrically. Lungs are clear to auscultation without rales, wheezes, or increased work of breathing. Heart: Normal rate and rhythm. Normal S1 and S2. No gallop, click, or rub. No murmur. Abdomen: Bowel sounds normal; abdomen soft and nontender. No masses, organomegaly or hernias noted. Genitalia: as per Gyn                                  Musculoskeletal/extremities: No deformity or scoliosis noted of  the thoracic or lumbar spine.  No clubbing, cyanosis, edema, or significant extremity  deformity noted.  There is no pain with range of motion of the left lower extremity and hip. Straight leg raising is negative to 90. Tone & strength normal. Hand joints normal Fingernail health good. Able to lie down & sit up w/o help.  Vascular: Carotid, radial artery, dorsalis pedis and  posterior tibial pulses are full and equal. No bruits present. Neurologic: Alert and oriented x3. Deep tendon reflexes symmetrical and normal.  Gait normal  including heel & toe walking . Skin: Intact without suspicious lesions or rashes. Lymph: No cervical, axillary lymphadenopathy present. Psych: Mood and affect are normal. Normally interactive  Assessment & Plan:  #1 CPX #2 LS radiculopathy in context of long distant flying & PMH of LS disc suurgery #3 vaginal bleed See orders

## 2014-05-18 NOTE — Progress Notes (Signed)
Pre visit review using our clinic review tool, if applicable. No additional management support is needed unless otherwise documented below in the visit note. 

## 2014-05-18 NOTE — Patient Instructions (Signed)
The best exercises for the low back include freestyle swimming, stretch aerobics, and yoga.Cybex & Nautilus machines rather than dead weights are better for the back.   Your next office appointment will be determined based upon review of your pending labs . Those instructions will be transmitted to you through My Chart. Followup as needed for your acute issue. Please report any significant change in your symptoms.

## 2014-05-21 ENCOUNTER — Encounter: Payer: Self-pay | Admitting: Internal Medicine

## 2014-05-23 LAB — VITAMIN D 1,25 DIHYDROXY
VITAMIN D 1, 25 (OH) TOTAL: 77 pg/mL — AB (ref 18–72)
Vitamin D2 1, 25 (OH)2: <8 pg/mL
Vitamin D3 1, 25 (OH)2: 77 pg/mL

## 2014-05-26 ENCOUNTER — Other Ambulatory Visit: Payer: Self-pay | Admitting: Internal Medicine

## 2014-05-28 ENCOUNTER — Telehealth: Payer: Self-pay | Admitting: Certified Nurse Midwife

## 2014-05-28 NOTE — Telephone Encounter (Signed)
Spoke with patient. Patient states that 3 weeks ago she had "One day of vaginal bleeding which is when the pain started." Pain is on left side that comes and goes. Patient has not had any further vaginal bleeding. Patient saw PCP 2 weeks ago who reccommended she be seen with her GYN for evaluation and possible ultrasound. Patient had a hysterectomy 20 years ago. Ovaries were left with hysterectomy. Patient requesting to see Regina Eck CNM on 8/28 as she will be out of town until then. Advised Regina Eck CNM will be out of the office that day but I could get her in to see another provider. Patient agreeable. Appointment scheduled with Samantha Cage, FNP at 12:45pm. No available appointment with MD that day at this time.Patient agreeable to date and time. Advised if any further bleeding will need to be seen sooner and if any further pain will need to seek care where she is out of town. Patient agreeable.   Cc: Samantha Cage, FNP   Routing to provider for final review. Patient agreeable to disposition. Will close encounter

## 2014-05-28 NOTE — Telephone Encounter (Signed)
Patient said she is having pain in her left side where her ovary is started off and on for about 3 weeks said she had hysterectomy about 3 weeks ago. Said she had 1 day of bleeding after but since hasnt had any but the pain is coming and going. Pt last seen 2013. Said she has a crazy schedule but is not going to be back in town til later this month needs appt on 06/08/14 or the week of sept 6 she will be home that whole week.

## 2014-06-08 ENCOUNTER — Telehealth: Payer: Self-pay | Admitting: Nurse Practitioner

## 2014-06-08 ENCOUNTER — Ambulatory Visit: Payer: Self-pay | Admitting: Nurse Practitioner

## 2014-06-08 NOTE — Telephone Encounter (Signed)
Call to patient about missed appointment left message to reshedule.

## 2014-06-22 ENCOUNTER — Other Ambulatory Visit: Payer: Self-pay | Admitting: Internal Medicine

## 2014-06-22 MED ORDER — CYCLOBENZAPRINE HCL 5 MG PO TABS: ORAL_TABLET | ORAL | Status: DC

## 2014-06-22 NOTE — Telephone Encounter (Signed)
Notified pt can't get a year supply on her flexeril, pt stated she didn't need refill on flexeril she need refill on her lexapro. Inform pt will send refill for lexapro...Johny Chess

## 2014-06-22 NOTE — Telephone Encounter (Signed)
Pt called and is looking for a refill on cyclobenzaprine (FLEXERIL) 5 MG tablet .  She said that is suppose to be refills for a year.  She would like a call back to let her know what is going on with the refill.  Best number is [phone].  Thank you

## 2014-07-16 ENCOUNTER — Encounter: Payer: Self-pay | Admitting: Nurse Practitioner

## 2014-07-16 ENCOUNTER — Encounter: Payer: Self-pay | Admitting: Cardiovascular Disease

## 2014-07-23 NOTE — Telephone Encounter (Signed)
Please waive this fee per Dr. Sabra Heck. I left a detailed message on the patient's voicemail with this information--okay per DPR.

## 2014-09-04 ENCOUNTER — Ambulatory Visit: Payer: Self-pay | Admitting: Internal Medicine

## 2014-09-10 ENCOUNTER — Ambulatory Visit: Payer: Managed Care, Other (non HMO) | Admitting: Cardiovascular Disease

## 2014-09-26 ENCOUNTER — Encounter: Payer: Self-pay | Admitting: Internal Medicine

## 2014-09-26 ENCOUNTER — Other Ambulatory Visit: Payer: Self-pay | Admitting: Internal Medicine

## 2014-09-26 DIAGNOSIS — Z1231 Encounter for screening mammogram for malignant neoplasm of breast: Secondary | ICD-10-CM

## 2014-10-02 ENCOUNTER — Ambulatory Visit (HOSPITAL_COMMUNITY): Payer: Managed Care, Other (non HMO)

## 2014-10-02 ENCOUNTER — Encounter: Payer: Self-pay | Admitting: Internal Medicine

## 2014-10-02 ENCOUNTER — Ambulatory Visit (INDEPENDENT_AMBULATORY_CARE_PROVIDER_SITE_OTHER): Payer: Managed Care, Other (non HMO) | Admitting: Internal Medicine

## 2014-10-02 VITALS — BP 130/90 | HR 80 | Temp 98.1°F | Wt 160.0 lb

## 2014-10-02 DIAGNOSIS — F418 Other specified anxiety disorders: Secondary | ICD-10-CM

## 2014-10-02 MED ORDER — ESCITALOPRAM OXALATE 10 MG PO TABS: 10.0000 mg | ORAL_TABLET | Freq: Every day | ORAL | Status: DC

## 2014-10-02 NOTE — Progress Notes (Signed)
Pre visit review using our clinic review tool, if applicable. No additional management support is needed unless otherwise documented below in the visit note. 

## 2014-10-02 NOTE — Progress Notes (Signed)
   Subjective:    Patient ID: Samantha Stark, female    DOB: June 29, 1956, 58 y.o.   MRN: [id number]  HPI  She is experiencing emotional swings, mainly tearful episodes. Her son is moving to [place] with her four grandchildren. This includes twins age 42, a 8-year-old, and a 83-month-old. Her mother is 53 and now is in a nursing home. 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 She is in [place] which makes visiting difficult.  She previously was on generic Lexapro from her gynecologist with good response  She states her appetite is good; she's on gluten/ dairy restricted diet  She sleeps well because she takes calcium/ magnesium at bedtime.  She's walking 20-30 minutes a day without cardiopulmonary symptoms.  Family history is strongly positive for alcoholism in the  family. This includes her father, maternal grandfather, and several paternal uncles may have.  TSH was 2.35 in 8/15.  Review of Systems No significant change in weight; significant fatigue No blurred, double vision ,loss of vision No palpitations; racing; irregularity (prn med from Cardiology not needed) No constipation; diarrhea;hoarseness;dysphagia No change in nails,hair,skin No numbness or tingling; tremor No depression or panic attacks No temperature intolerance to heat ,cold  BP up because she ate beef last night; it causes fluid retention.       Objective:   Physical Exam   Gen.:  well-nourished; in no acute distress Eyes: Extraocular motion intact; no lid lag or proptosis ,no nystagmus. Pupils dilated Neck: full ROM; no masses ; thyroid normal  Heart: Normal rhythm and rate without significant murmur, gallop, or extra heart sounds Lungs: Chest clear to auscultation without rales,rales, wheezes Neuro:Deep tendon reflexes are equal and within normal limits; no tremor  Skin: Warm and dry without significant lesions or rashes; no onycholysis Lymphatic: no  cervical or axillary LA Psych: Normally communicative and interactive; no abnormal mood or affect clinically. Earful intermittently discussing family situation.        Assessment & Plan:  #1 situational anxiety The pathophysiology of neurotransmitter deficiency was discussed along with the benefits and potential adverse effects of SSRI therapy.

## 2014-10-02 NOTE — Patient Instructions (Addendum)
As we discussed the neurotransmitters are essential for good brain function, both intellectually & emotionally. The agents to increase the neurotransmitter levels are not addictive and simply keep this essential neurotransmitter at therapeutic levels. If these levels become severely depleted; depression or panic attacks can occur.    Minimal Blood Pressure Goal= AVERAGE < 140/90;  Ideal is an AVERAGE < 135/85. This AVERAGE should be calculated from @ least 5-7 BP readings taken @ different times of day on different days of week. You should not respond to isolated BP readings , but rather the AVERAGE for that week .Please bring your  blood pressure cuff to office visits to verify that it is reliable.It  can also be checked against the blood pressure device at the pharmacy. Finger or wrist cuffs are not dependable; an arm cuff is.

## 2014-10-10 ENCOUNTER — Telehealth: Payer: Self-pay

## 2014-10-10 NOTE — Telephone Encounter (Signed)
Done

## 2014-10-10 NOTE — Telephone Encounter (Signed)
-----   Message from Hendricks Limes, MD sent at 10/10/2014  5:06 AM EST ----- Please copy unread message to her about 3D mammograms. I reviewed chart ; no diagnosis found to support this. I need a supporting diagnostic code so insurance will cover (Ex : fibrocystic disease; S/P breast biopsy) .

## 2014-10-11 ENCOUNTER — Ambulatory Visit (HOSPITAL_COMMUNITY)
Admission: RE | Admit: 2014-10-11 | Discharge: 2014-10-11 | Disposition: A | Discharge status: Home / Self Care | Payer: Managed Care, Other (non HMO) | Source: Ambulatory Visit | Attending: Internal Medicine | Admitting: Internal Medicine

## 2014-10-11 DIAGNOSIS — Z1231 Encounter for screening mammogram for malignant neoplasm of breast: Secondary | ICD-10-CM | POA: Diagnosis present

## 2014-10-16 ENCOUNTER — Other Ambulatory Visit: Payer: Self-pay | Admitting: Internal Medicine

## 2014-10-24 NOTE — Progress Notes (Signed)
Patient ID: Samantha Stark, female   DOB: [birth year], 59 y.o.   MRN: [id number] 59 yo with history of benign palpitations. 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 Seen in 2013 after travel to   [place] had 2 nights of rapid pounding and palpitations. Does Ta Kwondo with no issues. No syncope chest pain or dyspnea. Notes lack of sleep and caffeine make palpitations more likely . No history of thyroid disease. Has not had bad episode since returning from travel. Palpitaitons are rapid skips and flip flops  F/U event monitor benign Thyroid studies have been normal   No presyncope Compliant with BP meds including beta blocker   Traveling to [place] now for work instead of [employer]  6 grandkids   ROS: Depressed and palpitations all other systems reviewed and negative   General: Affect appropriate Healthy:  appears stated age 59: normal Neck supple with no adenopathy JVP normal no bruits no thyromegaly Lungs clear with no wheezing and good diaphragmatic motion Heart:  S1/S2 no murmur, no rub, gallop or click PMI normal Abdomen: benighn, BS positve, no tenderness, no AAA no bruit.  No HSM or HJR Distal pulses intact with no bruits No edema Neuro non-focal Skin warm and dry No muscular weakness   Current Outpatient Prescriptions  Medication Sig Dispense Refill  . cyclobenzaprine (FLEXERIL) 5 MG tablet 1-2 qhs prn 30 tablet 0  . escitalopram (LEXAPRO) 10 MG tablet Take 1 tablet (10 mg total) by mouth daily. 30 tablet 5  . lisinopril (PRINIVIL,ZESTRIL) 10 MG tablet TAKE 1 TABLET BY MOUTH DAILY 90 tablet 3  . metoprolol succinate (TOPROL-XL) 25 MG 24 hr tablet Take 1 tablet (25 mg total) by mouth as needed. 30 tablet 6  . propranolol (INDERAL) 10 MG tablet Take one tablet by mouth as needed for palpitations up to 3 per day and at least 6 hours apart. 90 tablet 1  . spironolactone (ALDACTONE) 25 MG tablet TAKE 1 TABLET BY  MOUTH ONCE DAILY 90 tablet 1   No current facility-administered medications for this visit.    Allergies  Shrimp; Amlodipine besylate; Calcium channel blockers; Cephalexin; Codeine; Hydrochlorothiazide w-triamterene; and Statins  Electrocardiogram:  05/12/13  SR rate 61 normal  10/26/14  SR rate 69 normal   Assessment and Plan

## 2014-10-26 ENCOUNTER — Ambulatory Visit (INDEPENDENT_AMBULATORY_CARE_PROVIDER_SITE_OTHER): Payer: Managed Care, Other (non HMO) | Admitting: Cardiovascular Disease

## 2014-10-26 ENCOUNTER — Encounter: Payer: Self-pay | Admitting: Cardiovascular Disease

## 2014-10-26 VITALS — BP 118/82 | HR 69 | Ht 62.0 in | Wt 161.0 lb

## 2014-10-26 DIAGNOSIS — I10 Essential (primary) hypertension: Secondary | ICD-10-CM

## 2014-10-26 DIAGNOSIS — E782 Mixed hyperlipidemia: Secondary | ICD-10-CM

## 2014-10-26 DIAGNOSIS — R002 Palpitations: Secondary | ICD-10-CM

## 2014-10-26 NOTE — Assessment & Plan Note (Signed)
Driven by work stress and anxiety stable continue beta blocker

## 2014-10-26 NOTE — Assessment & Plan Note (Signed)
Cholesterol is at goal.  Continue current dose of statin and diet Rx.  No myalgias or side effects.  F/U  LFT's in 6 months. Lab Results  Component Value Date   LDLCALC 174* 05/18/2014

## 2014-10-26 NOTE — Patient Instructions (Signed)
Your physician wants you to follow-up in: YEAR WITH DR NISHAN  You will receive a reminder letter in the mail two months in advance. If you don't receive a letter, please call our office to schedule the follow-up appointment.  Your physician recommends that you continue on your current medications as directed. Please refer to the Current Medication list given to you today. 

## 2014-10-26 NOTE — Assessment & Plan Note (Signed)
Well controlled.  Continue current medications and low sodium Dash type diet.    

## 2014-11-21 ENCOUNTER — Ambulatory Visit (INDEPENDENT_AMBULATORY_CARE_PROVIDER_SITE_OTHER): Payer: Managed Care, Other (non HMO) | Admitting: Internal Medicine

## 2014-11-21 ENCOUNTER — Encounter: Payer: Self-pay | Admitting: Internal Medicine

## 2014-11-21 VITALS — BP 124/86 | HR 66 | Temp 97.9°F | Ht 62.0 in | Wt 163.0 lb

## 2014-11-21 DIAGNOSIS — R630 Anorexia: Secondary | ICD-10-CM

## 2014-11-21 DIAGNOSIS — Z8719 Personal history of other diseases of the digestive system: Secondary | ICD-10-CM

## 2014-11-21 DIAGNOSIS — R110 Nausea: Secondary | ICD-10-CM

## 2014-11-21 DIAGNOSIS — R194 Change in bowel habit: Secondary | ICD-10-CM

## 2014-11-21 DIAGNOSIS — R140 Abdominal distension (gaseous): Secondary | ICD-10-CM

## 2014-11-21 MED ORDER — RANITIDINE HCL 150 MG PO TABS: 150.0000 mg | ORAL_TABLET | Freq: Two times a day (BID) | ORAL | Status: DC

## 2014-11-21 NOTE — Patient Instructions (Signed)
Please take a probiotic , Florastor OR Align, every day until  the bowels are normal. This will replace the normal bacteria which  are necessary for formation of normal stool and processing of food.  Reflux of gastric acid may be asymptomatic as this may occur mainly during sleep.The triggers for reflux  include stress; the "aspirin family" ; alcohol; peppermint; and caffeine (coffee, tea, cola, and chocolate). The aspirin family would include aspirin and the nonsteroidal agents such as ibuprofen &  Naproxen. Tylenol would not cause reflux. If having symptoms ; food & drink should be avoided for @ least 2 hours before going to bed.

## 2014-11-21 NOTE — Progress Notes (Signed)
Pre visit review using our clinic review tool, if applicable. No additional management support is needed unless otherwise documented below in the visit note. 

## 2014-11-21 NOTE — Progress Notes (Signed)
   Subjective:    Patient ID: Samantha Stark, female    DOB: 1955/11/30, 58 y.o.   MRN: [id number]  HPI She describes GI symptoms of excessive gas, nausea, & frequent BMs. She also describes excess gas with associated bloating , flatus and belching. The symptoms are associated with some anorexia.  Typically she has 2 bowel movements a day; she is now having 4 BMs per day. The stools are described as softer and lighter but not loose-watery. Stools are not clay colored; urine is not Coke-colored.  She's had no definite exposures but she does travel frequently on [employer] as part of her job. She did eat some leftover meatloaf as only possible trigger. She is on a gluten and dairy free diet with avoidance of fried foods or grease. She took 2 aspirin for knee pain. When she travels she takes a full dose aspirin prophylactically.  She took candied ginger and TUMS for her symptoms without relief.  Past medical history of diverticulosis without hemorrhage. Diverticulitis 1993.Last colonoscopy 2011. Gyn care current. Family history: Polyps in her mother, brother, maternal aunt.Two aunts had cholecystectomy.   Review of Systems Unexplained weight loss, abdominal pain, significant dyspepsia, dysphagia, melena, rectal bleeding, or persistently small caliber stools are denied. Dysuria, pyuria, hematuria, frequency, nocturia or polyuria are denied. No Gyn symptoms present of pelvic pain or discharge.   She has had an isolated "sensation" in the left upper chest with radiation to the left upper extremity which is nonexertional. She saw her cardiologist 3 weeks ago and was given a "clean bill of health". 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    Objective:   Physical Exam  General appearance is one of good health and nourishment w/o distress. Hair is fine.  Eyes: No conjunctival inflammation or scleral icterus is present. Large pupils. Ptosis  bilaterally.  Oral exam: Dental hygiene is good; lips and gums are healthy appearing.There is no oropharyngeal erythema or exudate noted.   Heart:  Normal rate and regular rhythm. S1 and S2 normal without gallop, murmur, click, rub or other extra sounds     Lungs:Chest clear to auscultation; no wheezes, rhonchi,rales ,or rubs present.No increased work of breathing.   Abdomen: bowel sounds normal, soft and non-tender without masses, organomegaly or hernias noted.  No guarding or rebound . No tenderness over the flanks to percussion  Musculoskeletal: Able to lie flat and sit up without help. Negative straight leg raising bilaterally. Gait normal  Skin:Warm & dry.  Intact without suspicious lesions or rashes ; no jaundice or tenting  Lymphatic: No lymphadenopathy is noted about the head, neck, axilla            Assessment & Plan:  #1 nausea, bloating, anorexia, & bowel changes #2 atypical chest pain  R/O IBS variant +/- reflux component See orders & AVS

## 2014-12-13 ENCOUNTER — Other Ambulatory Visit: Payer: Self-pay

## 2014-12-13 DIAGNOSIS — F418 Other specified anxiety disorders: Secondary | ICD-10-CM

## 2014-12-13 MED ORDER — ESCITALOPRAM OXALATE 10 MG PO TABS: 10.0000 mg | ORAL_TABLET | Freq: Every day | ORAL | Status: DC

## 2014-12-13 NOTE — Telephone Encounter (Signed)
OK X 3 mos 

## 2014-12-13 NOTE — Telephone Encounter (Signed)
Patient request [facility] supply to [facility]

## 2015-01-09 ENCOUNTER — Other Ambulatory Visit: Payer: Self-pay | Admitting: Internal Medicine

## 2015-01-09 ENCOUNTER — Encounter: Payer: Self-pay | Admitting: Internal Medicine

## 2015-01-09 DIAGNOSIS — IMO0001 Reserved for inherently not codable concepts without codable children: Secondary | ICD-10-CM

## 2015-02-18 ENCOUNTER — Other Ambulatory Visit: Payer: Self-pay | Admitting: Internal Medicine

## 2015-02-18 NOTE — Telephone Encounter (Signed)
OK X 3 mos 

## 2015-05-15 ENCOUNTER — Encounter: Payer: Self-pay | Admitting: Internal Medicine

## 2015-05-15 ENCOUNTER — Other Ambulatory Visit: Payer: Self-pay | Admitting: Internal Medicine

## 2015-05-15 ENCOUNTER — Ambulatory Visit (INDEPENDENT_AMBULATORY_CARE_PROVIDER_SITE_OTHER): Payer: BLUE CROSS/BLUE SHIELD | Admitting: Internal Medicine

## 2015-05-15 ENCOUNTER — Other Ambulatory Visit (INDEPENDENT_AMBULATORY_CARE_PROVIDER_SITE_OTHER): Payer: BLUE CROSS/BLUE SHIELD

## 2015-05-15 VITALS — BP 128/65 | HR 84 | Temp 98.2°F | Resp 16 | Ht 62.0 in | Wt 158.0 lb

## 2015-05-15 DIAGNOSIS — Z0000 Encounter for general adult medical examination without abnormal findings: Secondary | ICD-10-CM

## 2015-05-15 DIAGNOSIS — Z0189 Encounter for other specified special examinations: Secondary | ICD-10-CM

## 2015-05-15 LAB — CBC WITH DIFFERENTIAL/PLATELET
BASOS ABS: 0 10*3/uL (ref 0.0–0.1)
Basophils Relative: 0.4 % (ref 0.0–3.0)
EOS ABS: 0.2 10*3/uL (ref 0.0–0.7)
EOS PCT: 1.5 % (ref 0.0–5.0)
HEMATOCRIT: 40 % (ref 36.0–46.0)
HEMOGLOBIN: 13.7 g/dL (ref 12.0–15.0)
LYMPHS ABS: 1.2 10*3/uL (ref 0.7–4.0)
LYMPHS PCT: 11.1 % — AB (ref 12.0–46.0)
MCHC: 34.2 g/dL (ref 30.0–36.0)
MCV: 88.8 fl (ref 78.0–100.0)
MONO ABS: 0.3 10*3/uL (ref 0.1–1.0)
Monocytes Relative: 3 % (ref 3.0–12.0)
NEUTROS ABS: 8.8 10*3/uL — AB (ref 1.4–7.7)
NEUTROS PCT: 84 % — AB (ref 43.0–77.0)
Platelets: 226 10*3/uL (ref 150.0–400.0)
RBC: 4.5 Mil/uL (ref 3.87–5.11)
RDW: 13.5 % (ref 11.5–15.5)
WBC: 10.5 10*3/uL (ref 4.0–10.5)

## 2015-05-15 LAB — HEPATIC FUNCTION PANEL
ALT: 16 U/L (ref 0–35)
AST: 20 U/L (ref 0–37)
Albumin: 4 g/dL (ref 3.5–5.2)
Alkaline Phosphatase: 56 U/L (ref 39–117)
BILIRUBIN DIRECT: 0.1 mg/dL (ref 0.0–0.3)
Total Bilirubin: 0.6 mg/dL (ref 0.2–1.2)
Total Protein: 6.8 g/dL (ref 6.0–8.3)

## 2015-05-15 LAB — BASIC METABOLIC PANEL
BUN: 16 mg/dL (ref 6–23)
CO2: 30 mEq/L (ref 19–32)
CREATININE: 0.68 mg/dL (ref 0.40–1.20)
Calcium: 10 mg/dL (ref 8.4–10.5)
Chloride: 102 mEq/L (ref 96–112)
GFR: 93.86 mL/min (ref >60.00)
Glucose, Bld: 94 mg/dL (ref 70–99)
POTASSIUM: 3.7 meq/L (ref 3.5–5.1)
Sodium: 138 mEq/L (ref 135–145)

## 2015-05-15 LAB — TSH: TSH: 2.77 u[IU]/mL (ref 0.35–4.50)

## 2015-05-15 NOTE — Progress Notes (Signed)
Pre visit review using our clinic review tool, if applicable. No additional management support is needed unless otherwise documented below in the visit note. 

## 2015-05-15 NOTE — Progress Notes (Signed)
   Subjective:    Patient ID: Samantha Stark, female    DOB: 04/01/1956, 59 y.o.   MRN: [id number]  HPI She is here for a physical;acute issues include some musculoskeletal flank pain for which she is seeing a chiropractor. She believes this is the reason for her elevated blood pressure.  She is on a heart healthy diet. She's been compliant with her medications without adverse effects. She is walking 3 times a week for at least 30 minutes without cardiopulmonary symptoms.  Blood pressure at home has ranged 135-140/90-94 recently.  Colonoscopy is current; she has no active GI symptoms. She's not using ranitidine.  Review of Systems  Chest pain, palpitations, tachycardia, exertional dyspnea, paroxysmal nocturnal dyspnea, claudication or edema are absent. No unexplained weight loss, abdominal pain, significant dyspepsia, dysphagia, melena, rectal bleeding, or persistently small caliber stools. Dysuria, pyuria, hematuria, frequency, nocturia or polyuria are denied. Change in hair, skin, nails denied. No bowel changes of constipation or diarrhea. No intolerance to heat .Intolerant to cold.      Objective:   Physical Exam  Pertinent or positive findings include: Her hair is very fine. She has some wax in otic canals. She has minor crepitus of the knees.  General appearance :adequately nourished; in no distress. BMI: 28.89  Eyes: No conjunctival inflammation or scleral icterus is present.  Oral exam:  Lips and gums are healthy appearing.There is no oropharyngeal erythema or exudate noted. Dental hygiene is good.  Heart:  Normal rate and regular rhythm. S1 and S2 normal without gallop, murmur, click, rub or other extra sounds    Lungs:Chest clear to auscultation; no wheezes, rhonchi,rales ,or rubs present.No increased work of breathing.   Abdomen: bowel sounds normal, soft and non-tender without masses, organomegaly or hernias noted.  No guarding or rebound.   Vascular : all pulses  equal ; no bruits present.  Skin:Warm & dry.  Intact without suspicious lesions or rashes ; no tenting or jaundice   Lymphatic: No lymphadenopathy is noted about head, neck, axilla   Neuro: Strength, tone & DTRs normal.        Assessment & Plan:  #1 comprehensive physical exam; no acute findings  Plan: see Orders  & Recommendations

## 2015-05-15 NOTE — Patient Instructions (Signed)
Minimal Blood Pressure Goal= AVERAGE < 140/90;  Ideal is an AVERAGE < 135/85. This AVERAGE should be calculated from @ least 5-7 BP readings taken @ different times of day on different days of week. You should not respond to isolated BP readings , but rather the AVERAGE for that week .Please bring your  blood pressure cuff to office visits to verify that it is reliable.It  can also be checked against the blood pressure device at the pharmacy. Finger or wrist cuffs are not dependable; an arm cuff is.  Your next office appointment will be determined based upon review of your pending labs  and  xrays  Those written interpretation of the lab results and instructions will be transmitted to you by My Chart  Critical results will be called.  Followup as needed for any active or acute issue. Please report any significant change in your symptoms.

## 2015-05-16 ENCOUNTER — Other Ambulatory Visit: Payer: Self-pay | Admitting: Internal Medicine

## 2015-05-16 ENCOUNTER — Telehealth: Payer: Self-pay | Admitting: Emergency Medicine

## 2015-05-16 ENCOUNTER — Encounter: Payer: Self-pay | Admitting: Internal Medicine

## 2015-05-16 ENCOUNTER — Other Ambulatory Visit (INDEPENDENT_AMBULATORY_CARE_PROVIDER_SITE_OTHER): Payer: BLUE CROSS/BLUE SHIELD

## 2015-05-16 DIAGNOSIS — R350 Frequency of micturition: Secondary | ICD-10-CM | POA: Diagnosis not present

## 2015-05-16 LAB — URINALYSIS
Bilirubin Urine: NEGATIVE
HGB URINE DIPSTICK: NEGATIVE
Ketones, ur: NEGATIVE
Leukocytes, UA: NEGATIVE
NITRITE: NEGATIVE
PH: 7.5 (ref 5.0–8.0)
SPECIFIC GRAVITY, URINE: 1.01 (ref 1.000–1.030)
TOTAL PROTEIN, URINE-UPE24: NEGATIVE
UROBILINOGEN UA: 0.2 (ref 0.0–1.0)
Urine Glucose: NEGATIVE

## 2015-05-16 NOTE — Telephone Encounter (Signed)
Pt called stating she is having symptoms of a bladder infection or UTI. She is urinating frequently. This started last night after her visit. Pt would like to come in today for a culture. Please advise

## 2015-05-16 NOTE — Telephone Encounter (Signed)
Orders entered

## 2015-05-16 NOTE — Telephone Encounter (Signed)
Informed pt that labs were in.

## 2015-05-17 LAB — NMR LIPOPROFILE WITH LIPIDS
Cholesterol, Total: 259 mg/dL — ABNORMAL HIGH (ref 100–199)
HDL Particle Number: 22.4 umol/L — ABNORMAL LOW (ref >=30.5)
HDL Size: 9.3 nm (ref >=9.2)
HDL-C: 52 mg/dL (ref >39)
LDL (calc): 183 mg/dL — ABNORMAL HIGH (ref 0–99)
LDL Particle Number: 1630 nmol/L — ABNORMAL HIGH (ref <1000)
LDL SIZE: 22 nm (ref >=20.8)
LP-IR Score: <25 (ref <=45)
Large HDL-P: 5.9 umol/L (ref >=4.8)
Large VLDL-P: 0.8 nmol/L (ref <=2.7)
Small LDL Particle Number: 362 nmol/L (ref <=527)
Triglycerides: 119 mg/dL (ref 0–149)
VLDL Size: 35.2 nm (ref <=46.6)

## 2015-05-17 LAB — CULTURE, URINE COMPREHENSIVE
COLONY COUNT: NO GROWTH
Organism ID, Bacteria: NO GROWTH

## 2015-05-21 ENCOUNTER — Encounter: Payer: Self-pay | Admitting: Internal Medicine

## 2015-05-24 ENCOUNTER — Other Ambulatory Visit: Payer: Self-pay | Admitting: Emergency Medicine

## 2015-05-24 ENCOUNTER — Ambulatory Visit: Payer: BLUE CROSS/BLUE SHIELD | Admitting: Internal Medicine

## 2015-05-24 MED ORDER — GABAPENTIN 100 MG PO CAPS: 100.0000 mg | ORAL_CAPSULE | Freq: Three times a day (TID) | ORAL | Status: DC | PRN

## 2015-05-24 MED ORDER — FAMCICLOVIR 500 MG PO TABS: 500.0000 mg | ORAL_TABLET | Freq: Three times a day (TID) | ORAL | Status: DC

## 2015-05-29 ENCOUNTER — Encounter: Payer: Self-pay | Admitting: Internal Medicine

## 2015-05-30 NOTE — Telephone Encounter (Signed)
Please advise 

## 2015-06-03 NOTE — Telephone Encounter (Signed)
Please advise 

## 2015-06-05 ENCOUNTER — Other Ambulatory Visit: Payer: Self-pay | Admitting: Internal Medicine

## 2015-06-06 ENCOUNTER — Other Ambulatory Visit: Payer: Self-pay | Admitting: Internal Medicine

## 2015-06-06 MED ORDER — GABAPENTIN 300 MG PO CAPS: 300.0000 mg | ORAL_CAPSULE | Freq: Three times a day (TID) | ORAL | Status: DC | PRN

## 2015-06-06 NOTE — Telephone Encounter (Signed)
Received call from pt she states she had sent md email concerning the shingles. MD ok for the Gabapentin to be increase to 300 mg. Sent another email stating the gabapentin 300 mg was working for her. She has not heard back nor the gabapentin 300 mg has not been call to pharmacy. She had ask for refill on the Gabapentin 100 mg pharmacy still haven't receive back yet. l look back in chart (emails) md did agree to change to 300 mg. Inform pt the refill for the 100 mg has came through, and I will send a new rx for the 300 mg...Johny Chess

## 2015-06-07 ENCOUNTER — Other Ambulatory Visit: Payer: Self-pay | Admitting: Emergency Medicine

## 2015-06-07 MED ORDER — GABAPENTIN 100 MG PO CAPS: 100.0000 mg | ORAL_CAPSULE | Freq: Three times a day (TID) | ORAL | Status: DC | PRN

## 2015-06-07 NOTE — Addendum Note (Signed)
Addended by: Earnstine Regal on: 06/07/2015 04:32 PM   Modules accepted: Orders

## 2015-09-24 ENCOUNTER — Encounter: Payer: Self-pay | Admitting: Cardiovascular Disease

## 2015-09-24 ENCOUNTER — Telehealth: Payer: Self-pay | Admitting: Cardiovascular Disease

## 2015-09-24 NOTE — Telephone Encounter (Signed)
New Message  This message is to inform you that we have made 3 consecutive attempts to contact the patient since 08/12/2015. We have also mailed a letter to the patient to inform them to call in and schedule. Although we were unsuccessful in these attempts we wanted you to be aware of our efforts. Will remove the patient from our recall list at this time    Jarold Motto [facility]

## 2015-09-25 NOTE — Telephone Encounter (Signed)
NOTED ./CY 

## 2015-10-08 ENCOUNTER — Ambulatory Visit: Payer: BLUE CROSS/BLUE SHIELD | Admitting: Family Medicine

## 2015-10-09 ENCOUNTER — Ambulatory Visit: Payer: BLUE CROSS/BLUE SHIELD | Admitting: Family Medicine

## 2015-10-29 ENCOUNTER — Other Ambulatory Visit: Payer: Self-pay | Admitting: Internal Medicine

## 2015-10-30 ENCOUNTER — Other Ambulatory Visit: Payer: Self-pay | Admitting: Internal Medicine

## 2015-10-30 ENCOUNTER — Encounter: Payer: Self-pay | Admitting: Internal Medicine

## 2015-11-08 MED ORDER — ESCITALOPRAM OXALATE 10 MG PO TABS: 10.0000 mg | ORAL_TABLET | Freq: Every day | ORAL | Status: DC

## 2015-11-08 NOTE — Addendum Note (Signed)
Addended by: Earnstine Regal on: 11/08/2015 03:37 PM   Modules accepted: Orders

## 2015-11-15 ENCOUNTER — Other Ambulatory Visit: Payer: Self-pay

## 2015-11-15 ENCOUNTER — Ambulatory Visit (INDEPENDENT_AMBULATORY_CARE_PROVIDER_SITE_OTHER): Payer: 59 | Admitting: Primary Care

## 2015-11-15 ENCOUNTER — Encounter: Payer: Self-pay | Admitting: Primary Care

## 2015-11-15 VITALS — BP 124/84 | HR 71 | Temp 98.1°F | Ht 62.0 in | Wt 160.8 lb

## 2015-11-15 DIAGNOSIS — Z1239 Encounter for other screening for malignant neoplasm of breast: Secondary | ICD-10-CM

## 2015-11-15 DIAGNOSIS — E782 Mixed hyperlipidemia: Secondary | ICD-10-CM | POA: Diagnosis not present

## 2015-11-15 DIAGNOSIS — Z1231 Encounter for screening mammogram for malignant neoplasm of breast: Secondary | ICD-10-CM

## 2015-11-15 DIAGNOSIS — I10 Essential (primary) hypertension: Secondary | ICD-10-CM | POA: Diagnosis not present

## 2015-11-15 DIAGNOSIS — R002 Palpitations: Secondary | ICD-10-CM

## 2015-11-15 DIAGNOSIS — Z23 Encounter for immunization: Secondary | ICD-10-CM

## 2015-11-15 DIAGNOSIS — F411 Generalized anxiety disorder: Secondary | ICD-10-CM | POA: Diagnosis not present

## 2015-11-15 MED ORDER — LISINOPRIL 10 MG PO TABS: 10.0000 mg | ORAL_TABLET | Freq: Every day | ORAL | Status: DC

## 2015-11-15 MED ORDER — ESCITALOPRAM OXALATE 10 MG PO TABS: 10.0000 mg | ORAL_TABLET | Freq: Every day | ORAL | Status: DC

## 2015-11-15 NOTE — Assessment & Plan Note (Signed)
Uses propranolol for brief episodes, Toprol XL for prolonged episodes. No recent palpitations. Not required meds in 1 year.

## 2015-11-15 NOTE — Assessment & Plan Note (Signed)
Lipid panel above goal in August 2016.  Unable to tolerate statin therapy. Is not interested in lipid lowering medications. Will recheck during physical in August 2017. Discussed the importance of a healthy diet and regular exercise in order for weight loss and to reduce risk of other medical diseases.

## 2015-11-15 NOTE — Progress Notes (Signed)
Pre visit review using our clinic review tool, if applicable. No additional management support is needed unless otherwise documented below in the visit note. 

## 2015-11-15 NOTE — Patient Instructions (Addendum)
I've placed the order for your mammogram at the [facility]. You may call them to schedule.  I've refilled your Lexapro and Lisinopril.  Please schedule a physical with me in mid August 2017. You may also schedule a lab only appointment 3-4 days prior. We will discuss your lab results in detail during your physical. We will do your pap and schedule your bone density testing at that point.  It was a pleasure to meet you today! Please don't hesitate to call me with any questions. Welcome to [facility]!

## 2015-11-15 NOTE — Addendum Note (Signed)
Addended by: Jacqualin Combes on: 11/15/2015 11:08 AM   Modules accepted: Orders

## 2015-11-15 NOTE — Assessment & Plan Note (Signed)
Stable today, continue current regimen. Uses spironolactone once weekly on average for lower extremity edema. Uses Toprol XL and propranolol very sparingly for palpitations, never together.

## 2015-11-15 NOTE — Assessment & Plan Note (Signed)
Stable on lexapro 10 mg.  Stress with work typically. Feels well managed, continue same.

## 2015-11-15 NOTE — Progress Notes (Signed)
Subjective:    Patient ID: Samantha Stark, female    DOB: Sep 28, 1956, 60 y.o.   MRN: [id number]  HPI  Samantha Stark is a 60 year old female who presents today to transfer care and discuss the problems mentioned below. Will review old records. Her last physical was in August of 2016.   1) Essential Hypertension: Currently managed on lisinopril 10 mg daily and spironolactone 25 mg as needed for fluid retention. She will take the spironolactone once weekly on average. She travels on planes frequently and will remain in dependant positions regularly.    2) Generalized Anxiety Disorder: Diagnosed 7 years ago and is currently managed on Lexapro 10 mg. She's been consistent on this medication for the past 3 years. She feels well managed at this dose. Denies panic attacks.  3) Hyperlipidemia: History of and is not managed on medication. She was trialed on numerous different statin medications in the past but could not tolerate due to myalgias. She tries to control her cholesterol with a healthy diet. She does not currently exercise. Lipid panel in August 2016 with LDL of 183 and TC of 259. She is not interested in treatment at this time.  4) Palpitations: Diagnosed 5 years ago. They are intermittent with stress. She is managed on propranolol 10 mg and Toprol XL. She will start by taking the propranolol then will use Toprol XL if no improvement. She's last required these medications 1 year ago. Rarely uses.   Review of Systems  Constitutional: Negative for unexpected weight change.  HENT: Negative for rhinorrhea.   Respiratory: Negative for cough and shortness of breath.   Cardiovascular: Negative for chest pain and palpitations.  Gastrointestinal: Negative for diarrhea and constipation.  Genitourinary: Negative for difficulty urinating.  Musculoskeletal: Negative for myalgias and arthralgias.  Skin: Negative for rash.  Neurological: Negative for dizziness, numbness and headaches.    Psychiatric/Behavioral:       See HPI       Past Medical History  Diagnosis Date  . Hyperlipidemia   . Hypertension   . Anxiety     Situational  . Diverticulosis   . Chest pain 2006 & 2008    Negative nuclear stress study, Dr. [name]  . MVP (mitral valve prolapse)     Social History   Social History  . Marital Status: Married    Spouse Name: N/A  . Number of Children: N/A  . Years of Education: N/A   Occupational History  . Consultant    Social History Main Topics  . Smoking status: Never Smoker   . Smokeless tobacco: Not on file  . Alcohol Use: 1.8 oz/week    3 Glasses of wine per week     Comment: wine 3 times per wk  . Drug Use: No  . Sexual Activity:    Partners: Male   Other Topics Concern  . Not on file   Social History Narrative   Married.   2 children, 6 grandchildren.   Regular exercise: yes   Caffeine use: daily   Enjoys reading, gardening, sports, teaching yoga.    Past Surgical History  Procedure Laterality Date  . Colonoscopy  2008 & 2011     GI, negative except for Tics  . Spine surgery  1992    Lumbar laminectomy L5-S1  . Tonsillectomy    . Knee arthroscopy      Bilaterally  . Rotator cuff repair  2010    Right shoulder; Dr. Theda Sers    Family  History  Problem Relation Age of Onset  . Osteoporosis Maternal Grandmother   . Colon polyps Mother   . Hyperlipidemia Mother   . Goiter Mother     also Rosalee Kaufman  . Colon polyps Brother   . Hyperlipidemia Brother     X2  . Colon polyps Maternal Aunt   . Diabetes Maternal Aunt     X2  . Alcohol abuse Father   . Hyperlipidemia Sister   . Heart attack Maternal Uncle     MI in 50s  . Stroke Maternal Grandfather     in 69s  . Asthma Mother   . Alzheimer's disease Maternal Aunt     Allergies  Allergen Reactions  . Shrimp [Shellfish Allergy]     Angioedema caused by shrimp as well as oysters. EpiPen current  . Amlodipine Besylate     REACTION: edema, fatigue  .  Calcium Channel Blockers     REACTION: edema , fatigue  . Cephalexin     REACTION: diarrhea  . Codeine     REACTION: nausea  . Hydrochlorothiazide W-Triamterene     REACTION: low potassium  . Statins     Leg cramps with Lipitor & Pravachol    Current Outpatient Prescriptions on File Prior to Visit  Medication Sig Dispense Refill  . cyclobenzaprine (FLEXERIL) 5 MG tablet 1-2 qhs prn (Patient taking differently: Take by mouth. 1-2 qhs prn) 30 tablet 0  . metoprolol succinate (TOPROL-XL) 25 MG 24 hr tablet Take 1 tablet (25 mg total) by mouth as needed. 30 tablet 6  . propranolol (INDERAL) 10 MG tablet Take one tablet by mouth as needed for palpitations up to 3 per day and at least 6 hours apart. 90 tablet 1  . ranitidine (ZANTAC) 150 MG tablet Take 1 tablet (150 mg total) by mouth 2 (two) times daily. 60 tablet 2  . spironolactone (ALDACTONE) 25 MG tablet TAKE 1 TABLET BY MOUTH ONCE DAILY 90 tablet 1   No current facility-administered medications on file prior to visit.    BP 124/84 mmHg  Pulse 71  Temp(Src) 98.1 F (36.7 C) (Oral)  Ht 5\' 2"  (1.575 m)  Wt 160 lb 12.8 oz (72.938 kg)  BMI 29.40 kg/m2  SpO2 98%    Objective:   Physical Exam  Constitutional: She appears well-nourished.  Cardiovascular: Normal rate and regular rhythm.   Pulmonary/Chest: Effort normal and breath sounds normal.  Skin: Skin is warm and dry.  Psychiatric: She has a normal mood and affect.          Assessment & Plan:  Transfer Care:  Transferring from Dr. Linna Darner. Reviewed PMH, FH, SH. Refills provided for lisinopril and lexapro Will get her scheduled for CPE in August 2017.

## 2015-11-25 NOTE — Progress Notes (Signed)
Patient ID: Samantha Stark, female   DOB: [birth year], 60 y.o.   MRN: [id number] 60 y.o.  with history of benign palpitations. 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 Seen in 2013 after travel to   [place] had 2 nights of rapid pounding and palpitations. Does Ta Kwondo with no issues. No syncope chest pain or dyspnea. Notes lack of sleep and caffeine make palpitations more likely . No history of thyroid disease. Has not had bad episode since returning from travel. Palpitaitons are rapid skips and flip flops  F/U event monitor benign Thyroid studies have been normal   No presyncope Compliant with BP meds including beta blocker   Traveling to [place] now for work instead of [employer]  6 grandkids   ROS: Depressed and palpitations all other systems reviewed and negative   General: Affect appropriate Healthy:  appears stated age 60: normal Neck supple with no adenopathy JVP normal no bruits no thyromegaly Lungs clear with no wheezing and good diaphragmatic motion Heart:  S1/S2 no murmur, no rub, gallop or click PMI normal Abdomen: benighn, BS positve, no tenderness, no AAA no bruit.  No HSM or HJR Distal pulses intact with no bruits No edema Neuro non-focal Skin warm and dry No muscular weakness   Current Outpatient Prescriptions  Medication Sig Dispense Refill  . cyclobenzaprine (FLEXERIL) 5 MG tablet 1-2 qhs prn (Patient taking differently: Take by mouth. 1-2 qhs prn) 30 tablet 0  . escitalopram (LEXAPRO) 10 MG tablet Take 1 tablet (10 mg total) by mouth daily. 90 tablet 2  . lisinopril (PRINIVIL,ZESTRIL) 10 MG tablet Take 1 tablet (10 mg total) by mouth daily. 90 tablet 2  . metoprolol succinate (TOPROL-XL) 25 MG 24 hr tablet Take 1 tablet (25 mg total) by mouth as needed. 30 tablet 6  . propranolol (INDERAL) 10 MG tablet Take one tablet by mouth as needed for palpitations up to 3 per day and at least 6 hours  apart. 90 tablet 1  . ranitidine (ZANTAC) 150 MG tablet Take 1 tablet (150 mg total) by mouth 2 (two) times daily. 60 tablet 2  . spironolactone (ALDACTONE) 25 MG tablet TAKE 1 TABLET BY MOUTH ONCE DAILY 90 tablet 1   No current facility-administered medications for this visit.    Allergies  Shrimp; Amlodipine besylate; Calcium channel blockers; Cephalexin; Codeine; Hydrochlorothiazide w-triamterene; and Statins  Electrocardiogram:  05/12/13  SR rate 61 normal  10/26/14  SR rate 69 normal   Assessment and Plan  Palpitations GERD: HTN:   Jenkins Rouge

## 2015-12-02 ENCOUNTER — Encounter: Payer: 59 | Admitting: Cardiovascular Disease

## 2015-12-04 ENCOUNTER — Encounter: Payer: Self-pay | Admitting: Cardiovascular Disease

## 2015-12-13 ENCOUNTER — Ambulatory Visit: Payer: 59

## 2016-01-02 ENCOUNTER — Encounter: Payer: Self-pay | Admitting: Gastroenterology

## 2016-01-03 ENCOUNTER — Ambulatory Visit: Payer: 59

## 2016-01-20 ENCOUNTER — Encounter: Payer: Self-pay | Admitting: Gastroenterology

## 2016-01-28 ENCOUNTER — Ambulatory Visit (INDEPENDENT_AMBULATORY_CARE_PROVIDER_SITE_OTHER): Payer: 59 | Admitting: Internal Medicine

## 2016-01-28 ENCOUNTER — Encounter: Payer: Self-pay | Admitting: Internal Medicine

## 2016-01-28 VITALS — BP 120/76 | HR 74 | Temp 97.8°F | Wt 165.0 lb

## 2016-01-28 DIAGNOSIS — N3000 Acute cystitis without hematuria: Secondary | ICD-10-CM | POA: Diagnosis not present

## 2016-01-28 LAB — POC URINALSYSI DIPSTICK (AUTOMATED)
Bilirubin, UA: NEGATIVE
GLUCOSE UA: NEGATIVE
KETONES UA: NEGATIVE
Nitrite, UA: NEGATIVE
Protein, UA: NEGATIVE
SPEC GRAV UA: 1.02
UROBILINOGEN UA: 4
pH, UA: 6

## 2016-01-28 MED ORDER — SULFAMETHOXAZOLE-TRIMETHOPRIM 800-160 MG PO TABS: 1.0000 | ORAL_TABLET | Freq: Two times a day (BID) | ORAL | Status: DC

## 2016-01-28 NOTE — Assessment & Plan Note (Signed)
Some leukocytes in urine Nothing to suggest stone Will treat for 3 days empirically--- send C&S only if symptoms persist

## 2016-01-28 NOTE — Addendum Note (Signed)
Addended by: Pilar Grammes on: 01/28/2016 08:43 AM   Modules accepted: Orders

## 2016-01-28 NOTE — Patient Instructions (Signed)
Please start the antibiotic. If your symptoms are gone by tomorrow, you can stop it after 3 days.

## 2016-01-28 NOTE — Progress Notes (Signed)
Subjective:    Patient ID: Samantha Stark, female    DOB: Mar 04, 1956, 60 y.o.   MRN: [id number]  HPI Here due to urinary symptoms  Has had some low back pain for 4 days and pain with urination Back pain is low left back Tylenol helped the back  No hematuria Dysuria and increased frequency No incontinence No fever  Right ear bothering her since last night Achy and deep pain ??allergies---also has colored mucus and frontal headaches Has been holding meds due to pending allergy eval today  Current Outpatient Prescriptions on File Prior to Visit  Medication Sig Dispense Refill  . cyclobenzaprine (FLEXERIL) 5 MG tablet 1-2 qhs prn (Patient taking differently: Take by mouth. 1-2 qhs prn) 30 tablet 0  . escitalopram (LEXAPRO) 10 MG tablet Take 1 tablet (10 mg total) by mouth daily. 90 tablet 2  . lisinopril (PRINIVIL,ZESTRIL) 10 MG tablet Take 1 tablet (10 mg total) by mouth daily. 90 tablet 2  . metoprolol succinate (TOPROL-XL) 25 MG 24 hr tablet Take 1 tablet (25 mg total) by mouth as needed. 30 tablet 6  . propranolol (INDERAL) 10 MG tablet Take one tablet by mouth as needed for palpitations up to 3 per day and at least 6 hours apart. 90 tablet 1  . spironolactone (ALDACTONE) 25 MG tablet TAKE 1 TABLET BY MOUTH ONCE DAILY 90 tablet 1   No current facility-administered medications on file prior to visit.    Allergies  Allergen Reactions  . Shrimp [Shellfish Allergy]     Angioedema caused by shrimp as well as oysters. EpiPen current  . Amlodipine Besylate     REACTION: edema, fatigue  . Calcium Channel Blockers     REACTION: edema , fatigue  . Cephalexin     REACTION: diarrhea  . Codeine     REACTION: nausea  . Hydrochlorothiazide W-Triamterene     REACTION: low potassium  . Statins     Leg cramps with Lipitor & Pravachol    Past Medical History  Diagnosis Date  . Hyperlipidemia   . Hypertension   . Anxiety     Situational  . Diverticulosis   . Chest pain 2006  & 2008    Negative nuclear stress study, Dr. [name]  . MVP (mitral valve prolapse)     Past Surgical History  Procedure Laterality Date  . Colonoscopy  2008 & 2011    [facility], negative except for Tics  . Spine surgery  1992    Lumbar laminectomy L5-S1  . Tonsillectomy    . Knee arthroscopy      Bilaterally  . Rotator cuff repair  2010    Right shoulder; Dr. Theda Sers    Family History  Problem Relation Age of Onset  . Osteoporosis Maternal Grandmother   . Colon polyps Mother   . Hyperlipidemia Mother   . Goiter Mother     also Rosalee Kaufman  . Colon polyps Brother   . Hyperlipidemia Brother     X2  . Colon polyps Maternal Aunt   . Diabetes Maternal Aunt     X2  . Alcohol abuse Father   . Hyperlipidemia Sister   . Heart attack Maternal Uncle     MI in 27s  . Stroke Maternal Grandfather     in 25s  . Asthma Mother   . Alzheimer's disease Maternal Aunt     Social History   Social History  . Marital Status: Married    Spouse Name: N/A  .  Number of Children: N/A  . Years of Education: N/A   Occupational History  . Consultant    Social History Main Topics  . Smoking status: Never Smoker   . Smokeless tobacco: Not on file  . Alcohol Use: 1.8 oz/week    3 Glasses of wine per week     Comment: wine 3 times per wk  . Drug Use: No  . Sexual Activity:    Partners: Male   Other Topics Concern  . Not on file   Social History Narrative   Married.   2 children, 6 grandchildren.   Regular exercise: yes   Caffeine use: daily   Enjoys reading, gardening, sports, teaching yoga.   Review of Systems No history of kidney stones No shakes or chills No N/V Appetite is okay    Objective:   Physical Exam  HENT:  Mouth/Throat: Oropharynx is clear and moist. No oropharyngeal exudate.  TMs normal Moderate nasal congestion  Abdominal: Soft. She exhibits no distension and no mass. There is no rebound and no guarding.  Mild left suprapubic tenderness    Musculoskeletal:  No back tenderness          Assessment & Plan:

## 2016-01-28 NOTE — Progress Notes (Signed)
Pre visit review using our clinic review tool, if applicable. No additional management support is needed unless otherwise documented below in the visit note. 

## 2016-02-13 ENCOUNTER — Other Ambulatory Visit: Payer: Self-pay | Admitting: Internal Medicine

## 2016-02-13 DIAGNOSIS — R600 Localized edema: Secondary | ICD-10-CM

## 2016-02-14 NOTE — Telephone Encounter (Signed)
Routing to patient's new pcp to handle---former dr hopper patient

## 2016-02-29 NOTE — Progress Notes (Signed)
Patient ID: Samantha Stark, female   DOB: [birth year], 60 y.o.   MRN: [id number]   60 y.o.  with history of benign palpitations. 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 Seen in 2013 after travel to   [place] had 2 nights of rapid pounding and palpitations. Does [employer] with no issues. No syncope chest pain or dyspnea. Notes lack of sleep and caffeine make palpitations more likely . No history of thyroid disease. Has not had bad episode since returning from travel. Palpitaitons are rapid skips and flip flops  F/U event monitor benign Thyroid studies have been normal   No presyncope Compliant with BP meds including beta blocker   Traveling to [place] now for [employer]   6 grandkids  Two here and 4 in [place]  ROS: Depressed and palpitations all other systems reviewed and negative   General: Affect appropriate Healthy:  appears stated age 60: normal Neck supple with no adenopathy JVP normal no bruits no thyromegaly Lungs clear with no wheezing and good diaphragmatic motion Heart:  S1/S2 no murmur, no rub, gallop or click PMI normal Abdomen: benighn, BS positve, no tenderness, no AAA no bruit.  No HSM or HJR Distal pulses intact with no bruits No edema Neuro non-focal Skin warm and dry No muscular weakness   Current Outpatient Prescriptions  Medication Sig Dispense Refill  . cyclobenzaprine (FLEXERIL) 5 MG tablet Take 5-10 mg by mouth 3 (three) times daily as needed for muscle spasms.    Marland Kitchen EPINEPHrine 0.3 mg/0.3 mL IJ SOAJ injection Inject 0.3 mg into the muscle as directed.    . escitalopram (LEXAPRO) 10 MG tablet Take 1 tablet (10 mg total) by mouth daily. 90 tablet 2  . lisinopril (PRINIVIL,ZESTRIL) 10 MG tablet Take 1 tablet (10 mg total) by mouth daily. 90 tablet 2  . metoprolol succinate (TOPROL-XL) 25 MG 24 hr tablet Take 1 tablet (25 mg total) by mouth as needed. 30 tablet 6  . propranolol (INDERAL) 10  MG tablet Take one tablet by mouth as needed for palpitations up to 3 per day and at least 6 hours apart. 90 tablet 1  . spironolactone (ALDACTONE) 25 MG tablet TAKE 1 TABLET BY MOUTH ONCE DAILY 90 tablet 0  . sulfamethoxazole-trimethoprim (BACTRIM DS,SEPTRA DS) 800-160 MG tablet Take 1 tablet by mouth 2 (two) times daily. 14 tablet 0   No current facility-administered medications for this visit.    Allergies  Shrimp; Amlodipine besylate; Calcium channel blockers; Cephalexin; Codeine; Hydrochlorothiazide w-triamterene; and Statins  Electrocardiogram:  05/12/13  SR rate 61 normal  10/26/14  SR rate 69 normal   03/02/16  SR ate 73 normal   Assessment and Plan  Palpitations : benign infrequent observe HTN: Well controlled.  Continue current medications and low sodium Dash type diet.   Elevated Liipids  Has had leg pain with lipitor, pravachol and one other statin don't think she has tried crestor Will order coronary calcium score and update labs then have her see lipid clinc             Jenkins Rouge

## 2016-03-03 ENCOUNTER — Ambulatory Visit (INDEPENDENT_AMBULATORY_CARE_PROVIDER_SITE_OTHER): Payer: 59 | Admitting: Cardiovascular Disease

## 2016-03-03 ENCOUNTER — Encounter: Payer: Self-pay | Admitting: Cardiovascular Disease

## 2016-03-03 VITALS — BP 130/84 | HR 81 | Ht 62.0 in | Wt 165.0 lb

## 2016-03-03 DIAGNOSIS — E785 Hyperlipidemia, unspecified: Secondary | ICD-10-CM

## 2016-03-03 DIAGNOSIS — I10 Essential (primary) hypertension: Secondary | ICD-10-CM | POA: Diagnosis not present

## 2016-03-03 NOTE — Patient Instructions (Addendum)
Medication Instructions:  Your physician recommends that you continue on your current medications as directed. Please refer to the Current Medication list given to you today.   Labwork: NONE WILL HAVE LIPID  AND LIVER  AT  [facility] OFFICE   Testing/Procedures: CA  SCORE   WEEK OF  July  4 TH OUT OF POCKET   OF  $150.00 Follow-Up: Your physician wants you to follow-up in: Samantha Stark will receive a reminder letter in the mail two months in advance. If you don't receive a letter, please call our office to schedule the follow-up appointment. You have been referred to LIPID  CLINIC   [facility]  July  2017   Any Other Special Instructions Will Be Listed Below (If Applicable).     If you need a refill on your cardiac medications before your next appointment, please call your pharmacy.

## 2016-03-04 ENCOUNTER — Ambulatory Visit (AMBULATORY_SURGERY_CENTER): Payer: Self-pay | Admitting: *Deleted

## 2016-03-04 VITALS — Wt 166.2 lb

## 2016-03-04 DIAGNOSIS — Z1211 Encounter for screening for malignant neoplasm of colon: Secondary | ICD-10-CM

## 2016-03-04 MED ORDER — NA SULFATE-K SULFATE-MG SULF 17.5-3.13-1.6 GM/177ML PO SOLN: ORAL | Status: DC

## 2016-03-04 NOTE — Progress Notes (Signed)
No allergies to eggs or soy. No problems with anesthesia.  Pt given Emmi instructions for colonoscopy  No oxygen use  No diet drug use  

## 2016-03-10 ENCOUNTER — Encounter: Payer: Self-pay | Admitting: Gastroenterology

## 2016-03-20 ENCOUNTER — Encounter: Payer: Self-pay | Admitting: Gastroenterology

## 2016-03-20 ENCOUNTER — Ambulatory Visit (AMBULATORY_SURGERY_CENTER): Payer: 59 | Admitting: Gastroenterology

## 2016-03-20 VITALS — BP 136/81 | HR 73 | Temp 97.3°F | Resp 13 | Ht 62.0 in | Wt 166.0 lb

## 2016-03-20 DIAGNOSIS — Z1211 Encounter for screening for malignant neoplasm of colon: Secondary | ICD-10-CM | POA: Diagnosis present

## 2016-03-20 DIAGNOSIS — D122 Benign neoplasm of ascending colon: Secondary | ICD-10-CM

## 2016-03-20 DIAGNOSIS — K635 Polyp of colon: Secondary | ICD-10-CM

## 2016-03-20 DIAGNOSIS — D124 Benign neoplasm of descending colon: Secondary | ICD-10-CM

## 2016-03-20 MED ORDER — SODIUM CHLORIDE 0.9 % IV SOLN: 500.0000 mL | INTRAVENOUS | Status: DC

## 2016-03-20 NOTE — Progress Notes (Signed)
Called to room to assist during endoscopic procedure.  Patient ID and intended procedure confirmed with present staff. Received instructions for my participation in the procedure from the performing physician.  

## 2016-03-20 NOTE — Progress Notes (Signed)
Report to PACU, RN, vss, BBS= Clear.  

## 2016-03-20 NOTE — Patient Instructions (Signed)
YOU HAD AN ENDOSCOPIC PROCEDURE TODAY AT THE [facility]:   Refer to the procedure report that was given to you for any specific questions about what was found during the examination.  If the procedure report does not answer your questions, please call your gastroenterologist to clarify.  If you requested that your care partner not be given the details of your procedure findings, then the procedure report has been included in a sealed envelope for you to review at your convenience later.  YOU SHOULD EXPECT: Some feelings of bloating in the abdomen. Passage of more gas than usual.  Walking can help get rid of the air that was put into your GI tract during the procedure and reduce the bloating. If you had a lower endoscopy (such as a colonoscopy or flexible sigmoidoscopy) you may notice spotting of blood in your stool or on the toilet paper. If you underwent a bowel prep for your procedure, you may not have a normal bowel movement for a few days.  Please Note:  You might notice some irritation and congestion in your nose or some drainage.  This is from the oxygen used during your procedure.  There is no need for concern and it should clear up in a day or so.  SYMPTOMS TO REPORT IMMEDIATELY:   Following lower endoscopy (colonoscopy or flexible sigmoidoscopy):  Excessive amounts of blood in the stool  Significant tenderness or worsening of abdominal pains  Swelling of the abdomen that is new, acute  Fever of 100F or higher    For urgent or emergent issues, a gastroenterologist can be reached at any hour by calling [phone].   DIET: Your first meal following the procedure should be a small meal and then it is ok to progress to your normal diet. Heavy or fried foods are harder to digest and may make you feel nauseous or bloated.  Likewise, meals heavy in dairy and vegetables can increase bloating.  Drink plenty of fluids but you should avoid alcoholic beverages for 24  hours.  ACTIVITY:  You should plan to take it easy for the rest of today and you should NOT DRIVE or use heavy machinery until tomorrow (because of the sedation medicines used during the test).    FOLLOW UP: Our staff will call the number listed on your records the next business day following your procedure to check on you and address any questions or concerns that you may have regarding the information given to you following your procedure. If we do not reach you, we will leave a message.  However, if you are feeling well and you are not experiencing any problems, there is no need to return our call.  We will assume that you have returned to your regular daily activities without incident.  If any biopsies were taken you will be contacted by phone or by letter within the next 1-3 weeks.  Please call us at [phone] if you have not heard about the biopsies in 3 weeks.    SIGNATURES/CONFIDENTIALITY: You and/or your care partner have signed paperwork which will be entered into your electronic medical record.  These signatures attest to the fact that that the information above on your After Visit Summary has been reviewed and is understood.  Full responsibility of the confidentiality of this discharge information lies with you and/or your care-partner.   Information on polyps,diverticulosis ,and high fiber diet given to you today 

## 2016-03-20 NOTE — Op Note (Signed)
[facility] Patient Name: Samantha Stark Procedure Date: 03/20/2016 1:20 PM MRN: [id number] Endoscopist: Milus Banister , MD Age: 60 Referring MD:  Date of Birth: 11/21/1955 Gender: Female Procedure:                Colonoscopy Indications:              Screening for colorectal malignant neoplasm (normal                            colonoscopy 2007) Medicines:                Monitored Anesthesia Care Procedure:                Pre-Anesthesia Assessment:                           - Prior to the procedure, a History and Physical                            was performed, and patient medications and                            allergies were reviewed. The patient's tolerance of                            previous anesthesia was also reviewed. The risks                            and benefits of the procedure and the sedation                            options and risks were discussed with the patient.                            All questions were answered, and informed consent                            was obtained. Prior Anticoagulants: The patient has                            taken no previous anticoagulant or antiplatelet                            agents. ASA Grade Assessment: II - A patient with                            mild systemic disease. After reviewing the risks                            and benefits, the patient was deemed in                            satisfactory condition to undergo the procedure.  After obtaining informed consent, the colonoscope                            was passed under direct vision. Throughout the                            procedure, the patient's blood pressure, pulse, and                            oxygen saturations were monitored continuously. The                            Model CF-HQ190L [id number]) scope was introduced                            through the anus and advanced to the the cecum,                  identified by appendiceal orifice and ileocecal                            valve. The colonoscopy was performed without                            difficulty. The patient tolerated the procedure                            well. The quality of the bowel preparation was                            excellent. The ileocecal valve, appendiceal                            orifice, and rectum were photographed. Scope In: 1:24:03 PM Scope Out: 1:41:41 PM Scope Withdrawal Time: 0 hours 11 minutes 6 seconds  Total Procedure Duration: 0 hours 17 minutes 38 seconds  Findings:                 Five sessile polyps were found in the descending                            colon and ascending colon. The polyps were 5 to 7                            mm in size. These polyps were removed with a cold                            snare. Resection and retrieval were complete.                           Many small and large-mouthed diverticula were found                            in the left colon.  The exam was otherwise without abnormality on                            direct and retroflexion views. Complications:            No immediate complications. Estimated blood loss:                            None. Estimated Blood Loss:     Estimated blood loss: none. Impression:               - Five 5 to 7 mm polyps in the descending colon and                            in the ascending colon, removed with a cold snare.                            Resected and retrieved.                           - Diverticulosis in the left colon.                           - The examination was otherwise normal on direct                            and retroflexion views. Recommendation:           - Patient has a contact number available for                            emergencies. The signs and symptoms of potential                            delayed complications were discussed with the                             patient. Return to normal activities tomorrow.                            Written discharge instructions were provided to the                            patient.                           - Resume previous diet.                           - Continue present medications.                           You will receive a letter within 2-3 weeks with the                            pathology results and my final recommendations.  If the polyp(s) is proven to be 'pre-cancerous' on                            pathology, you will need repeat colonoscopy in 3-5                            years. If the polyp(s) is NOT 'precancerous' on                            pathology then you should repeat colon cancer                            screening in 10 years with colonoscopy without need                            for colon cancer screening by any method prior to                            then (including stool testing). Milus Banister, MD 03/20/2016 1:45:09 PM This report has been signed electronically.

## 2016-03-23 ENCOUNTER — Telehealth: Payer: Self-pay | Admitting: *Deleted

## 2016-03-23 NOTE — Telephone Encounter (Signed)
  Follow up Call-  Call back number 03/20/2016  Post procedure Call Back phone  # [phone]  Permission to leave phone message Yes     Patient questions:  Do you have a fever, pain , or abdominal swelling? No. Pain Score  0 *  Have you tolerated food without any problems? Yes.    Have you been able to return to your normal activities? Yes.    Do you have any questions about your discharge instructions: Diet   No. Medications  No. Follow up visit  No.  Do you have questions or concerns about your Care? No.  Actions: * If pain score is 4 or above: No action needed, pain <4.

## 2016-03-31 ENCOUNTER — Encounter: Payer: Self-pay | Admitting: Gastroenterology

## 2016-04-13 ENCOUNTER — Ambulatory Visit (INDEPENDENT_AMBULATORY_CARE_PROVIDER_SITE_OTHER)
Admission: RE | Admit: 2016-04-13 | Discharge: 2016-04-13 | Disposition: A | Discharge status: Home / Self Care | Payer: Self-pay | Source: Ambulatory Visit | Attending: Cardiovascular Disease | Admitting: Cardiovascular Disease

## 2016-04-13 ENCOUNTER — Other Ambulatory Visit (INDEPENDENT_AMBULATORY_CARE_PROVIDER_SITE_OTHER): Payer: 59 | Admitting: *Deleted

## 2016-04-13 DIAGNOSIS — E785 Hyperlipidemia, unspecified: Secondary | ICD-10-CM

## 2016-04-13 DIAGNOSIS — E782 Mixed hyperlipidemia: Secondary | ICD-10-CM

## 2016-04-13 LAB — HEPATIC FUNCTION PANEL
ALBUMIN: 4.1 g/dL (ref 3.6–5.1)
ALK PHOS: 55 U/L (ref 33–130)
ALT: 37 U/L — AB (ref 6–29)
AST: 83 U/L — AB (ref 10–35)
BILIRUBIN INDIRECT: 0.4 mg/dL (ref 0.2–1.2)
Bilirubin, Direct: 0.1 mg/dL (ref <=0.2)
TOTAL PROTEIN: 6.3 g/dL (ref 6.1–8.1)
Total Bilirubin: 0.5 mg/dL (ref 0.2–1.2)

## 2016-04-13 LAB — LIPID PANEL
Cholesterol: 276 mg/dL — ABNORMAL HIGH (ref 125–200)
HDL: 68 mg/dL (ref >=46)
LDL CALC: 184 mg/dL — AB (ref <130)
Total CHOL/HDL Ratio: 4.1 Ratio (ref <=5.0)
Triglycerides: 119 mg/dL (ref <150)
VLDL: 24 mg/dL (ref <30)

## 2016-04-13 NOTE — Addendum Note (Signed)
Addended by: Eulis Foster on: 04/13/2016 12:45 PM   Modules accepted: Orders

## 2016-04-15 ENCOUNTER — Telehealth: Payer: Self-pay | Admitting: Cardiovascular Disease

## 2016-04-15 DIAGNOSIS — Z79899 Other long term (current) drug therapy: Secondary | ICD-10-CM

## 2016-04-15 LAB — HM MAMMOGRAPHY

## 2016-04-15 NOTE — Telephone Encounter (Signed)
Patient is taking Red Yeast Rice, Niacin, and CoQ10. Patient had an appointment this Friday with Lipid Clinic. [facility] Pharmacist, she instructed patient to stop taking Red Yeast Rice and Niacin, repeat labs in 2 to 4 weeks, and reschedule with lipid clinic after repeat labs are done.  Instructed patient to stop Red Yeast Rice and Niacin. Patient will repeat labs on 05/01/16 and will see Lipid Clinic on 05/08/16. Routed lab results to patient's PCP and GI doctor.  Patient verbalized understanding.  Notes Recorded by Josue Hector, MD on 04/14/2016 at 10:12 AM Cholesterol bad but LFTls up ? Has she been taking any statin If so stop and f/u with lipid clinic ? PSK9 If not taking statin f/u with primary / GI for elevated LfTls

## 2016-04-15 NOTE — Telephone Encounter (Signed)
New message ° ° ° ° °Returning a call to the nurse °

## 2016-04-17 ENCOUNTER — Ambulatory Visit: Payer: 59

## 2016-04-17 LAB — HM PAP SMEAR: HM PAP: NEGATIVE

## 2016-05-01 ENCOUNTER — Other Ambulatory Visit: Payer: 59 | Admitting: *Deleted

## 2016-05-01 DIAGNOSIS — Z79899 Other long term (current) drug therapy: Secondary | ICD-10-CM

## 2016-05-01 LAB — HEPATIC FUNCTION PANEL
ALBUMIN: 3.7 g/dL (ref 3.6–5.1)
ALT: 17 U/L (ref 6–29)
AST: 23 U/L (ref 10–35)
Alkaline Phosphatase: 61 U/L (ref 33–130)
BILIRUBIN DIRECT: 0.1 mg/dL (ref <=0.2)
BILIRUBIN TOTAL: 0.6 mg/dL (ref 0.2–1.2)
Indirect Bilirubin: 0.5 mg/dL (ref 0.2–1.2)
Total Protein: 6.2 g/dL (ref 6.1–8.1)

## 2016-05-01 LAB — LIPID PANEL
CHOL/HDL RATIO: 4.4 ratio (ref <=5.0)
CHOLESTEROL: 226 mg/dL — AB (ref 125–200)
HDL: 51 mg/dL (ref >=46)
LDL Cholesterol: 164 mg/dL — ABNORMAL HIGH (ref <130)
Triglycerides: 57 mg/dL (ref <150)
VLDL: 11 mg/dL (ref <30)

## 2016-05-08 ENCOUNTER — Ambulatory Visit (INDEPENDENT_AMBULATORY_CARE_PROVIDER_SITE_OTHER): Payer: 59 | Admitting: Pharmacist

## 2016-05-08 DIAGNOSIS — E782 Mixed hyperlipidemia: Secondary | ICD-10-CM

## 2016-05-08 NOTE — Progress Notes (Signed)
Patient ID: Samantha Stark                 DOB: [birth year]                    MRN: [id number]     HPI: Samantha Stark is a 60 y.o. female patient referred to lipid clinic by Dr. [name] for a history of myalgias on statins and elevated LFTs on RYR and Niacin.  Pt was seen by Dr. [name] in May 2017.  She has a PMH significant for palpitations and an elevated calcium score (calcium score 84; 85th percentile for age and sex).  She has experienced myalgias in her legs with Lipitor, pravastatin and Vytorin.  She was most recently taking OTC red yeast rice and Niacin.  This resulted in LFTs.  She stopped these therapies at the beginning of July and had repeat bloodwork done on 7/21 that showed normal LFTs.  She is here today to discuss other options for LDL reduction.   Current Medications: none Intolerances: Lipitor (myalgias), Vytorin (myalgias), pravastatin (myalgias), Red Yeast Rice and Niacin (elevated LFTs) Risk Factors: elevated calcium score, HTN (10 year risk- 5.3%) LDL goal: <100 mg/dL  Diet: Pt has several food intolerances so is gluten free and eats very limited dairy.  Breakfast- 2 eggs and oatmeal, Snack- berries or apples, Lunch- salad with grilled chicken.  She does not eat bread, rice or potatoes.  She does a lot of legumes and quinoa.  Does not drink soft drinks.  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  Family History: Maternal uncle x 2- MI after the age of 27.  Brother diagnosed with CAD but no event at 4   Social History: Never smoked.  2-3 glasses of wine per week.   Labs: 05/01/16- TC 226, TG 57, HDL 51, LDL 164, AST 23, ALT 17 (no therapy) 04/13/16- TC 276, TG 119, HDL 68, LDL 184, AST 83, ALT 37 (Red Yeast Rice and Niacin) 05/15/15- TC 259, TG 119, HDL 52, LDL 183, LDL-P 1630 (no therapy)  Past Medical History:  Diagnosis Date  . Allergy   . Anxiety    Situational  . Chest pain 2006 & 2008   Negative nuclear stress study, Dr. [name]  .  Diverticulosis   . Hyperlipidemia   . Hypertension   . MVP (mitral valve prolapse)   . Seasonal allergies     Current Outpatient Prescriptions on File Prior to Visit  Medication Sig Dispense Refill  . aspirin 325 MG tablet Take 325 mg by mouth daily.    . cyclobenzaprine (FLEXERIL) 5 MG tablet Take 5-10 mg by mouth 3 (three) times daily as needed for muscle spasms. Reported on 03/04/2016    . EPINEPHrine 0.3 mg/0.3 mL IJ SOAJ injection Inject 0.3 mg into the muscle as directed. Reported on 03/04/2016    . escitalopram (LEXAPRO) 10 MG tablet Take 1 tablet (10 mg total) by mouth daily. 90 tablet 2  . lisinopril (PRINIVIL,ZESTRIL) 10 MG tablet Take 1 tablet (10 mg total) by mouth daily. 90 tablet 2  . metoprolol succinate (TOPROL-XL) 25 MG 24 hr tablet Take 1 tablet (25 mg total) by mouth as needed. (Patient not taking: Reported on 03/04/2016) 30 tablet 6  . Multiple Vitamin (MULTIVITAMIN) tablet Take 1 tablet by mouth daily.    . Nutritional Supplements (JUICE PLUS FIBRE PO) Take by mouth daily.    . propranolol (INDERAL) 10 MG tablet Take one tablet by mouth  as needed for palpitations up to 3 per day and at least 6 hours apart. (Patient not taking: Reported on 03/04/2016) 90 tablet 1  . spironolactone (ALDACTONE) 25 MG tablet TAKE 1 TABLET BY MOUTH ONCE DAILY 90 tablet 0   No current facility-administered medications on file prior to visit.     Allergies  Allergen Reactions  . Shrimp [Shellfish Allergy]     Angioedema caused by shrimp as well as oysters. EpiPen current  . Amlodipine Besylate     REACTION: edema, fatigue  . Calcium Channel Blockers     REACTION: edema , fatigue  . Cephalexin     REACTION: diarrhea  . Codeine     REACTION: nausea  . Coricidin D Cold-Flu-Sinus [Chlorphen-Pe-Acetaminophen] Hives  . Hydrochlorothiazide W-Triamterene     REACTION: low potassium  . Statins     Leg cramps with Lipitor & Pravachol    Assessment/Plan:  1.  Hyperlipidemia- Pt's LDL  elevated.  Given her risk factors and positive calcium score, would ideally like LDL below 100.  Discussed role of statins versus other therapy in primary prevention.  Pt is very hesitant to start statin given her past intolerances.  We discussed ways to reduce the risk of reoccurrence, specifically low dose Crestor.  She would like to try Konsyl, a high dose psyllium product.  Suggested we check a CardioIQ to evaluate ApoB and Lp(a) to ensure they are not elevated before agreeing to not retry statin.  Will also check CRP as patient is interested in inflammatory piece related to coronary disease.  Plan to check labs next week and follow up with patient once available.  If any of these markers are elevated, highly suggest retrying statin rather than OTC supplement.

## 2016-05-15 ENCOUNTER — Other Ambulatory Visit: Payer: 59 | Admitting: *Deleted

## 2016-05-15 DIAGNOSIS — E782 Mixed hyperlipidemia: Secondary | ICD-10-CM

## 2016-05-15 LAB — C-REACTIVE PROTEIN: CRP: <0.5 mg/dL (ref <0.60)

## 2016-05-19 LAB — CARDIO IQ(R) ADVANCED LIPID PANEL
Apolipoprotein B: 122 mg/dL — ABNORMAL HIGH (ref 49–103)
CHOLESTEROL, TOTAL (CARDIO IQ ADV LIPID PANEL): 285 mg/dL — AB (ref 125–200)
Cholesterol/HDL Ratio: 5.2 calc — ABNORMAL HIGH (ref <=5.0)
HDL CHOLESTEROL (CARDIO IQ ADV LIPID PANEL): 55 mg/dL (ref >=46)
LDL CHOLESTEROL CALCULATED (CARDIO IQ ADV LIPID PANEL): 210 mg/dL — AB
LDL LARGE: 6373 nmol/L (ref 5038–17886)
LDL MEDIUM: 339 nmol/L (ref 121–397)
LDL PARTICLE NUMBER: 1809 nmol/L (ref 1016–2185)
LDL PEAK SIZE: 223.6 Angstrom (ref >=218.2)
LDL Small: 144 nmol/L (ref 115–386)
Lipoprotein (a): 81 nmol/L — ABNORMAL HIGH (ref <75)
Non-HDL Cholesterol: 230 mg/dL
Triglycerides: 102 mg/dL

## 2016-05-21 ENCOUNTER — Telehealth: Payer: Self-pay | Admitting: Cardiovascular Disease

## 2016-05-21 NOTE — Telephone Encounter (Signed)
New message       Returning a call to the nurse---pt states she saw her labs on mychart and you do not have to call her back unless there is something else you need to tell her

## 2016-05-21 NOTE — Telephone Encounter (Signed)
Notes Recorded by Josue Hector, MD on 05/20/2016 at 8:03 AM EDT Chol is up would do coronary calcium score and if over 50th percentile consider trial of crestor 5 mg she has been intolerant to other statins in past f/u in lipid clinic to discuss   Patient's coronary calcium score is 84 and 85th percentile.  Plan of care from visit with Lipid Clinic on 05/08/16 Assessment/Plan: " Hyperlipidemia- Pt's LDL elevated.  Given her risk factors and positive calcium score, would ideally like LDL below 100.  Discussed role of statins versus other therapy in primary prevention.  Pt is very hesitant to start statin given her past intolerances.  We discussed ways to reduce the risk of reoccurrence, specifically low dose Crestor.  She would like to try Konsyl, a high dose psyllium product.  Suggested we check a CardioIQ to evaluate ApoB and Lp(a) to ensure they are not elevated before agreeing to not retry statin.  Will also check CRP as patient is interested in inflammatory piece related to coronary disease.  Plan to check labs next week and follow up with patient once available.  If any of these markers are elevated, highly suggest retrying statin rather than OTC supplement."  Will forward to Elberta Leatherwood and Fuller [place] the pharmacist to follow up on plan.

## 2016-05-22 ENCOUNTER — Other Ambulatory Visit: Payer: 59

## 2016-05-22 ENCOUNTER — Encounter: Payer: 59 | Admitting: Primary Care

## 2016-05-27 ENCOUNTER — Encounter: Payer: 59 | Admitting: Primary Care

## 2016-05-29 ENCOUNTER — Other Ambulatory Visit: Payer: 59

## 2016-06-05 ENCOUNTER — Encounter: Payer: 59 | Admitting: Primary Care

## 2016-06-05 ENCOUNTER — Encounter: Payer: Self-pay | Admitting: Family Medicine

## 2016-06-05 ENCOUNTER — Ambulatory Visit (INDEPENDENT_AMBULATORY_CARE_PROVIDER_SITE_OTHER): Payer: 59 | Admitting: Family Medicine

## 2016-06-05 VITALS — BP 121/83 | HR 74 | Ht 62.0 in | Wt 160.2 lb

## 2016-06-05 DIAGNOSIS — F4323 Adjustment disorder with mixed anxiety and depressed mood: Secondary | ICD-10-CM | POA: Diagnosis not present

## 2016-06-05 DIAGNOSIS — R002 Palpitations: Secondary | ICD-10-CM | POA: Diagnosis not present

## 2016-06-05 DIAGNOSIS — E663 Overweight: Secondary | ICD-10-CM

## 2016-06-05 DIAGNOSIS — E782 Mixed hyperlipidemia: Secondary | ICD-10-CM | POA: Diagnosis not present

## 2016-06-05 DIAGNOSIS — F411 Generalized anxiety disorder: Secondary | ICD-10-CM

## 2016-06-05 DIAGNOSIS — T7802XD 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 (crustaceans), subsequent encounter: Secondary | ICD-10-CM

## 2016-06-05 DIAGNOSIS — I10 Essential (primary) hypertension: Secondary | ICD-10-CM

## 2016-06-05 DIAGNOSIS — E559 Vitamin D deficiency, unspecified: Secondary | ICD-10-CM

## 2016-06-05 DIAGNOSIS — R609 Edema, unspecified: Secondary | ICD-10-CM | POA: Insufficient documentation

## 2016-06-05 DIAGNOSIS — R600 Localized edema: Secondary | ICD-10-CM

## 2016-06-05 DIAGNOSIS — Z6828 Body mass index (BMI) 28.0-28.9, adult: Secondary | ICD-10-CM | POA: Insufficient documentation

## 2016-06-05 HISTORY — DX: Overweight: E66.3

## 2016-06-05 NOTE — Assessment & Plan Note (Addendum)
Samantha Stark- sees her for heart palps---> and MVP.  Only takes metoprolol and propranolol as needed for heart palpitations - been over a yr since taking them.   Symptoms stable complaints

## 2016-06-05 NOTE — Patient Instructions (Addendum)
Goal blood pressure for 60+ is 150/90 or less on a regular basis. You can read at website and heart association-www.heart.org.   59min mod intensity exercise daily -- 150min /wk.   One half of your weight in ounces of water per day if you are sitting in an air-conditioned room and do not exercise or exert yourself or your not out and he.  Check your blood pressure at home especially the evening once and keep a log. Bring in next office visit for my review.        Guidelines for a Low Cholesterol, Low Saturated Fat Diet Avoid sat and trans fats.  Fats - Limit total intake of fats and oils. - Avoid butter, stick margarine, shortening, lard, palm and coconut oils. - Limit mayonnaise, salad dressings, gravies and sauces, unless they are homemade with low-fat ingredients. - Limit chocolate. - Choose low-fat and nonfat products, such as low-fat mayonnaise, low-fat or non-hydrogenated peanut butter, low-fat or fat-free salad dressings and nonfat gravy. - Use vegetable oil, such as canola or olive oil. - Look for margarine that does not contain trans fatty acids. - Use nuts in moderate amounts. - Read ingredient labels carefully to determine both amount and type of fat present in foods. Limit saturated and trans fats! - Avoid high-fat processed and convenience foods.  Meats and Meat Alternatives - Choose fish, chicken, [place] and lean meats. - Use dried beans, peas, lentils and tofu. - Limit egg yolks to three to four per week. - If you eat red meat, limit to no more than three servings per week and choose loin or round cuts. - Avoid fatty meats, such as bacon, sausage, franks, luncheon meats and ribs. - Avoid all organ meats, including liver.  Dairy - Choose nonfat or low-fat milk, yogurt and cottage cheese. - Most cheeses are high in fat. Choose cheeses made from non-fat milk, such as mozzarella and ricotta cheese. - Choose light or fat-free cream cheese and sour cream. - Avoid  cream and sauces made with cream.  Fruits and Vegetables - Eat a wide variety of fruits and vegetables. - Use lemon juice, vinegar or "mist" olive oil on vegetables. - Avoid adding sauces, fat or oil to vegetables.  Breads, Cereals and Grains - Choose whole-grain breads, cereals, pastas and rice. - Avoid high-fat snack foods, such as granola, cookies, pies, pastries, doughnuts and croissants.  Cooking Tips - Avoid deep fried foods. - Trim visible fat off meats and remove skin from poultry before cooking. - Bake, broil, boil, poach or roast poultry, fish and lean meats. - Drain and discard fat that drains out of meat as you cook it. - Add little or no fat to foods. - Use vegetable oil sprays to grease pans for cooking or baking. - Steam vegetables. - Use herbs or no-oil marinades to flavor foods.

## 2016-06-05 NOTE — Progress Notes (Signed)
New patient office visit note:  Impression and Recommendations:    1. Essential hypertension   2. HYPERLIPIDEMIA   3. Palpitations   4. Adjustment disorder    5. Peripheral edema   6. Vitamin D deficiency   7. Hypercalcemia   8. Generalized anxiety disorder   9. 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  Only takes metoprolol and propranolol as needed for heart palpitations - been over a yr since taking them.   Symptoms stable complaints  Essential hypertension Could not tolerate HCTZ- due to low K;    -Lisinopril: Been on it for approximately 10 yrs now. -Uses spironolactone once weekly on average for lower extremity edema. -Uses Toprol XL and propranolol very sparingly for palpitations, never together.  HYPERLIPIDEMIA Leg cramps with statins in past. - Dr Samantha Stark- Cards is following.   - never tried crestor  Patient prefers to do it "naturally "to control her hyperlipidemia, diet and exercise counseling done. Handouts provided.  Peripheral edema Occasionally takes spironolactone as needed for peripheral edema in her legs- many yrs now- since at least 2012  Adjustment disorder  Lexapro daily. When on it during very stressful time in her life a couple of years back and has been on it ever since. She denies worsening of her anx and depression.  Mood-stable   Vitamin D deficiency We'll obtain levels in near future  Generalized anxiety disorder Symptoms well-controlled. Lexapro works well without side effect.  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 These represent intolerances.    She has had angioedema with shellfish, shrimp and oysters.  Has EpiPen on her at all times as  needed  Osteopenia   BMD @ [facility]  Diagnosis:  Osteopenia T score -1.1 @ hip ; hip fracture risk 0.4 %; major fracture risk 6.5% Rx: none No PMH fractures ;no  PMH GERD  FH Osteoporosis: MGM  Goal blood pressure for 60+ is 150/90 or less on a regular basis. You can read at website and heart association-www.heart.org.   79min mod intensity exercise daily -- 140min /wk.   One half of your weight in ounces of water per day if you are sitting in an air-conditioned room and do not exercise or exert yourself or your not out and he.  Check your blood pressure at home especially the evening once and keep a log. Bring in next office visit for my review.   Orders Placed This Encounter  Procedures  . RPR  . Vitamin B12  . Sedimentation rate  . CBC  . Folate  . COMPLETE METABOLIC PANEL WITH GFR  . Hemoglobin A1c  . Hepatitis C antibody  . Lipid panel  . HIV antibody  . Phosphorus  . TSH  . VITAMIN D 25 Hydroxy (Vit-D Deficiency, Fractures)    Patient's Medications  New Prescriptions   No medications on file  Previous Medications   ASPIRIN 325 MG TABLET    Take 325 mg by mouth daily.   CYCLOBENZAPRINE (FLEXERIL) 5 MG TABLET    Take 5-10 mg by mouth 3 (three) times daily as needed for muscle spasms. Reported on 03/04/2016   EPINEPHRINE 0.3 MG/0.3 ML IJ SOAJ INJECTION    Inject 0.3 mg into the muscle as directed. Reported on  03/04/2016   ESCITALOPRAM (LEXAPRO) 10 MG TABLET    Take 1 tablet (10 mg total) by mouth daily.   LISINOPRIL (PRINIVIL,ZESTRIL) 10 MG TABLET    Take 1 tablet (10 mg total) by mouth daily.   METOPROLOL SUCCINATE (TOPROL-XL) 25 MG 24 HR TABLET    Take 1 tablet (25 mg total) by mouth as needed.   MULTIPLE VITAMIN (MULTIVITAMIN) TABLET    Take 1 tablet by mouth daily.   NUTRITIONAL SUPPLEMENTS (JUICE PLUS FIBRE PO)    Take by mouth daily.   PROPRANOLOL (INDERAL) 10 MG TABLET    Take one tablet by mouth as needed for palpitations up to 3 per day and at  least 6 hours apart.   SPIRONOLACTONE (ALDACTONE) 25 MG TABLET    TAKE 1 TABLET BY MOUTH ONCE DAILY  Modified Medications   No medications on file  Discontinued Medications   No medications on file    Return for Fasting blood work- near future; then OV with me to discuss.  The patient was counseled, risk factors were discussed, anticipatory guidance given.  Gross side effects, risk and benefits, and alternatives of medications discussed with patient.  Patient is aware that all medications have potential side effects and we are unable to predict every side effect or drug-drug interaction that may occur.  Expresses verbal understanding and consents to current therapy plan and treatment regimen.  Please see AVS handed out to patient at the end of our visit for further patient instructions/ counseling done pertaining to today's office visit.    Note: This document was prepared using Dragon voice recognition software and may include unintentional dictation errors.  ----------------------------------------------------------------------------------------------------------------------    Subjective:    Chief Complaint  Patient presents with  . Establish Care    HPI: Samantha Stark is a pleasant 60 y.o. female who presents to [place] [facility] today to review their medical history with me and establish care.    Dr Samantha Stark retired from [employer] and pt needed new doc.   Samantha Stark, in [place]. Prefers natural ways to treat dx.    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 - 2 kids and 6 grandchildren- Two here and 4 in [place] .   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   Loves to read, loves all sports.  - worst shape she has been in a while- used to exercise daily but now she has not in sometime.   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   Sees Dr. Johnsie Stark of cardiology for benign heart palpitations- 5 years ago she had a negative workup including normal Myoview study,   Also had on 04/13/16: Cardiac CT for Calcium Scoring- IMPRESSION: No significant incidental noncardiac findings are noted.  - Hyperlipidemia: Has had leg cramps and pain with Lipitor and Pravachol, has not tried Crestor.  Patient follows in the lipid clinic at her cardiologist's office.  HTN: stable.  Patient was not aware that 60 year old your has higher blood pressures to maintain perfusion to distal areas of the brain and body.  Adjustment disorder- some depression but mostly anxiety:  Started Lexapro couple years ago. Mood is now stable. She had work stress and hated to travel a lot which caused some anxiety as well as familial stress.    Wt Readings from  Last 3 Encounters:  06/05/16 160 lb 3.2 oz (72.7 kg)  03/20/16 166 lb (75.3 kg)  03/04/16 166 lb 3.2 oz (75.4 kg)   BP Readings from Last 3 Encounters:  06/05/16 121/83  03/20/16 136/81  03/03/16 130/84   Pulse Readings from Last 3 Encounters:  06/05/16 74  03/20/16 73  03/03/16 81     Patient Active Problem List   Diagnosis Date Noted  . Adjustment disorder  06/05/2016  . Peripheral edema 06/05/2016  . Acute cystitis 01/28/2016  . Non-toxic multinodular goiter 07/05/2013  . Pleural effusion 06/10/2013  . Benign heart Palpitations 06/29/2012  . Osteopenia 03/15/2012  . 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 Vitamin D deficiency 05/06/2010  . h/o HYPERCALCEMIA 05/06/2010  . Diverticulosis of large intestine 07/18/2008  . Generalized anxiety disorder 07/18/2008  . Essential hypertension 01/16/2008  . HYPERLIPIDEMIA 10/04/2007     Past Medical History:  Diagnosis Date  . Allergy   . Anxiety    Situational  . Chest pain 2006 & 2008   Negative nuclear stress study, Dr. Johnsie Stark  . Diverticulosis   . Hyperlipidemia   .  Hypertension   . MVP (mitral valve prolapse)   . Seasonal allergies      Past Surgical History:  Procedure Laterality Date  . ABDOMINAL HYSTERECTOMY    . COLONOSCOPY  2008 & 2011   [facility], negative except for Tics  . KNEE ARTHROSCOPY Bilateral 2002, 2007  . PARTIAL HYSTERECTOMY  1993  . ROTATOR CUFF REPAIR Right 2010  . SPINE SURGERY  1992   Lumbar laminectomy L5-S1  . TONSILLECTOMY  1976  . TUBAL LIGATION       Family History  Problem Relation Age of Onset  . Heart attack Maternal Uncle     MI in 60s  . Colon cancer Maternal Uncle 14  . Osteoporosis Maternal Grandmother   . Diabetes Maternal Grandmother   . Colon polyps Mother   . Hyperlipidemia Mother   . Goiter Mother     also Rosalee Kaufman  . Asthma Mother   . Colon polyps Brother   . Hyperlipidemia Brother     X2  . Colon polyps Maternal Aunt   . Diabetes Maternal Aunt     X2  . Alcohol abuse Father   . Hyperlipidemia Sister   . Stroke Maternal Grandfather     in 53s  . Alzheimer's disease Maternal Aunt   . Alcohol abuse Paternal Aunt   . Hyperlipidemia Paternal Aunt   . Alcohol abuse Paternal Uncle   . Diabetes Paternal Grandmother   . Hyperlipidemia Brother      History  Drug Use No    History  Alcohol Use  . 1.8 oz/week  . 3 Glasses of wine per week    History  Smoking Status  . Never Smoker  Smokeless Tobacco  . Never Used    Patient's Medications  New Prescriptions   No medications on file  Previous Medications   ASPIRIN 325 MG TABLET    Take 325 mg by mouth daily.   CYCLOBENZAPRINE (FLEXERIL) 5 MG TABLET    Take 5-10 mg by mouth 3 (three) times daily as needed for muscle spasms. Reported on 03/04/2016   EPINEPHRINE 0.3 MG/0.3 ML IJ SOAJ INJECTION    Inject 0.3 mg into the muscle as directed. Reported on 03/04/2016   ESCITALOPRAM (LEXAPRO) 10 MG TABLET    Take 1 tablet (10 mg total) by mouth daily.   LISINOPRIL (PRINIVIL,ZESTRIL) 10  MG TABLET    Take 1 tablet (10 mg total) by  mouth daily.   METOPROLOL SUCCINATE (TOPROL-XL) 25 MG 24 HR TABLET    Take 1 tablet (25 mg total) by mouth as needed.   MULTIPLE VITAMIN (MULTIVITAMIN) TABLET    Take 1 tablet by mouth daily.   NUTRITIONAL SUPPLEMENTS (JUICE PLUS FIBRE PO)    Take by mouth daily.   PROPRANOLOL (INDERAL) 10 MG TABLET    Take one tablet by mouth as needed for palpitations up to 3 per day and at least 6 hours apart.   SPIRONOLACTONE (ALDACTONE) 25 MG TABLET    TAKE 1 TABLET BY MOUTH ONCE DAILY  Modified Medications   No medications on file  Discontinued Medications   No medications on file    Allergies: Shrimp [shellfish allergy]; Amlodipine besylate; Calcium channel blockers; Cephalexin; Codeine; Coricidin d cold-flu-sinus [chlorphen-pe-acetaminophen]; Erythromycin; Hydrochlorothiazide w-triamterene; and Statins  Review of Systems:   ( Completed via Adult Medical History Intake form today ) General:  Denies fever, chills, appetite changes, unexplained weight loss.  Optho/Auditory:   Denies visual changes, blurred vision/LOV, ringing in ears/ diff hearing Respiratory:   Denies SOB, DOE, cough, wheezing.  Cardiovascular:   Denies chest pain, palpitations, new onset peripheral edema  Gastrointestinal:   Denies nausea, vomiting, diarrhea.  Genitourinary:    Denies dysuria, increased frequency, flank pain.  Endocrine:     Denies hot or cold intolerance, polyuria, polydipsia. Musculoskeletal:  Denies unexplained myalgias, joint swelling, arthralgias, gait problems.  Skin:  Denies rash, suspicious lesions or new/ changes in moles Neurological:    Denies dizziness, syncope, unexplained weakness, lightheadedness, numbness  Psychiatric/Behavioral:   Denies mood changes, suicidal or homicidal ideations, hallucinations    Objective:   There were no vitals taken for this visit. Body mass index is 29.3 kg/m.   General: Well Developed, well nourished, and in no acute distress.  Neuro: Alert and oriented x3,  extra-ocular muscles intact, sensation grossly intact.  HEENT: Normocephalic, atraumatic, pupils equal round reactive to light, neck supple, no gross masses, no carotid bruits, no JVD apprec Skin: no gross suspicious lesions or rashes  Cardiac: Regular rate and rhythm, no murmurs rubs or gallops.  Respiratory: Essentially clear to auscultation bilaterally. Not using accessory muscles, speaking in full sentences.  Abdominal: Soft, not grossly distended Musculoskeletal: Ambulates w/o diff, FROM * 4 ext.  Vasc: less 2 sec cap RF, warm and pink  Psych:  No HI/SI, judgement and insight good.

## 2016-06-05 NOTE — Assessment & Plan Note (Addendum)
Could not tolerate HCTZ- due to low K;    -Lisinopril: Been on it for approximately 10 yrs now. -Uses spironolactone once weekly on average for lower extremity edema. -Uses Toprol XL and propranolol very sparingly for palpitations, never together.

## 2016-06-05 NOTE — Assessment & Plan Note (Addendum)
Occasionally takes spironolactone as needed for peripheral edema in her legs- many yrs now- since at least 2012

## 2016-06-05 NOTE — Assessment & Plan Note (Addendum)
None of the medications listed on her allergies have caused fever, rash, or angioedema. These represent intolerances.    She has had angioedema with shellfish, shrimp and oysters.  Has EpiPen on her at all times as needed

## 2016-06-05 NOTE — Assessment & Plan Note (Signed)
Symptoms well-controlled. Lexapro works well without side effect.  Encourage dietary lifestyle modifications to improve mood and decrease anxiety

## 2016-06-05 NOTE — Assessment & Plan Note (Signed)
   BMD @ [facility]  Diagnosis:  Osteopenia T score -1.1 @ hip ; hip fracture risk 0.4 %; major fracture risk 6.5% Rx: none No PMH fractures ;no  PMH GERD  FH Osteoporosis: MGM

## 2016-06-05 NOTE — Assessment & Plan Note (Signed)
Lexapro daily. When on it during very stressful time in her life a couple of years back and has been on it ever since. She denies worsening of her anx and depression.  Mood-stable

## 2016-06-05 NOTE — Assessment & Plan Note (Signed)
We'll obtain levels in near future

## 2016-06-05 NOTE — Assessment & Plan Note (Addendum)
Leg cramps with statins in past. - Dr [name]- Cards is following.   - never tried crestor  Patient prefers to do it "naturally "to control her hyperlipidemia, diet and exercise counseling done. Handouts provided.

## 2016-06-19 ENCOUNTER — Encounter: Payer: Self-pay | Admitting: Family Medicine

## 2016-06-19 DIAGNOSIS — F411 Generalized anxiety disorder: Secondary | ICD-10-CM

## 2016-06-19 NOTE — Telephone Encounter (Signed)
Attempted to reach patient on multiple attempts.  No answer or callback received.

## 2016-06-22 ENCOUNTER — Other Ambulatory Visit: Payer: 59

## 2016-06-23 ENCOUNTER — Other Ambulatory Visit (INDEPENDENT_AMBULATORY_CARE_PROVIDER_SITE_OTHER): Payer: 59

## 2016-06-23 DIAGNOSIS — R002 Palpitations: Secondary | ICD-10-CM | POA: Diagnosis not present

## 2016-06-23 DIAGNOSIS — E559 Vitamin D deficiency, unspecified: Secondary | ICD-10-CM

## 2016-06-23 DIAGNOSIS — F411 Generalized anxiety disorder: Secondary | ICD-10-CM

## 2016-06-23 DIAGNOSIS — E782 Mixed hyperlipidemia: Secondary | ICD-10-CM

## 2016-06-23 DIAGNOSIS — I10 Essential (primary) hypertension: Secondary | ICD-10-CM

## 2016-06-23 DIAGNOSIS — R609 Edema, unspecified: Secondary | ICD-10-CM

## 2016-06-23 DIAGNOSIS — F4323 Adjustment disorder with mixed anxiety and depressed mood: Secondary | ICD-10-CM

## 2016-06-23 MED ORDER — ESCITALOPRAM OXALATE 10 MG PO TABS: 10.0000 mg | ORAL_TABLET | Freq: Every day | ORAL | 0 refills | Status: DC

## 2016-06-24 LAB — TSH: TSH: 3.61 m[IU]/L

## 2016-06-24 LAB — COMPLETE METABOLIC PANEL WITH GFR
ALK PHOS: 57 U/L (ref 33–130)
ALT: 16 U/L (ref 6–29)
AST: 22 U/L (ref 10–35)
Albumin: 4.1 g/dL (ref 3.6–5.1)
BILIRUBIN TOTAL: 0.4 mg/dL (ref 0.2–1.2)
BUN: 12 mg/dL (ref 7–25)
CALCIUM: 10.3 mg/dL (ref 8.6–10.4)
CO2: 28 mmol/L (ref 20–31)
CREATININE: 0.65 mg/dL (ref 0.50–0.99)
Chloride: 104 mmol/L (ref 98–110)
Glucose, Bld: 82 mg/dL (ref 65–99)
Potassium: 4.2 mmol/L (ref 3.5–5.3)
Sodium: 140 mmol/L (ref 135–146)
TOTAL PROTEIN: 6.9 g/dL (ref 6.1–8.1)

## 2016-06-24 LAB — HEMOGLOBIN A1C
HEMOGLOBIN A1C: 5.2 % (ref <5.7)
MEAN PLASMA GLUCOSE: 103 mg/dL

## 2016-06-24 LAB — VITAMIN B12: VITAMIN B 12: 606 pg/mL (ref 200–1100)

## 2016-06-24 LAB — CBC
HCT: 40.9 % (ref 35.0–45.0)
HEMOGLOBIN: 13.8 g/dL (ref 11.7–15.5)
MCH: 30 pg (ref 27.0–33.0)
MCHC: 33.7 g/dL (ref 32.0–36.0)
MCV: 88.9 fL (ref 80.0–100.0)
MPV: 9.6 fL (ref 7.5–12.5)
PLATELETS: 228 10*3/uL (ref 140–400)
RBC: 4.6 MIL/uL (ref 3.80–5.10)
RDW: 13.2 % (ref 11.0–15.0)
WBC: 5.1 10*3/uL (ref 3.8–10.8)

## 2016-06-24 LAB — HIV ANTIBODY (ROUTINE TESTING W REFLEX): HIV: NONREACTIVE

## 2016-06-24 LAB — SEDIMENTATION RATE: SED RATE: 9 mm/h (ref 0–30)

## 2016-06-24 LAB — VITAMIN D 25 HYDROXY (VIT D DEFICIENCY, FRACTURES): VIT D 25 HYDROXY: 32 ng/mL (ref 30–100)

## 2016-06-24 LAB — FOLATE: FOLATE: 17.8 ng/mL (ref >5.4)

## 2016-06-24 LAB — LIPID PANEL
Cholesterol: 280 mg/dL — ABNORMAL HIGH (ref 125–200)
HDL: 53 mg/dL (ref >=46)
LDL CALC: 205 mg/dL — AB (ref <130)
Total CHOL/HDL Ratio: 5.3 Ratio — ABNORMAL HIGH (ref <=5.0)
Triglycerides: 110 mg/dL (ref <150)
VLDL: 22 mg/dL (ref <30)

## 2016-06-24 LAB — RPR

## 2016-06-24 LAB — PHOSPHORUS: PHOSPHORUS: 3.6 mg/dL (ref 2.5–4.5)

## 2016-06-24 LAB — HEPATITIS C ANTIBODY: HCV AB: NEGATIVE

## 2016-06-25 ENCOUNTER — Encounter: Payer: Self-pay | Admitting: Pharmacist

## 2016-06-26 ENCOUNTER — Ambulatory Visit (INDEPENDENT_AMBULATORY_CARE_PROVIDER_SITE_OTHER): Payer: 59 | Admitting: Family Medicine

## 2016-06-26 ENCOUNTER — Encounter: Payer: Self-pay | Admitting: Family Medicine

## 2016-06-26 VITALS — BP 131/80 | HR 69 | Ht 62.0 in | Wt 162.3 lb

## 2016-06-26 DIAGNOSIS — I10 Essential (primary) hypertension: Secondary | ICD-10-CM

## 2016-06-26 DIAGNOSIS — F411 Generalized anxiety disorder: Secondary | ICD-10-CM

## 2016-06-26 DIAGNOSIS — E559 Vitamin D deficiency, unspecified: Secondary | ICD-10-CM

## 2016-06-26 DIAGNOSIS — E782 Mixed hyperlipidemia: Secondary | ICD-10-CM | POA: Diagnosis not present

## 2016-06-26 DIAGNOSIS — J302 Other seasonal allergic rhinitis: Secondary | ICD-10-CM

## 2016-06-26 DIAGNOSIS — F4323 Adjustment disorder with mixed anxiety and depressed mood: Secondary | ICD-10-CM | POA: Diagnosis not present

## 2016-06-26 DIAGNOSIS — E663 Overweight: Secondary | ICD-10-CM

## 2016-06-26 DIAGNOSIS — Z7189 Other specified counseling: Secondary | ICD-10-CM

## 2016-06-26 MED ORDER — ESCITALOPRAM OXALATE 10 MG PO TABS: 10.0000 mg | ORAL_TABLET | Freq: Every day | ORAL | 1 refills | Status: DC

## 2016-06-26 MED ORDER — VITAMIN D3 125 MCG (5000 UT) PO TABS: ORAL_TABLET | ORAL | 90 refills | Status: DC

## 2016-06-26 NOTE — Patient Instructions (Addendum)
At next OV-  we will check magnesium

## 2016-06-26 NOTE — Progress Notes (Signed)
Assessment and plan:  1. HYPERLIPIDEMIA   2. Generalized anxiety disorder   3. Vitamin D deficiency   4. Adjustment disorder    5. Essential hypertension   6. Hypercalcemia   7. Overweight (BMI 25.0-29.9)   8. Seasonal allergies   9. Encounter for counseling for care management of patient with chronic conditions and complex health needs using nurse-based model   10. Counseling on health promotion and disease prevention      HYPERLIPIDEMIA- mgt per Cards In past- RYR and niacin made her Liver enzymes go up quickly, and got leg cramps---> wants to try to control it naturally.  Pt endorses that Cards told her "we'll give her 3 months to try to lower it through adequate diet and exercise "- they will recheck then and if not at goal they will readdress.     Await Cards input  Essential hypertension Blood pressure stable-no complaints  Adjustment disorder  Mood stable, no need for dose adjustment  Overweight (BMI 25.0-29.9) Explained to patient what BMI refers to, and what it means medically.    Told patient to think about it as a "medical risk stratification measurement" and how increasing BMI is associated with increasing risk/ or worsening state of various diseases such as hypertension, hyperlipidemia, diabetes, premature OA, depression etc.  American Heart Association guidelines for healthy diet, basically Mediterranean diet, and exercise guidelines of 30 minutes 5 days per week or more discussed in detail.  Health counseling performed.  All questions answered.   Seasonal allergies Encouraged patient to start over-the-counter Allegra or Claritin or her drug of choice for this.  Encouraged twice a day Nettie pot\ sinus rinses prophylactically.   When necessary Flonase. Counseling done  Vitamin D deficiency Patient will consistently take vitamin D 5000 international units daily.  Explained importance of at  least 1200 mg calcium daily as well for prevention of further osteopenia.   Recheck in 6 months.   Pt was in the office today for 25+ minutes, with over 50% time spent in face to face counseling of patient's various medical conditions and in coordination of care   Patient's Medications  New Prescriptions   CHOLECALCIFEROL (VITAMIN D3) 5000 UNITS TABS    5,000 IU OTC vitamin D3 daily.  Previous Medications   ASPIRIN 325 MG TABLET    Take 325 mg by mouth daily.   CYCLOBENZAPRINE (FLEXERIL) 5 MG TABLET    Take 5-10 mg by mouth 3 (three) times daily as needed for muscle spasms. Reported on 03/04/2016   EPINEPHRINE 0.3 MG/0.3 ML IJ SOAJ INJECTION    Inject 0.3 mg into the muscle as directed. Reported on 03/04/2016   LISINOPRIL (PRINIVIL,ZESTRIL) 10 MG TABLET    Take 1 tablet (10 mg total) by mouth daily.   METOPROLOL SUCCINATE (TOPROL-XL) 25 MG 24 HR TABLET    Take 1 tablet (25 mg total) by mouth as needed.   MULTIPLE VITAMIN (MULTIVITAMIN) TABLET    Take 1 tablet by mouth daily.   NUTRITIONAL SUPPLEMENTS (JUICE PLUS FIBRE PO)    Take by mouth daily.   PROPRANOLOL (INDERAL) 10 MG TABLET    Take one tablet by mouth as needed for palpitations up to 3 per day and at least 6 hours apart.   SPIRONOLACTONE (ALDACTONE) 25 MG TABLET    TAKE 1 TABLET BY MOUTH ONCE DAILY  Modified Medications   Modified Medication Previous Medication   ESCITALOPRAM (LEXAPRO) 10 MG TABLET escitalopram (LEXAPRO) 10 MG tablet  Take 1 tablet (10 mg total) by mouth daily.    Take 1 tablet (10 mg total) by mouth daily.  Discontinued Medications   No medications on file    Return in about 2 months (around 08/26/2016) for For wellness exam & health maintenance evaluation, flu vaccine, shingles vaccine, DEXA scan, colonos.  Anticipatory guidance and routine counseling done re: condition, txmnt options and need for follow up. All questions of patient's were answered.   Gross side effects, risk and benefits, and alternatives  of medications discussed with patient.  Patient is aware that all medications have potential side effects and we are unable to predict every sideeffect or drug-drug interaction that may occur.  Expresses verbal understanding and consents to current therapy plan and treatment regiment.  Please see AVS handed out to patient at the end of our visit for additional patient instructions/ counseling done pertaining to today's office visit.  Note: This document was prepared using Dragon voice recognition software and may include unintentional dictation errors.   ----------------------------------------------------------------------------------------------------------------------  Subjective:   CC: Samantha Stark is a 60 y.o. female who presents to [place] [facility] today for review of fasting labs that I ordered on 9\12\17.  Extensive health counseling was performed and preventative health counseling done   She has no acute complaints today.  HTN: Well controlled on current regimen-  treatment per cardiology  HYPERLIPIDEMIA-  Leg cramps with statins in past. - Dr [name]- Cards is following and treating her condition. Patient has never tried crestor.   LDL still elevated.   Patient prefers to do it "naturally "to control her hyperlipidemia, diet and exercise counseling done. Handouts provided.  Vitamin D a little on the low side- she will remember to take it faithfully daily.   Mood-well controlled on Lexapro   Wt Readings from Last 3 Encounters:  06/26/16 162 lb 4.8 oz (73.6 kg)  06/05/16 160 lb 3.2 oz (72.7 kg)  03/20/16 166 lb (75.3 kg)   BP Readings from Last 3 Encounters:  06/26/16 131/80  06/05/16 121/83  03/20/16 136/81   Pulse Readings from Last 3 Encounters:  06/26/16 69  06/05/16 74  03/20/16 73   BMI Readings from Last 3 Encounters:  06/26/16 29.69 kg/m  06/05/16 29.30 kg/m  03/20/16 30.36 kg/m       Full medical history updated and  reviewed in the office today  Patient Active Problem List   Diagnosis Date Noted  . Adjustment disorder  06/05/2016    Priority: High  . Essential hypertension 01/16/2008    Priority: High  . HYPERLIPIDEMIA- mgt per Cards 10/04/2007    Priority: High  . Overweight (BMI 25.0-29.9) 06/05/2016    Priority: Medium  . Benign heart Palpitations 06/29/2012    Priority: Medium  . Peripheral edema 06/05/2016    Priority: Low  . Osteopenia 03/15/2012    Priority: Low  . Vitamin D deficiency 05/06/2010    Priority: Low  . h/o HYPERCALCEMIA 05/06/2010    Priority: Low  . Diverticulosis of large intestine 07/18/2008    Priority: Low  . Seasonal allergies 06/27/2016  . Encounter for counseling for care management of patient with chronic conditions and complex health needs using nurse-based model 06/27/2016  . Counseling on health promotion and disease prevention 06/27/2016  . Acute cystitis 01/28/2016  . Non-toxic multinodular goiter 07/05/2013  . Pleural effusion 06/10/2013  . 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 Generalized anxiety disorder 07/18/2008    Past Medical  History:  Diagnosis Date  . Allergy   . Anxiety    Situational  . Cancer ([place])    scalp  . Chest pain 2006 & 2008   Negative nuclear stress study, Dr. [name]  . Diverticulosis   . Hyperlipidemia   . Hypertension   . MVP (mitral valve prolapse)   . Seasonal allergies     Past Surgical History:  Procedure Laterality Date  . ABDOMINAL HYSTERECTOMY    . COLONOSCOPY  2008 & 2011   [facility], negative except for Tics  . KNEE ARTHROSCOPY Bilateral 2002, 2007  . PARTIAL HYSTERECTOMY  1993  . ROTATOR CUFF REPAIR Right 2010  . SPINE SURGERY  1992   Lumbar laminectomy L5-S1  . TONSILLECTOMY  1976  . TUBAL LIGATION      Social History  Substance Use Topics  . Smoking status: Never Smoker  . Smokeless tobacco: Never Used  . Alcohol use 1.8 oz/week    3 Glasses of wine per week    family  history includes Alcohol abuse in her father, paternal aunt, and paternal uncle; Alzheimer's disease in her maternal aunt; Asthma in her mother; Colon cancer (age of onset: 68) in her maternal uncle; Colon polyps in her brother, maternal aunt, and mother; Diabetes in her maternal aunt, maternal grandmother, and paternal grandmother; Goiter in her mother; Heart attack in her maternal uncle; Hyperlipidemia in her brother, brother, mother, paternal aunt, and sister; Osteoporosis in her maternal grandmother; Stroke in her maternal grandfather.   Medications: Current Outpatient Prescriptions  Medication Sig Dispense Refill  . aspirin 325 MG tablet Take 325 mg by mouth daily.    . cyclobenzaprine (FLEXERIL) 5 MG tablet Take 5-10 mg by mouth 3 (three) times daily as needed for muscle spasms. Reported on 03/04/2016    . EPINEPHrine 0.3 mg/0.3 mL IJ SOAJ injection Inject 0.3 mg into the muscle as directed. Reported on 03/04/2016    . escitalopram (LEXAPRO) 10 MG tablet Take 1 tablet (10 mg total) by mouth daily. 90 tablet 1  . lisinopril (PRINIVIL,ZESTRIL) 10 MG tablet Take 1 tablet (10 mg total) by mouth daily. 90 tablet 2  . metoprolol succinate (TOPROL-XL) 25 MG 24 hr tablet Take 1 tablet (25 mg total) by mouth as needed. 30 tablet 6  . Multiple Vitamin (MULTIVITAMIN) tablet Take 1 tablet by mouth daily.    . Nutritional Supplements (JUICE PLUS FIBRE PO) Take by mouth daily.    . propranolol (INDERAL) 10 MG tablet Take one tablet by mouth as needed for palpitations up to 3 per day and at least 6 hours apart. 90 tablet 1  . spironolactone (ALDACTONE) 25 MG tablet TAKE 1 TABLET BY MOUTH ONCE DAILY (Patient taking differently: TAKE 1 TABLET BY MOUTH ONCE DAILY PRN) 90 tablet 0  . Cholecalciferol (VITAMIN D3) 5000 units TABS 5,000 IU OTC vitamin D3 daily. 90 tablet 90   No current facility-administered medications for this visit.     Allergies:  Allergies  Allergen Reactions  . Shrimp [Shellfish Allergy]      Angioedema caused by shrimp as well as oysters. EpiPen current  . Amlodipine Besylate     REACTION: edema, fatigue  . Calcium Channel Blockers     REACTION: edema , fatigue  . Cephalexin     REACTION: diarrhea  . Codeine     REACTION: nausea  . Coricidin D Cold-Flu-Sinus [Chlorphen-Pe-Acetaminophen] Hives  . Erythromycin Diarrhea  . Hydrochlorothiazide W-Triamterene     REACTION: low potassium  . Statins  Leg cramps with Lipitor & Pravachol     ROS:  Const:    no fevers, chills Eyes:    conjunctiva clear, no vision changes or blurred vision ENT:  no hearing difficulties, no dysphagia, no dysphonia, no nose bleeds CV:   no chest pain, arrhythmias, no orthopnea, no PND Pulm:   no SOB at rest or exertion, no Wheeze, no DIB, no hemoptysis GI:    no N/V/D/C, no abd pain GU:   no blood in urine or inc freq or urgency Heme/Onc:    no unexplained bleeding, no night sweats, no more fatigue than usual Neuro:   No dizziness, no LOC, No unexplained weakness or numbness Endo:   no unexplained wt loss or gain M-Sk:   no localized myalgias or arthralgias Psych:    No SI/HI, no memory prob or unexplained confusion    Objective:  Blood pressure 131/80, pulse 69, height 5\' 2"  (1.575 m), weight 162 lb 4.8 oz (73.6 kg). Body mass index is 29.69 kg/m.  Gen:   Well NAD, A and O *3 HEENT:    [place]/AT, EOMI,  MMM, OP- clr Lungs:   Normal work of breathing. CTA B/L, no Wh, rhonchi Heart:   RRR, S1, S2 WNL's, no MRG Abd:   Soft. No gross distention Exts:    warm, pink,  Brisk capillary refill, warm and well perfused.  Psych:    No HI/SI, judgement and insight good, Euthymic mood. Full Affect.   Recent Results (from the past 2160 hour(s))  Hepatic function panel     Status: Abnormal   Collection Time: 04/13/16 12:45 PM  Result Value Ref Range   Total Bilirubin 0.5 0.2 - 1.2 mg/dL   Bilirubin, Direct 0.1 <=0.2 mg/dL   Indirect Bilirubin 0.4 0.2 - 1.2 mg/dL   Alkaline Phosphatase 55 33  - 130 U/L   AST 83 (H) 10 - 35 U/L   ALT 37 (H) 6 - 29 U/L   Total Protein 6.3 6.1 - 8.1 g/dL   Albumin 4.1 3.6 - 5.1 g/dL  Lipid panel     Status: Abnormal   Collection Time: 04/13/16 12:45 PM  Result Value Ref Range   Cholesterol 276 (H) 125 - 200 mg/dL   Triglycerides 119 <150 mg/dL   HDL 68 >=46 mg/dL   Total CHOL/HDL Ratio 4.1 <=5.0 Ratio   VLDL 24 <30 mg/dL   LDL Cholesterol 184 (H) <130 mg/dL    Comment:   Total Cholesterol/HDL Ratio:CHD Risk                        Coronary Heart Disease Risk Table                                        Men       Women          1/2 Average Risk              3.4        3.3              Average Risk              5.0        4.4           2X Average Risk              9.6  7.1           3X Average Risk             23.4       11.0 Use the calculated Patient Ratio above and the CHD Risk table  to determine the patient's CHD Risk.   Lipid panel     Status: Abnormal   Collection Time: 05/01/16  8:04 AM  Result Value Ref Range   Cholesterol 226 (H) 125 - 200 mg/dL   Triglycerides 57 <150 mg/dL   HDL 51 >=46 mg/dL   Total CHOL/HDL Ratio 4.4 <=5.0 Ratio   VLDL 11 <30 mg/dL   LDL Cholesterol 164 (H) <130 mg/dL    Comment:   Total Cholesterol/HDL Ratio:CHD Risk                        Coronary Heart Disease Risk Table                                        Men       Women          1/2 Average Risk              3.4        3.3              Average Risk              5.0        4.4           2X Average Risk              9.6        7.1           3X Average Risk             23.4       11.0 Use the calculated Patient Ratio above and the CHD Risk table  to determine the patient's CHD Risk.   Hepatic function panel     Status: None   Collection Time: 05/01/16  8:04 AM  Result Value Ref Range   Total Bilirubin 0.6 0.2 - 1.2 mg/dL   Bilirubin, Direct 0.1 <=0.2 mg/dL   Indirect Bilirubin 0.5 0.2 - 1.2 mg/dL   Alkaline Phosphatase 61 33 - 130 U/L     AST 23 10 - 35 U/L   ALT 17 6 - 29 U/L   Total Protein 6.2 6.1 - 8.1 g/dL   Albumin 3.7 3.6 - 5.1 g/dL  Cardio IQ (R) Advanced Lipid Panel     Status: Abnormal   Collection Time: 05/15/16  8:26 AM  Result Value Ref Range   LDL Particle Number 1,809 1,016 - 2,185 nmol/L    Comment:   Risk: Optimal <1260; Moderate 1260-1538; High >1538    LDL Small 144 115 - 386 nmol/L    Comment:   Risk: Optimal <162; Moderate 162-217; High >217    LDL Medium 339 121 - 397 nmol/L    Comment:   Risk: Optimal <201; Moderate 201-271; High >271    LDL Large 6,373 5,038 - 17,886 nmol/L    Comment:   Risk: Optimal >9386; Moderate [id number]; High <6996    LDL Pattern A A Pattern    Comment:   Risk: Optimal Pattern A; High Pattern B    LDL Peak Size 223.6 >=218.2  Angstrom    Comment:   Risk: Optimal >222.5; Moderate 222.5-218.2; High <218.2   Adult cardiovascular event risk category cut points (optimal, moderate, high) are based on adult [place] reference population. Association between lipoprotein subfractions and cardiovascular events is based on Musunuru et al. Christie Nottingham. [id number].   This test was developed and its analytical performance characteristics have been determined by [facility]. It has not been cleared or approved by FDA. This assay has been validated pursuant to the CLIA regulations and is used for clinical purposes.      Cholesterol, Total 285 (H) 125 - 200 mg/dL    Comment:   Adult Reference Ranges for Cholesterol, Total:**   > or = 20 Years:  125-200 mg/dL    <200    (Desirable)  200-239 (Borderline)  >=240   (Higher Risk)   References:   ** An executive summary of the NCEP guidelines, the "Third Report of the NCEP Expert Panel on Detection, Evaluation, and Treatment of High Blood Cholesterol in Adults." Journal of the American Medical Association. Feb 25, 2000.    HDL Cholesterol 55 >=46 mg/dL   Triglycerides 102 mg/dL     Comment:   Adult Reference Ranges for Triglycerides**:      <150 mg/dL     (Normal)    150-199 mg/dL  (Borderline High)    200-499 mg/dL  (High)    >=500 mg/dL    (Very High)   References:   ** An executive summary of the NCEP guidelines, the "Third Report of the NCEP Expert Panel on Detection, Evaluation, and Treatment of High Blood Cholesterol in Adults." Journal of the American Medical Association. Feb 25, 2000.    LDL, Calculated 210 (H) mg/dL    Comment:                                                 Reference Range:                                                 <130     (DESIRABLE)                                                 130-159  (BORDERLINE)                                                 >=160    (HIGH)    LDL-C levels >=190 mg/dL may indicate familial hypercholesterolemia (FH). Clinical assessment and measurement of blood lipid levels should be considered for all first degree relatives of patients with an FH diagnosis. J of Clinical Lipidology 5:S1-S8 2011.   Desirable range <100 mg/dL for patients with CHD or diabetes and <70 mg/dL for diabetic patients with known heart disease.    Cholesterol/HDL Ratio 5.2 (H) <=5.0 calc   Non-HDL Cholesterol 230 mg/dL    Comment:   Target for non-HDL cholesterol is 30  mg/dL higher than LDL cholesterol target    Apolipoprotein B 122 (H) 49 - 103 mg/dL    Comment:   Risk: Optimal < 80 mg/dL; Moderate 80-119 mg/dL; High > or = 120 mg/dL Cardiovascular event risk category cut points (optimal, moderate, high) are based on National Lipid Association recommendations - Lynnell Dike al. J Clin Lipidol. 2011;5:338    Lipoprotein (a) 81 (H) <75 nmol/L    Comment:   Risk: Optimal < 75 nmol/L; Moderate 75-125 nmol/L; High > 125 nmol/L Cardiovascular event risk category cut points (optimal, moderate, high) are based on Marcovina et al. Clin Chem. [id number] and Nordestgaard et al. European Heart J. [phone] (results  of meta-analysis and expert panel recommendations).   C-reactive protein     Status: None   Collection Time: 05/15/16  8:26 AM  Result Value Ref Range   CRP <0.5 <0.60 mg/dL  RPR     Status: None   Collection Time: 06/23/16  8:49 AM  Result Value Ref Range   RPR Ser Ql NON REAC NON REAC  Vitamin B12     Status: None   Collection Time: 06/23/16  8:49 AM  Result Value Ref Range   Vitamin B-12 606 200 - 1,100 pg/mL  Sedimentation rate     Status: None   Collection Time: 06/23/16  8:49 AM  Result Value Ref Range   Sed Rate 9 0 - 30 mm/hr  CBC     Status: None   Collection Time: 06/23/16  8:49 AM  Result Value Ref Range   WBC 5.1 3.8 - 10.8 K/uL   RBC 4.60 3.80 - 5.10 MIL/uL   Hemoglobin 13.8 11.7 - 15.5 g/dL   HCT 40.9 35.0 - 45.0 %   MCV 88.9 80.0 - 100.0 fL   MCH 30.0 27.0 - 33.0 pg   MCHC 33.7 32.0 - 36.0 g/dL   RDW 13.2 11.0 - 15.0 %   Platelets 228 140 - 400 K/uL   MPV 9.6 7.5 - 12.5 fL  Folate     Status: None   Collection Time: 06/23/16  8:49 AM  Result Value Ref Range   Folate 17.8 >5.4 ng/mL    Comment: Reference Range >17 years:   Low: <3.4 ng/mL              Borderline: 3.4-5.4 ng/mL              Normal: >5.4 ng/mL     COMPLETE METABOLIC PANEL WITH GFR     Status: None   Collection Time: 06/23/16  8:49 AM  Result Value Ref Range   Sodium 140 135 - 146 mmol/L   Potassium 4.2 3.5 - 5.3 mmol/L   Chloride 104 98 - 110 mmol/L   CO2 28 20 - 31 mmol/L   Glucose, Bld 82 65 - 99 mg/dL   BUN 12 7 - 25 mg/dL   Creat 0.65 0.50 - 0.99 mg/dL    Comment:   For patients > or = 61 years of age: The upper reference limit for Creatinine is approximately 13% higher for people identified as African-American.      Total Bilirubin 0.4 0.2 - 1.2 mg/dL   Alkaline Phosphatase 57 33 - 130 U/L   AST 22 10 - 35 U/L   ALT 16 6 - 29 U/L   Total Protein 6.9 6.1 - 8.1 g/dL   Albumin 4.1 3.6 - 5.1 g/dL   Calcium 10.3 8.6 - 10.4 mg/dL   GFR, Est African American >89 >=  60 mL/min    GFR, Est Non African American >89 >=60 mL/min  Hemoglobin A1c     Status: None   Collection Time: 06/23/16  8:49 AM  Result Value Ref Range   Hgb A1c MFr Bld 5.2 <5.7 %    Comment:   For the purpose of screening for the presence of diabetes:   <5.7%       Consistent with the absence of diabetes 5.7-6.4 %   Consistent with increased risk for diabetes (prediabetes) >=6.5 %     Consistent with diabetes   This assay result is consistent with a decreased risk of diabetes.   Currently, no consensus exists regarding use of hemoglobin A1c for diagnosis of diabetes in children.   According to American Diabetes Association (ADA) guidelines, hemoglobin A1c <7.0% represents optimal control in non-pregnant diabetic patients. Different metrics may apply to specific patient populations. Standards of Medical Care in Diabetes (ADA).      Mean Plasma Glucose 103 mg/dL  Hepatitis C antibody     Status: None   Collection Time: 06/23/16  8:49 AM  Result Value Ref Range   HCV Ab NEGATIVE NEGATIVE  Lipid panel     Status: Abnormal   Collection Time: 06/23/16  8:49 AM  Result Value Ref Range   Cholesterol 280 (H) 125 - 200 mg/dL   Triglycerides 110 <150 mg/dL   HDL 53 >=46 mg/dL   Total CHOL/HDL Ratio 5.3 (H) <=5.0 Ratio   VLDL 22 <30 mg/dL   LDL Cholesterol 205 (H) <130 mg/dL    Comment:   Total Cholesterol/HDL Ratio:CHD Risk                        Coronary Heart Disease Risk Table                                        Men       Women          1/2 Average Risk              3.4        3.3              Average Risk              5.0        4.4           2X Average Risk              9.6        7.1           3X Average Risk             23.4       11.0 Use the calculated Patient Ratio above and the CHD Risk table  to determine the patient's CHD Risk.   HIV antibody     Status: None   Collection Time: 06/23/16  8:49 AM  Result Value Ref Range   HIV 1&2 Ab, 4th Generation NONREACTIVE  NONREACTIVE    Comment:   HIV-1 antigen and HIV-1/HIV-2 antibodies were not detected.  There is no laboratory evidence of HIV infection.   HIV-1/2 Antibody Diff        Not indicated. HIV-1 RNA, Qual TMA          Not indicated.     PLEASE NOTE: This information has  been disclosed to you from records whose confidentiality may be protected by state law. If your state requires such protection, then the state law prohibits you from making any further disclosure of the information without the specific written consent of the person to whom it pertains, or as otherwise permitted by law. A general authorization for the release of medical or other information is NOT sufficient for this purpose.   The performance of this assay has not been clinically validated in patients less than 22 years old.   For additional information please refer to http://education.quest[facility]ostics.com/faq/FAQ106.  (This link is being provided for informational/educational purposes only.)     Phosphorus     Status: None   Collection Time: 06/23/16  8:49 AM  Result Value Ref Range   Phosphorus 3.6 2.5 - 4.5 mg/dL  TSH     Status: None   Collection Time: 06/23/16  8:49 AM  Result Value Ref Range   TSH 3.61 mIU/L    Comment:   Reference Range   > or = 20 Years  0.40-4.50   Pregnancy Range First trimester  0.26-2.66 Second trimester 0.55-2.73 Third trimester  0.43-2.91     VITAMIN D 25 Hydroxy (Vit-D Deficiency, Fractures)     Status: None   Collection Time: 06/23/16  8:49 AM  Result Value Ref Range   Vit D, 25-Hydroxy 32 30 - 100 ng/mL    Comment: Vitamin D Status           25-OH Vitamin D        Deficiency                <20 ng/mL        Insufficiency         20 - 29 ng/mL        Optimal             > or = 30 ng/mL   For 25-OH Vitamin D testing on patients on D2-supplementation and patients for whom quantitation of D2 and D3 fractions is required, the [facility] 25-OH VIT D, (D2,D3), LC/MS/MS is  recommended: order code [phone] (patients > 2 yrs).

## 2016-06-26 NOTE — Assessment & Plan Note (Addendum)
In past- RYR and niacin made her Liver enzymes go up quickly, and got leg cramps---> wants to try to control it naturally.  Pt endorses that Cards told her "we'll give her 3 months to try to lower it through adequate diet and exercise "- they will recheck then and if not at goal they will readdress.     Await Cards input

## 2016-06-27 DIAGNOSIS — J302 Other seasonal allergic rhinitis: Secondary | ICD-10-CM | POA: Insufficient documentation

## 2016-06-27 DIAGNOSIS — Z7189 Other specified counseling: Secondary | ICD-10-CM | POA: Insufficient documentation

## 2016-06-27 HISTORY — DX: Other specified counseling: Z71.89

## 2016-06-27 NOTE — Assessment & Plan Note (Signed)
Mood stable, no need for dose adjustment

## 2016-06-27 NOTE — Assessment & Plan Note (Signed)

## 2016-06-27 NOTE — Assessment & Plan Note (Signed)
Patient will consistently take vitamin D 5000 international units daily.  Explained importance of at least 1200 mg calcium daily as well for prevention of further osteopenia.   Recheck in 6 months.

## 2016-06-27 NOTE — Assessment & Plan Note (Signed)
Blood pressure stable-no complaints

## 2016-06-27 NOTE — Assessment & Plan Note (Signed)
Encouraged patient to start over-the-counter Allegra or Claritin or her drug of choice for this.  Encouraged twice a day Nettie pot\ sinus rinses prophylactically.   When necessary Flonase. Counseling done

## 2016-07-14 ENCOUNTER — Other Ambulatory Visit: Payer: Self-pay

## 2016-07-14 DIAGNOSIS — I10 Essential (primary) hypertension: Secondary | ICD-10-CM

## 2016-07-14 DIAGNOSIS — F411 Generalized anxiety disorder: Secondary | ICD-10-CM

## 2016-07-14 MED ORDER — ESCITALOPRAM OXALATE 10 MG PO TABS: 10.0000 mg | ORAL_TABLET | Freq: Every day | ORAL | 0 refills | Status: DC

## 2016-07-14 MED ORDER — LISINOPRIL 10 MG PO TABS: 10.0000 mg | ORAL_TABLET | Freq: Every day | ORAL | 0 refills | Status: DC

## 2016-07-14 NOTE — Telephone Encounter (Signed)
Pt called stating that she is in [place], [place] and forgot her lisinopril and Lexapro.  She is requesting a 5 day supply until she returns home.  RXs for both medications sent to [facility] [place], [place] per patient request.  Charyl Bigger, CMA

## 2016-08-10 ENCOUNTER — Other Ambulatory Visit: Payer: Self-pay

## 2016-08-10 DIAGNOSIS — F411 Generalized anxiety disorder: Secondary | ICD-10-CM

## 2016-08-10 DIAGNOSIS — I10 Essential (primary) hypertension: Secondary | ICD-10-CM

## 2016-08-11 MED ORDER — ESCITALOPRAM OXALATE 10 MG PO TABS: 10.0000 mg | ORAL_TABLET | Freq: Every day | ORAL | 0 refills | Status: DC

## 2016-08-11 MED ORDER — LISINOPRIL 10 MG PO TABS: 10.0000 mg | ORAL_TABLET | Freq: Every day | ORAL | 0 refills | Status: DC

## 2016-08-24 ENCOUNTER — Ambulatory Visit (INDEPENDENT_AMBULATORY_CARE_PROVIDER_SITE_OTHER): Payer: 59 | Admitting: Family Medicine

## 2016-08-24 VITALS — BP 144/85 | HR 70 | Ht 62.0 in | Wt 164.0 lb

## 2016-08-24 DIAGNOSIS — Z2911 Encounter for prophylactic immunotherapy for respiratory syncytial virus (RSV): Secondary | ICD-10-CM

## 2016-08-24 DIAGNOSIS — Z1389 Encounter for screening for other disorder: Secondary | ICD-10-CM

## 2016-08-24 DIAGNOSIS — Z780 Asymptomatic menopausal state: Secondary | ICD-10-CM

## 2016-08-24 DIAGNOSIS — Z23 Encounter for immunization: Secondary | ICD-10-CM | POA: Diagnosis not present

## 2016-08-24 DIAGNOSIS — Z0000 Encounter for general adult medical examination without abnormal findings: Secondary | ICD-10-CM

## 2016-08-24 DIAGNOSIS — M8589 Other specified disorders of bone density and structure, multiple sites: Secondary | ICD-10-CM | POA: Diagnosis not present

## 2016-08-24 DIAGNOSIS — L570 Actinic keratosis: Secondary | ICD-10-CM | POA: Insufficient documentation

## 2016-08-24 NOTE — Progress Notes (Signed)
Impression and Recommendations:    1. Encounter for wellness examination   2. Need for Zostavax administration   3. Need for prophylactic vaccination and inoculation against influenza   4. Osteopenia of multiple sites   5. Postmenopausal   6. Screening for multiple conditions   7. Actinic keratoses- scalp     Please see orders section below for further details of actions taken during this office visit.  Gross side effects, risk and benefits, and alternatives of medications discussed with patient.  Patient is aware that all medications have potential side effects and we are unable to predict every side effect or drug-drug interaction that may occur.  Expresses verbal understanding and consents to current therapy plan and treatment regiment.  1) Anticipatory Guidance: Discussed importance of wearing a seatbelt while driving, not texting while driving; sunscreen when outside along with yearly skin surveillance; eating a well balanced and modest diet; physical activity at least 25 minutes per day or 150 min/ week of moderate to intense activity.  2) Immunizations / Screenings / Labs:  All immunizations and screenings that patient agrees to, are up-to-date per recommendations or will be updated today.  Patient understands the needs for q 16mo dental and yearly vision screens which pt will schedule independently.   3) Weight:   Discussed goal of losing even 5-10% of current body weight which would improve overall feelings of well being and improve objective health data significantly.   Improve nutrient density of diet through increasing intake of fruits and vegetables and decreasing saturated/trans fats, white flour products and refined sugar products.    Orders Placed This Encounter  Procedures  . DG Bone Density  . Varicella-zoster vaccine subcutaneous  . Flu Vaccine QUAD 36+ mos IM   F-up preventative CPE in 1 year. F/up sooner for chronic care management as discussed and/or  prn.  Please see orders placed and AVS handed out to patient at the end of our visit for further patient instructions/ counseling done pertaining to today's office visit.     Subjective:    Chief Complaint  Patient presents with  . Annual Exam    HPI: Samantha Stark is a 60 y.o. female who presents to [facility] today a yearly health maintenance exam.  Health Maintenance Summary Reviewed and updated, unless pt declines services.  Aspirin: administering 81 mg daily Colonoscopy:     (Unnecessary secondary to < 22 or > 70 years old.) Tdap: Up to date: needs TD  Zostavax:    Will update today. Tobacco History Reviewed:   Y  Alcohol:    No concerns, no excessive use Exercise Habits:   Not meeting AHA guidelines; teaching yoga once a week.  STD concerns:   none Birth control method:   n/a Menses regular:     n/a Lumps or breast concerns:      no Breast Cancer Family History:      No Bone/ DEXA scan:    Done 8/14--> will order again.  ( Unnecessary due to < 65 and average risk)  [email or web address]  Written by Hendricks Limes, MD on 06/04/2013 11:45 AM  Findings : lowest T score - 1.1 @ right femoral neck (hip)  Diagnosis: minimal Osteopenia  Recommended lifestyle interventions to prevent Osteoporosis include calcium 600 mg once a day & vitamin D3 supplementation to keep vit D level @ least 40-60. The usual vitamin D3 dose is 1000 IU daily; but individual dose is determined by annual  vitamin D level monitor. Also weight bearing exercise such as walking 30-45 minutes 3-4 X per week is recommended.  Repeat BMD every 25 months. Hopp    Wt Readings from Last 3 Encounters:  08/24/16 164 lb (74.4 kg)  06/26/16 162 lb 4.8 oz (73.6 kg)  06/05/16 160 lb 3.2 oz (72.7 kg)   BP Readings from Last 3 Encounters:  08/24/16 (!) 144/85  06/26/16 131/80  06/05/16 121/83   Pulse Readings from Last 3 Encounters:  08/24/16 70  06/26/16 69   06/05/16 74     Past Medical History:  Diagnosis Date  . Allergy   . Anxiety    Situational  . Cancer ([place])    scalp  . Chest pain 2006 & 2008   Negative nuclear stress study, Dr. [name]  . Diverticulosis   . Hyperlipidemia   . Hypertension   . MVP (mitral valve prolapse)   . Seasonal allergies       Past Surgical History:  Procedure Laterality Date  . ABDOMINAL HYSTERECTOMY    . COLONOSCOPY  2008 & 2011   [facility], negative except for Tics  . KNEE ARTHROSCOPY Bilateral 2002, 2007  . PARTIAL HYSTERECTOMY  1993  . ROTATOR CUFF REPAIR Right 2010  . SPINE SURGERY  1992   Lumbar laminectomy L5-S1  . TONSILLECTOMY  1976  . TUBAL LIGATION        Family History  Problem Relation Age of Onset  . Heart attack Maternal Uncle     MI in 80s  . Colon cancer Maternal Uncle 61  . Osteoporosis Maternal Grandmother   . Diabetes Maternal Grandmother   . Colon polyps Mother   . Hyperlipidemia Mother   . Goiter Mother     also Rosalee Kaufman  . Asthma Mother   . Colon polyps Brother   . Hyperlipidemia Brother     X2  . Colon polyps Maternal Aunt   . Diabetes Maternal Aunt     X2  . Alcohol abuse Father   . Hyperlipidemia Sister   . Stroke Maternal Grandfather     in 89s  . Alzheimer's disease Maternal Aunt   . Alcohol abuse Paternal Aunt   . Hyperlipidemia Paternal Aunt   . Alcohol abuse Paternal Uncle   . Diabetes Paternal Grandmother   . Hyperlipidemia Brother       History  Drug Use No  ,   History  Alcohol Use  . 1.8 oz/week  . 3 Glasses of wine per week  ,   History  Smoking Status  . Never Smoker  Smokeless Tobacco  . Never Used  ,   History  Sexual Activity  . Sexual activity: Yes  . Partners: Male  . Birth control/ protection: Surgical    Current Outpatient Prescriptions on File Prior to Visit  Medication Sig Dispense Refill  . aspirin 325 MG tablet Take 325 mg by mouth daily.    . Cholecalciferol (VITAMIN D3) 5000 units TABS  5,000 IU OTC vitamin D3 daily. 90 tablet 90  . cyclobenzaprine (FLEXERIL) 5 MG tablet Take 5-10 mg by mouth 3 (three) times daily as needed for muscle spasms. Reported on 03/04/2016    . EPINEPHrine 0.3 mg/0.3 mL IJ SOAJ injection Inject 0.3 mg into the muscle as directed. Reported on 03/04/2016    . escitalopram (LEXAPRO) 10 MG tablet Take 1 tablet (10 mg total) by mouth daily. 90 tablet 0  . lisinopril (PRINIVIL,ZESTRIL) 10 MG tablet Take 1 tablet (10 mg total)  by mouth daily. 90 tablet 0  . metoprolol succinate (TOPROL-XL) 25 MG 24 hr tablet Take 1 tablet (25 mg total) by mouth as needed. 30 tablet 6  . Multiple Vitamin (MULTIVITAMIN) tablet Take 1 tablet by mouth daily.    . Nutritional Supplements (JUICE PLUS FIBRE PO) Take by mouth daily.    . propranolol (INDERAL) 10 MG tablet Take one tablet by mouth as needed for palpitations up to 3 per day and at least 6 hours apart. 90 tablet 1  . spironolactone (ALDACTONE) 25 MG tablet TAKE 1 TABLET BY MOUTH ONCE DAILY (Patient taking differently: TAKE 1 TABLET BY MOUTH ONCE DAILY PRN) 90 tablet 0   No current facility-administered medications on file prior to visit.     Allergies: Shrimp [shellfish allergy]; Amlodipine besylate; Calcium channel blockers; Cephalexin; Codeine; Coricidin d cold-flu-sinus [chlorphen-pe-acetaminophen]; Erythromycin; Hydrochlorothiazide w-triamterene; and Statins  Review of Systems  Constitutional: Negative.  Negative for chills, diaphoresis, fever, malaise/fatigue and weight loss.  HENT: Negative.  Negative for congestion, sore throat and tinnitus.   Eyes: Negative.  Negative for blurred vision, double vision and photophobia.  Respiratory: Negative.  Negative for cough and wheezing.   Cardiovascular: Negative.  Negative for chest pain and palpitations.  Gastrointestinal: Negative.  Negative for blood in stool, diarrhea, nausea and vomiting.  Genitourinary: Negative.  Negative for dysuria, frequency and urgency.   Musculoskeletal: Negative.  Negative for joint pain and myalgias.  Skin: Negative.  Negative for itching and rash.  Neurological: Negative.  Negative for dizziness, focal weakness, weakness and headaches.  Endo/Heme/Allergies: Negative.  Negative for environmental allergies and polydipsia. Does not bruise/bleed easily.  Psychiatric/Behavioral: Negative.  Negative for depression and memory loss. The patient is not nervous/anxious and does not have insomnia.      Objective:    Blood pressure (!) 144/85, pulse 70, height 5\' 2"  (1.575 m), weight 164 lb (74.4 kg). Body mass index is 30 kg/m. General Appearance:    Alert, cooperative, no distress, appears stated age  Head:    Normocephalic, without obvious abnormality, atraumatic  Eyes:    PERRL, conjunctiva/corneas clear, EOM's intact, fundi    benign, both eyes  Ears:    Normal TM's and external ear canals, both ears  Nose:   Nares normal, septum midline, mucosa normal, no drainage    or sinus tenderness  Throat:   Lips w/o lesion, mucosa moist, and tongue normal; teeth and   gums normal  Neck:   Supple, symmetrical, trachea midline, no adenopathy;    thyroid:  no enlargement/tenderness/nodules; no carotid   bruit or JVD  Back:     Symmetric, no curvature, ROM normal, no CVA tenderness  Lungs:     Clear to auscultation bilaterally, respirations unlabored, no       Wh/ R/ R  Chest Wall:    No tenderness or gross deformity; normal excursion   Heart:    Regular rate and rhythm, S1 and S2 normal, no murmur, rub   or gallop  Breast Exam:    deferred  Abdomen:     Soft, non-tender, bowel sounds active all four quadrants, NO   G/R/R, no masses, no organomegaly  Genitalia:   deferred- recently done   Rectal:    Rectal deferred  Extremities:   Extremities normal, atraumatic, no cyanosis or gross edema  Pulses:   2+ and symmetric all extremities  Skin:   Warm, dry, Skin color, texture, turgor normal, no obvious rashes or lesions Psych: No  HI/SI, judgement and  insight good, Euthymic mood. Full Affect.  Neurologic:   CNII-XII intact, normal strength, sensation and reflexes    Throughout   DEXA:    [email or web address]   Based on the [place] FRAX tool, a 61 year old white woman with no other risk factors has a 9.3% 10-year risk for any osteoporotic fracture. White women between the ages of 56 and 95 years with equivalent or greater 10-year fracture risks based on specific risk factors include but are not limited to the following persons: 58) a 61 year old current smoker with a BMI less than 21 kg/m2, daily alcohol use, and parental fracture history; 60) a 61 year old woman with a parental fracture history; 7) a 61 year old woman with a BMI less than 21 kg/m2 and daily alcohol use; and 34) a 61 year old current smoker with daily alcohol use.   The FRAX tool also predicts 10-year fracture risks for black, Asian, and Hispanic women in the [place]. In general, estimated fracture risks in nonwhite women are lower than those for white women of the same age. Although the USPSTF recommends using a 9.3% 10-year fracture risk threshold to screen women aged 63 to 48 years,

## 2016-08-24 NOTE — Patient Instructions (Signed)
Preventive Care for Adults, Female  A healthy lifestyle and preventive care can promote health and wellness. Preventive health guidelines for women include the following key practices.   A routine yearly physical is a good way to check with your health care provider about your health and preventive screening. It is a chance to share any concerns and updates on your health and to receive a thorough exam.   Visit your dentist for a routine exam and preventive care every 6 months. Brush your teeth twice a day and floss once a day. Good oral hygiene prevents tooth decay and gum disease.   The frequency of eye exams is based on your age, health, family medical history, use of contact lenses, and other factors. Follow your health care provider's recommendations for frequency of eye exams.   Eat a healthy diet. Foods like vegetables, fruits, whole grains, low-fat dairy products, and lean protein foods contain the nutrients you need without too many calories. Decrease your intake of foods high in solid fats, added sugars, and salt. Eat the right amount of calories for you.Get information about a proper diet from your health care provider, if necessary.   Regular physical exercise is one of the most important things you can do for your health. Most adults should get at least 150 minutes of moderate-intensity exercise (any activity that increases your heart rate and causes you to sweat) each week. In addition, most adults need muscle-strengthening exercises on 2 or more days a week.   Maintain a healthy weight. The body mass index (BMI) is a screening tool to identify possible weight problems. It provides an estimate of body fat based on height and weight. Your health care provider can find your BMI, and can help you achieve or maintain a healthy weight.For adults 20 years and older:   - A BMI below 18.5 is considered underweight.   - A BMI of 18.5 to 24.9 is normal.   - A BMI of 25 to 29.9 is  considered overweight.   - A BMI of 30 and above is considered obese.   Maintain normal blood lipids and cholesterol levels by exercising and minimizing your intake of trans and saturated fats.  Eat a balanced diet with plenty of fruit and vegetables. Blood tests for lipids and cholesterol should begin at age 20 and be repeated every 5 years minimum.  If your lipid or cholesterol levels are high, you are over 40, or you are at high risk for heart disease, you may need your cholesterol levels checked more frequently.Ongoing high lipid and cholesterol levels should be treated with medicines if diet and exercise are not working.   If you smoke, find out from your health care provider how to quit. If you do not use tobacco, do not start.   Lung cancer screening is recommended for adults aged 55-80 years who are at high risk for developing lung cancer because of a history of smoking. A yearly low-dose CT scan of the lungs is recommended for people who have at least a 30-pack-year history of smoking and are a current smoker or have quit within the past 15 years. A pack year of smoking is smoking an average of 1 pack of cigarettes a day for 1 year (for example: 1 pack a day for 30 years or 2 packs a day for 15 years). Yearly screening should continue until the smoker has stopped smoking for at least 15 years. Yearly screening should be stopped for people who develop a   health problem that would prevent them from having lung cancer treatment.   If you are pregnant, do not drink alcohol. If you are breastfeeding, be very cautious about drinking alcohol. If you are not pregnant and choose to drink alcohol, do not have more than 1 drink per day. One drink is considered to be 12 ounces (355 mL) of beer, 5 ounces (148 mL) of wine, or 1.5 ounces (44 mL) of liquor.   Avoid use of street drugs. Do not share needles with anyone. Ask for help if you need support or instructions about stopping the use of  drugs.   High blood pressure causes heart disease and increases the risk of stroke. Your blood pressure should be checked at least yearly.  Ongoing high blood pressure should be treated with medicines if weight loss and exercise do not work.   If you are 69-55 years old, ask your health care provider if you should take aspirin to prevent strokes.   Diabetes screening involves taking a blood sample to check your fasting blood sugar level. This should be done once every 3 years, after age 38, if you are within normal weight and without risk factors for diabetes. Testing should be considered at a younger age or be carried out more frequently if you are overweight and have at least 1 risk factor for diabetes.   Breast cancer screening is essential preventive care for women. You should practice "breast self-awareness."  This means understanding the normal appearance and feel of your breasts and may include breast self-examination.  Any changes detected, no matter how small, should be reported to a health care provider.  Women in their 80s and 30s should have a clinical breast exam (CBE) by a health care provider as part of a regular health exam every 1 to 3 years.  After age 66, women should have a CBE every year.  Starting at age 1, women should consider having a mammogram (breast X-ray test) every year.  Women who have a family history of breast cancer should talk to their health care provider about genetic screening.  Women at a high risk of breast cancer should talk to their health care providers about having an MRI and a mammogram every year.   -Breast cancer gene (BRCA)-related cancer risk assessment is recommended for women who have family members with BRCA-related cancers. BRCA-related cancers include breast, ovarian, tubal, and peritoneal cancers. Having family members with these cancers may be associated with an increased risk for harmful changes (mutations) in the breast cancer genes BRCA1 and  BRCA2. Results of the assessment will determine the need for genetic counseling and BRCA1 and BRCA2 testing.   The Pap test is a screening test for cervical cancer. A Pap test can show cell changes on the cervix that might become cervical cancer if left untreated. A Pap test is a procedure in which cells are obtained and examined from the lower end of the uterus (cervix).   - Women should have a Pap test starting at age 57.   - Between ages 90 and 70, Pap tests should be repeated every 2 years.   - Beginning at age 63, you should have a Pap test every 3 years as long as the past 3 Pap tests have been normal.   - Some women have medical problems that increase the chance of getting cervical cancer. Talk to your health care provider about these problems. It is especially important to talk to your health care provider if a  new problem develops soon after your last Pap test. In these cases, your health care provider may recommend more frequent screening and Pap tests.   - The above recommendations are the same for women who have or have not gotten the vaccine for human papillomavirus (HPV).   - If you had a hysterectomy for a problem that was not cancer or a condition that could lead to cancer, then you no longer need Pap tests. Even if you no longer need a Pap test, a regular exam is a good idea to make sure no other problems are starting.   - If you are between ages 36 and 66 years, and you have had normal Pap tests going back 10 years, you no longer need Pap tests. Even if you no longer need a Pap test, a regular exam is a good idea to make sure no other problems are starting.   - If you have had past treatment for cervical cancer or a condition that could lead to cancer, you need Pap tests and screening for cancer for at least 20 years after your treatment.   - If Pap tests have been discontinued, risk factors (such as a new sexual partner) need to be reassessed to determine if screening should  be resumed.   - The HPV test is an additional test that may be used for cervical cancer screening. The HPV test looks for the virus that can cause the cell changes on the cervix. The cells collected during the Pap test can be tested for HPV. The HPV test could be used to screen women aged 70 years and older, and should be used in women of any age who have unclear Pap test results. After the age of 67, women should have HPV testing at the same frequency as a Pap test.   Colorectal cancer can be detected and often prevented. Most routine colorectal cancer screening begins at the age of 57 years and continues through age 26 years. However, your health care provider may recommend screening at an earlier age if you have risk factors for colon cancer. On a yearly basis, your health care provider may provide home test kits to check for hidden blood in the stool.  Use of a small camera at the end of a tube, to directly examine the colon (sigmoidoscopy or colonoscopy), can detect the earliest forms of colorectal cancer. Talk to your health care provider about this at age 23, when routine screening begins. Direct exam of the colon should be repeated every 5 -10 years through age 49 years, unless early forms of pre-cancerous polyps or small growths are found.   People who are at an increased risk for hepatitis B should be screened for this virus. You are considered at high risk for hepatitis B if:  -You were born in a country where hepatitis B occurs often. Talk with your health care provider about which countries are considered high risk.  - Your parents were born in a high-risk country and you have not received a shot to protect against hepatitis B (hepatitis B vaccine).  - You have HIV or AIDS.  - You use needles to inject street drugs.  - You live with, or have sex with, someone who has Hepatitis B.  - You get hemodialysis treatment.  - You take certain medicines for conditions like cancer, organ  transplantation, and autoimmune conditions.   Hepatitis C blood testing is recommended for all people born from [birth year] through 1965 and any individual  with known risks for hepatitis C.   Practice safe sex. Use condoms and avoid high-risk sexual practices to reduce the spread of sexually transmitted infections (STIs). STIs include gonorrhea, chlamydia, syphilis, trichomonas, herpes, HPV, and human immunodeficiency virus (HIV). Herpes, HIV, and HPV are viral illnesses that have no cure. They can result in disability, cancer, and death. Sexually active women aged 25 years and younger should be checked for chlamydia. Older women with new or multiple partners should also be tested for chlamydia. Testing for other STIs is recommended if you are sexually active and at increased risk.   Osteoporosis is a disease in which the bones lose minerals and strength with aging. This can result in serious bone fractures or breaks. The risk of osteoporosis can be identified using a bone density scan. Women ages 65 years and over and women at risk for fractures or osteoporosis should discuss screening with their health care providers. Ask your health care provider whether you should take a calcium supplement or vitamin D to There are also several preventive steps women can take to avoid osteoporosis and resulting fractures or to keep osteoporosis from worsening. -->Recommendations include:  Eat a balanced diet high in fruits, vegetables, calcium, and vitamins.  Get enough calcium. The recommended total intake of is 1,200 mg daily; for best absorption, if taking supplements, divide doses into 250-500 mg doses throughout the day. Of the two types of calcium, calcium carbonate is best absorbed when taken with food but calcium citrate can be taken on an empty stomach.  Get enough vitamin D. NAMS and the National Osteoporosis Foundation recommend at least 1,000 IU per day for women age 50 and over who are at risk of vitamin D  deficiency. Vitamin D deficiency can be caused by inadequate sun exposure (for example, those who live in northern latitudes).  Avoid alcohol and smoking. Heavy alcohol intake (more than 7 drinks per week) increases the risk of falls and hip fracture and women smokers tend to lose bone more rapidly and have lower bone mass than nonsmokers. Stopping smoking is one of the most important changes women can make to improve their health and decrease risk for disease.  Be physically active every day. Weight-bearing exercise (for example, fast walking, hiking, jogging, and weight training) may strengthen bones or slow the rate of bone loss that comes with aging. Balancing and muscle-strengthening exercises can reduce the risk of falling and fracture.  Consider therapeutic medications. Currently, several types of effective drugs are available. Healthcare providers can recommend the type most appropriate for each woman.  Eliminate environmental factors that may contribute to accidents. Falls cause nearly 90% of all osteoporotic fractures, so reducing this risk is an important bone-health strategy. Measures include ample lighting, removing obstructions to walking, using nonskid rugs on floors, and placing mats and/or grab bars in showers.  Be aware of medication side effects. Some common medicines make bones weaker. These include a type of steroid drug called glucocorticoids used for arthritis and asthma, some antiseizure drugs, certain sleeping pills, treatments for endometriosis, and some cancer drugs. An overactive thyroid gland or using too much thyroid hormone for an underactive thyroid can also be a problem. If you are taking these medicines, talk to your doctor about what you can do to help protect your bones.reduce the rate of osteoporosis.    Menopause can be associated with physical symptoms and risks. Hormone replacement therapy is available to decrease symptoms and risks. You should talk to your  health care provider   about whether hormone replacement therapy is right for you.   Use sunscreen. Apply sunscreen liberally and repeatedly throughout the day. You should seek shade when your shadow is shorter than you. Protect yourself by wearing long sleeves, pants, a wide-brimmed hat, and sunglasses year round, whenever you are outdoors.   Once a month, do a whole body skin exam, using a mirror to look at the skin on your back. Tell your health care provider of new moles, moles that have irregular borders, moles that are larger than a pencil eraser, or moles that have changed in shape or color.   -Stay current with required vaccines (immunizations).   Influenza vaccine. All adults should be immunized every year.  Tetanus, diphtheria, and acellular pertussis (Td, Tdap) vaccine. Pregnant women should receive 1 dose of Tdap vaccine during each pregnancy. The dose should be obtained regardless of the length of time since the last dose. Immunization is preferred during the 27th 36th week of gestation. An adult who has not previously received Tdap or who does not know her vaccine status should receive 1 dose of Tdap. This initial dose should be followed by tetanus and diphtheria toxoids (Td) booster doses every 10 years. Adults with an unknown or incomplete history of completing a 3-dose immunization series with Td-containing vaccines should begin or complete a primary immunization series including a Tdap dose. Adults should receive a Td booster every 10 years.  Varicella vaccine. An adult without evidence of immunity to varicella should receive 2 doses or a second dose if she has previously received 1 dose. Pregnant females who do not have evidence of immunity should receive the first dose after pregnancy. This first dose should be obtained before leaving the health care facility. The second dose should be obtained 4 8 weeks after the first dose.  Human papillomavirus (HPV) vaccine. Females aged 13 26  years who have not received the vaccine previously should obtain the 3-dose series. The vaccine is not recommended for use in pregnant females. However, pregnancy testing is not needed before receiving a dose. If a female is found to be pregnant after receiving a dose, no treatment is needed. In that case, the remaining doses should be delayed until after the pregnancy. Immunization is recommended for any person with an immunocompromised condition through the age of 26 years if she did not get any or all doses earlier. During the 3-dose series, the second dose should be obtained 4 8 weeks after the first dose. The third dose should be obtained 24 weeks after the first dose and 16 weeks after the second dose.  Zoster vaccine. One dose is recommended for adults aged 60 years or older unless certain conditions are present.  Measles, mumps, and rubella (MMR) vaccine. Adults born before 1957 generally are considered immune to measles and mumps. Adults born in 1957 or later should have 1 or more doses of MMR vaccine unless there is a contraindication to the vaccine or there is laboratory evidence of immunity to each of the three diseases. A routine second dose of MMR vaccine should be obtained at least 28 days after the first dose for students attending postsecondary schools, health care workers, or international travelers. People who received inactivated measles vaccine or an unknown type of measles vaccine during 1963 1967 should receive 2 doses of MMR vaccine. People who received inactivated mumps vaccine or an unknown type of mumps vaccine before 1979 and are at high risk for mumps infection should consider immunization with 2 doses of   MMR vaccine. For females of childbearing age, rubella immunity should be determined. If there is no evidence of immunity, females who are not pregnant should be vaccinated. If there is no evidence of immunity, females who are pregnant should delay immunization until after pregnancy.  Unvaccinated health care workers born before [birth year] who lack laboratory evidence of measles, mumps, or rubella immunity or laboratory confirmation of disease should consider measles and mumps immunization with 2 doses of MMR vaccine or rubella immunization with 1 dose of MMR vaccine.  Pneumococcal 13-valent conjugate (PCV13) vaccine. When indicated, a person who is uncertain of her immunization history and has no record of immunization should receive the PCV13 vaccine. An adult aged 54 years or older who has certain medical conditions and has not been previously immunized should receive 1 dose of PCV13 vaccine. This PCV13 should be followed with a dose of pneumococcal polysaccharide (PPSV23) vaccine. The PPSV23 vaccine dose should be obtained at least 8 weeks after the dose of PCV13 vaccine. An adult aged 58 years or older who has certain medical conditions and previously received 1 or more doses of PPSV23 vaccine should receive 1 dose of PCV13. The PCV13 vaccine dose should be obtained 1 or more years after the last PPSV23 vaccine dose.  Pneumococcal polysaccharide (PPSV23) vaccine. When PCV13 is also indicated, PCV13 should be obtained first. All adults aged 58 years and older should be immunized. An adult younger than age 65 years who has certain medical conditions should be immunized. Any person who resides in a nursing home or long-term care facility should be immunized. An adult smoker should be immunized. People with an immunocompromised condition and certain other conditions should receive both PCV13 and PPSV23 vaccines. People with human immunodeficiency virus (HIV) infection should be immunized as soon as possible after diagnosis. Immunization during chemotherapy or radiation therapy should be avoided. Routine use of PPSV23 vaccine is not recommended for American Indians, [place] Natives, or people younger than 65 years unless there are medical conditions that require PPSV23 vaccine. When indicated,  people who have unknown immunization and have no record of immunization should receive PPSV23 vaccine. One-time revaccination 5 years after the first dose of PPSV23 is recommended for people aged 70 64 years who have chronic kidney failure, nephrotic syndrome, asplenia, or immunocompromised conditions. People who received 1 2 doses of PPSV23 before age 32 years should receive another dose of PPSV23 vaccine at age 96 years or later if at least 5 years have passed since the previous dose. Doses of PPSV23 are not needed for people immunized with PPSV23 at or after age 55 years.  Meningococcal vaccine. Adults with asplenia or persistent complement component deficiencies should receive 2 doses of quadrivalent meningococcal conjugate (MenACWY-D) vaccine. The doses should be obtained at least 2 months apart. Microbiologists working with certain meningococcal bacteria, [employer] recruits, people at risk during an outbreak, and people who travel to or live in countries with a high rate of meningitis should be immunized. A first-year college student up through age 58 years who is living in a residence hall should receive a dose if she did not receive a dose on or after her 16th birthday. Adults who have certain high-risk conditions should receive one or more doses of vaccine.  Hepatitis A vaccine. Adults who wish to be protected from this disease, have certain high-risk conditions, work with hepatitis A-infected animals, work in hepatitis A research labs, or travel to or work in countries with a high rate of hepatitis A should be  immunized. Adults who were previously unvaccinated and who anticipate close contact with an international adoptee during the first 60 days after arrival in the [place] from a country with a high rate of hepatitis A should be immunized.  Hepatitis B vaccine.  Adults who wish to be protected from this disease, have certain high-risk conditions, may be exposed to blood or other infectious  body fluids, are household contacts or sex partners of hepatitis B positive people, are clients or workers in certain care facilities, or travel to or work in countries with a high rate of hepatitis B should be immunized.  Haemophilus influenzae type b (Hib) vaccine. A previously unvaccinated person with asplenia or sickle cell disease or having a scheduled splenectomy should receive 1 dose of Hib vaccine. Regardless of previous immunization, a recipient of a hematopoietic stem cell transplant should receive a 3-dose series 6 12 months after her successful transplant. Hib vaccine is not recommended for adults with HIV infection.  Preventive Services / Frequency Ages 6 to 39years  Blood pressure check.** / Every 1 to 2 years.  Lipid and cholesterol check.** / Every 5 years beginning at age 39.  Clinical breast exam.** / Every 3 years for women in their 61s and 62s.  BRCA-related cancer risk assessment.** / For women who have family members with a BRCA-related cancer (breast, ovarian, tubal, or peritoneal cancers).  Pap test.** / Every 2 years from ages 47 through 85. Every 3 years starting at age 34 through age 12 or 74 with a history of 3 consecutive normal Pap tests.  HPV screening.** / Every 3 years from ages 46 through ages 43 to 54 with a history of 3 consecutive normal Pap tests.  Hepatitis C blood test.** / For any individual with known risks for hepatitis C.  Skin self-exam. / Monthly.  Influenza vaccine. / Every year.  Tetanus, diphtheria, and acellular pertussis (Tdap, Td) vaccine.** / Consult your health care provider. Pregnant women should receive 1 dose of Tdap vaccine during each pregnancy. 1 dose of Td every 10 years.  Varicella vaccine.** / Consult your health care provider. Pregnant females who do not have evidence of immunity should receive the first dose after pregnancy.  HPV vaccine. / 3 doses over 6 months, if 64 and younger. The vaccine is not recommended for use in  pregnant females. However, pregnancy testing is not needed before receiving a dose.  Measles, mumps, rubella (MMR) vaccine.** / You need at least 1 dose of MMR if you were born in 1957 or later. You may also need a 2nd dose. For females of childbearing age, rubella immunity should be determined. If there is no evidence of immunity, females who are not pregnant should be vaccinated. If there is no evidence of immunity, females who are pregnant should delay immunization until after pregnancy.  Pneumococcal 13-valent conjugate (PCV13) vaccine.** / Consult your health care provider.  Pneumococcal polysaccharide (PPSV23) vaccine.** / 1 to 2 doses if you smoke cigarettes or if you have certain conditions.  Meningococcal vaccine.** / 1 dose if you are age 71 to 36 years and a Market researcher living in a residence hall, or have one of several medical conditions, you need to get vaccinated against meningococcal disease. You may also need additional booster doses.  Hepatitis A vaccine.** / Consult your health care provider.  Hepatitis B vaccine.** / Consult your health care provider.  Haemophilus influenzae type b (Hib) vaccine.** / Consult your health care provider.  Ages 55 to 64years  Blood pressure check.** / Every 1 to 2 years.  Lipid and cholesterol check.** / Every 5 years beginning at age 20 years.  Lung cancer screening. / Every year if you are aged 55 80 years and have a 30-pack-year history of smoking and currently smoke or have quit within the past 15 years. Yearly screening is stopped once you have quit smoking for at least 15 years or develop a health problem that would prevent you from having lung cancer treatment.  Clinical breast exam.** / Every year after age 40 years.  BRCA-related cancer risk assessment.** / For women who have family members with a BRCA-related cancer (breast, ovarian, tubal, or peritoneal cancers).  Mammogram.** / Every year beginning at age 40  years and continuing for as long as you are in good health. Consult with your health care provider.  Pap test.** / Every 3 years starting at age 30 years through age 65 or 70 years with a history of 3 consecutive normal Pap tests.  HPV screening.** / Every 3 years from ages 30 years through ages 65 to 70 years with a history of 3 consecutive normal Pap tests.  Fecal occult blood test (FOBT) of stool. / Every year beginning at age 50 years and continuing until age 75 years. You may not need to do this test if you get a colonoscopy every 10 years.  Flexible sigmoidoscopy or colonoscopy.** / Every 5 years for a flexible sigmoidoscopy or every 10 years for a colonoscopy beginning at age 50 years and continuing until age 75 years.  Hepatitis C blood test.** / For all people born from 1945 through 1965 and any individual with known risks for hepatitis C.  Skin self-exam. / Monthly.  Influenza vaccine. / Every year.  Tetanus, diphtheria, and acellular pertussis (Tdap/Td) vaccine.** / Consult your health care provider. Pregnant women should receive 1 dose of Tdap vaccine during each pregnancy. 1 dose of Td every 10 years.  Varicella vaccine.** / Consult your health care provider. Pregnant females who do not have evidence of immunity should receive the first dose after pregnancy.  Zoster vaccine.** / 1 dose for adults aged 60 years or older.  Measles, mumps, rubella (MMR) vaccine.** / You need at least 1 dose of MMR if you were born in 1957 or later. You may also need a 2nd dose. For females of childbearing age, rubella immunity should be determined. If there is no evidence of immunity, females who are not pregnant should be vaccinated. If there is no evidence of immunity, females who are pregnant should delay immunization until after pregnancy.  Pneumococcal 13-valent conjugate (PCV13) vaccine.** / Consult your health care provider.  Pneumococcal polysaccharide (PPSV23) vaccine.** / 1 to 2 doses if  you smoke cigarettes or if you have certain conditions.  Meningococcal vaccine.** / Consult your health care provider.  Hepatitis A vaccine.** / Consult your health care provider.  Hepatitis B vaccine.** / Consult your health care provider.  Haemophilus influenzae type b (Hib) vaccine.** / Consult your health care provider.  Ages 65 years and over  Blood pressure check.** / Every 1 to 2 years.  Lipid and cholesterol check.** / Every 5 years beginning at age 20 years.  Lung cancer screening. / Every year if you are aged 55 80 years and have a 30-pack-year history of smoking and currently smoke or have quit within the past 15 years. Yearly screening is stopped once you have quit smoking for at least 15 years or develop a health problem that   would prevent you from having lung cancer treatment.  Clinical breast exam.** / Every year after age 103 years.  BRCA-related cancer risk assessment.** / For women who have family members with a BRCA-related cancer (breast, ovarian, tubal, or peritoneal cancers).  Mammogram.** / Every year beginning at age 36 years and continuing for as long as you are in good health. Consult with your health care provider.  Pap test.** / Every 3 years starting at age 5 years through age 85 or 10 years with 3 consecutive normal Pap tests. Testing can be stopped between 65 and 70 years with 3 consecutive normal Pap tests and no abnormal Pap or HPV tests in the past 10 years.  HPV screening.** / Every 3 years from ages 93 years through ages 70 or 45 years with a history of 3 consecutive normal Pap tests. Testing can be stopped between 65 and 70 years with 3 consecutive normal Pap tests and no abnormal Pap or HPV tests in the past 10 years.  Fecal occult blood test (FOBT) of stool. / Every year beginning at age 8 years and continuing until age 45 years. You may not need to do this test if you get a colonoscopy every 10 years.  Flexible sigmoidoscopy or colonoscopy.** /  Every 5 years for a flexible sigmoidoscopy or every 10 years for a colonoscopy beginning at age 69 years and continuing until age 68 years.  Hepatitis C blood test.** / For all people born from [birth year] through 1966 and any individual with known risks for hepatitis C.  Osteoporosis screening.** / A one-time screening for women ages 7 years and over and women at risk for fractures or osteoporosis.  Skin self-exam. / Monthly.  Influenza vaccine. / Every year.  Tetanus, diphtheria, and acellular pertussis (Tdap/Td) vaccine.** / 1 dose of Td every 10 years.  Varicella vaccine.** / Consult your health care provider.  Zoster vaccine.** / 1 dose for adults aged 5 years or older.  Pneumococcal 13-valent conjugate (PCV13) vaccine.** / Consult your health care provider.  Pneumococcal polysaccharide (PPSV23) vaccine.** / 1 dose for all adults aged 74 years and older.  Meningococcal vaccine.** / Consult your health care provider.  Hepatitis A vaccine.** / Consult your health care provider.  Hepatitis B vaccine.** / Consult your health care provider.  Haemophilus influenzae type b (Hib) vaccine.** / Consult your health care provider. ** Family history and personal history of risk and conditions may change your health care provider's recommendations. Document Released: 11/24/2001 Document Revised: 07/19/2013  [facility] Patient Information 2014 [place], [place].   EXERCISE AND DIET:  We recommended that you start or continue a regular exercise program for good health. Regular exercise means any activity that makes your heart beat faster and makes you sweat.  We recommend exercising at least 30 minutes per day at least 3 days a week, preferably 5.  We also recommend a diet low in fat and sugar / carbohydrates.  Inactivity, poor dietary choices and obesity can cause diabetes, heart attack, stroke, and kidney damage, among others.     ALCOHOL AND SMOKING:  Women should limit their alcohol intake to no  more than 7 drinks/beers/glasses of wine (combined, not each!) per week. Moderation of alcohol intake to this level decreases your risk of breast cancer and liver damage.  ( And of course, no recreational drugs are part of a healthy lifestyle.)  Also, you should not be smoking at all or even being exposed to second hand smoke. Most people know smoking can  cause cancer, and various heart and lung diseases, but did you know it also contributes to weakening of your bones?  Aging of your skin?  Yellowing of your teeth and nails?   CALCIUM AND VITAMIN D:  Adequate intake of calcium and Vitamin D are recommended.  The recommendations for exact amounts of these supplements seem to change often, but generally speaking 600 mg of calcium (either carbonate or citrate) and 800 units of Vitamin D per day seems prudent. Certain women may benefit from higher intake of Vitamin D.  If you are among these women, your doctor will have told you during your visit.     PAP SMEARS:  Pap smears, to check for cervical cancer or precancers,  have traditionally been done yearly, although recent scientific advances have shown that most women can have pap smears less often.  However, every woman still should have a physical exam from her gynecologist or primary care physician every year. It will include a breast check, inspection of the vulva and vagina to check for abnormal growths or skin changes, a visual exam of the cervix, and then an exam to evaluate the size and shape of the uterus and ovaries.  And after 61 years of age, a rectal exam is indicated to check for rectal cancers. We will also provide age appropriate advice regarding health maintenance, like when you should have certain vaccines, screening for sexually transmitted diseases, bone density testing, colonoscopy, mammograms, etc.    MAMMOGRAMS:  All women over 71 years old should have a yearly mammogram. Many facilities now offer a "3D" mammogram, which may cost  around $50 extra out of pocket. If possible,  we recommend you accept the option to have the 3D mammogram performed.  It both reduces the number of women who will be called back for extra views which then turn out to be normal, and it is better than the routine mammogram at detecting truly abnormal areas.     COLONOSCOPY:  Colonoscopy to screen for colon cancer is recommended for all women at age 52.  We know, you hate the idea of the prep.  We agree, BUT, having colon cancer and not knowing it is worse!!  Colon cancer so often starts as a polyp that can be seen and removed at colonscopy, which can quite literally save your life!  And if your first colonoscopy is normal and you have no family history of colon cancer, most women don't have to have it again for 10 years.  Once every ten years, you can do something that may end up saving your life, right?  We will be happy to help you get it scheduled when you are ready.  Be sure to check your insurance coverage so you understand how much it will cost.  It may be covered as a preventative service at no cost, but you should check your particular policy.

## 2016-08-24 NOTE — Assessment & Plan Note (Signed)
Dr. Wilhemina Bonito with [facility] dermatology. Patient having 2 lesions removed from scalp.

## 2016-08-28 ENCOUNTER — Encounter: Payer: Self-pay | Admitting: Family Medicine

## 2016-08-28 DIAGNOSIS — Z780 Asymptomatic menopausal state: Secondary | ICD-10-CM | POA: Insufficient documentation

## 2016-08-28 DIAGNOSIS — Z1389 Encounter for screening for other disorder: Secondary | ICD-10-CM | POA: Insufficient documentation

## 2016-08-28 HISTORY — DX: Encounter for screening for other disorder: Z13.89

## 2016-08-31 ENCOUNTER — Encounter: Payer: 59 | Admitting: Family Medicine

## 2016-09-02 ENCOUNTER — Other Ambulatory Visit: Payer: Self-pay | Admitting: Family Medicine

## 2016-09-30 ENCOUNTER — Ambulatory Visit: Payer: 59 | Admitting: Family Medicine

## 2016-10-13 ENCOUNTER — Other Ambulatory Visit: Payer: Self-pay | Admitting: Family Medicine

## 2016-10-13 DIAGNOSIS — I10 Essential (primary) hypertension: Secondary | ICD-10-CM

## 2016-10-13 DIAGNOSIS — F411 Generalized anxiety disorder: Secondary | ICD-10-CM

## 2016-10-16 DIAGNOSIS — L570 Actinic keratosis: Secondary | ICD-10-CM | POA: Diagnosis not present

## 2016-10-30 ENCOUNTER — Other Ambulatory Visit: Payer: 59

## 2016-11-06 ENCOUNTER — Other Ambulatory Visit: Payer: 59

## 2016-12-03 DIAGNOSIS — L570 Actinic keratosis: Secondary | ICD-10-CM | POA: Diagnosis not present

## 2017-01-10 ENCOUNTER — Other Ambulatory Visit: Payer: Self-pay | Admitting: Family Medicine

## 2017-01-10 DIAGNOSIS — I10 Essential (primary) hypertension: Secondary | ICD-10-CM

## 2017-01-10 DIAGNOSIS — F411 Generalized anxiety disorder: Secondary | ICD-10-CM

## 2017-01-11 MED ORDER — ESCITALOPRAM OXALATE 10 MG PO TABS: 10.0000 mg | ORAL_TABLET | Freq: Every day | ORAL | 0 refills | Status: DC

## 2017-01-11 MED ORDER — LISINOPRIL 10 MG PO TABS: 10.0000 mg | ORAL_TABLET | Freq: Every day | ORAL | 0 refills | Status: DC

## 2017-01-18 ENCOUNTER — Encounter: Payer: Self-pay | Admitting: Cardiovascular Disease

## 2017-01-18 ENCOUNTER — Other Ambulatory Visit: Payer: Self-pay

## 2017-01-18 DIAGNOSIS — I10 Essential (primary) hypertension: Secondary | ICD-10-CM

## 2017-01-18 DIAGNOSIS — F411 Generalized anxiety disorder: Secondary | ICD-10-CM

## 2017-01-18 DIAGNOSIS — E785 Hyperlipidemia, unspecified: Secondary | ICD-10-CM

## 2017-01-18 MED ORDER — ESCITALOPRAM OXALATE 10 MG PO TABS: 10.0000 mg | ORAL_TABLET | Freq: Every day | ORAL | 0 refills | Status: DC

## 2017-01-18 MED ORDER — LISINOPRIL 10 MG PO TABS: 10.0000 mg | ORAL_TABLET | Freq: Every day | ORAL | 0 refills | Status: DC

## 2017-01-18 NOTE — Telephone Encounter (Signed)
Received MyChart message from pt stating that she no longer uses [facility] and needs her RXs for escitalopram and lisinopril sent to [facility].  RXs resent.  Charyl Bigger, CMA

## 2017-01-19 ENCOUNTER — Other Ambulatory Visit: Payer: Self-pay

## 2017-01-19 DIAGNOSIS — I10 Essential (primary) hypertension: Secondary | ICD-10-CM

## 2017-01-19 DIAGNOSIS — F411 Generalized anxiety disorder: Secondary | ICD-10-CM

## 2017-01-19 MED ORDER — LISINOPRIL 10 MG PO TABS: 10.0000 mg | ORAL_TABLET | Freq: Every day | ORAL | 0 refills | Status: DC

## 2017-01-19 MED ORDER — ESCITALOPRAM OXALATE 10 MG PO TABS: 10.0000 mg | ORAL_TABLET | Freq: Every day | ORAL | 0 refills | Status: DC

## 2017-01-19 NOTE — Progress Notes (Signed)
Received MyChart message from patient stating that [facility] did not receive her refills for escitalopram and lisinopril.  After reviewing chart, medications were sent to [facility].  Medication refills sent to [facility].  Charyl Bigger, CMA

## 2017-01-20 ENCOUNTER — Ambulatory Visit: Payer: 59 | Admitting: Primary Care

## 2017-01-26 DIAGNOSIS — H0289 Other specified disorders of eyelid: Secondary | ICD-10-CM | POA: Diagnosis not present

## 2017-01-26 DIAGNOSIS — H11442 Conjunctival cysts, left eye: Secondary | ICD-10-CM | POA: Diagnosis not present

## 2017-02-09 ENCOUNTER — Telehealth: Payer: Self-pay | Admitting: Family Medicine

## 2017-02-09 NOTE — Telephone Encounter (Signed)
Left message asking pt to call office RE: request for appointment

## 2017-04-05 ENCOUNTER — Other Ambulatory Visit: Payer: Self-pay | Admitting: *Deleted

## 2017-04-05 DIAGNOSIS — E782 Mixed hyperlipidemia: Secondary | ICD-10-CM

## 2017-04-05 LAB — LIPID PANEL
Chol/HDL Ratio: 4.6 ratio — ABNORMAL HIGH (ref 0.0–4.4)
Cholesterol, Total: 273 mg/dL — ABNORMAL HIGH (ref 100–199)
HDL: 60 mg/dL (ref >39)
LDL Calculated: 201 mg/dL — ABNORMAL HIGH (ref 0–99)
Triglycerides: 60 mg/dL (ref 0–149)
VLDL Cholesterol Cal: 12 mg/dL (ref 5–40)

## 2017-04-05 NOTE — Addendum Note (Signed)
Addended by: Eulis Foster on: 04/05/2017 08:23 AM   Modules accepted: Orders

## 2017-04-06 ENCOUNTER — Telehealth: Payer: Self-pay | Admitting: Pharmacist

## 2017-04-06 MED ORDER — ROSUVASTATIN CALCIUM 5 MG PO TABS: ORAL_TABLET | ORAL | 1 refills | Status: DC

## 2017-04-06 NOTE — Telephone Encounter (Signed)
Spoke with pt about most recent lipid results. She is willing to try Crestor 5mg  once weekly. She is seeing Dr. [name] on Friday and will pick up prescription to start then. Will plan to recheck lipids in 8 weeks.

## 2017-04-08 ENCOUNTER — Other Ambulatory Visit: Payer: Self-pay

## 2017-04-08 NOTE — Progress Notes (Signed)
Patient ID: Samantha Stark, female   DOB: 09-19-1956, 61 y.o.   MRN: [id number]   61 y.o.  with history of benign palpitations. 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 Seen in 2013 after travel to   [place] had 2 nights of rapid pounding and palpitations. Does [employer] with no issues. No syncope chest pain or dyspnea. Notes lack of sleep and caffeine make palpitations more likely . No history of thyroid disease. Has not had bad episode since returning from travel. Palpitaitons are rapid skips and flip flops  F/U event monitor benign Thyroid studies have been normal   No presyncope Compliant with BP meds including beta blocker   Traveling to [place] now for [employer]   6 grandkids  Two here and 4 in [place] to most statins Recently started on crestor once weekly  Having some chest tightness with exertion Last 8 weeks Stress at work Going to [place] [address] in [place]  Lab Results  Component Value Date   [place] 201 (H) 04/05/2017    ROS: Depressed and palpitations all other systems reviewed and negative   General: BP 130/82   Pulse 73   Ht 5\' 2"  (1.575 m)   Wt 73.4 kg (161 lb 12.8 oz)   SpO2 96%   BMI 29.59 kg/m  Affect appropriate Healthy:  appears stated age 44: normal Neck supple with no adenopathy JVP normal no bruits no thyromegaly Lungs clear with no wheezing and good diaphragmatic motion Heart:  S1/S2 no murmur, no rub, gallop or click PMI normal Abdomen: benighn, BS positve, no tenderness, no AAA no bruit.  No HSM or HJR Distal pulses intact with no bruits No edema Neuro non-focal Skin warm and dry No muscular weakness    Current Outpatient Prescriptions  Medication Sig Dispense Refill  . cyclobenzaprine (FLEXERIL) 5 MG tablet Take 5-10 mg by mouth 3 (three) times daily as needed for muscle spasms. Reported on 03/04/2016    . EPINEPHrine 0.3 mg/0.3  mL IJ SOAJ injection Inject 0.3 mg into the muscle as directed. Reported on 03/04/2016    . escitalopram (LEXAPRO) 10 MG tablet Take 1 tablet (10 mg total) by mouth daily. 90 tablet 0  . lisinopril (PRINIVIL,ZESTRIL) 10 MG tablet Take 1 tablet (10 mg total) by mouth daily. 90 tablet 0  . Multiple Vitamin (MULTIVITAMIN) tablet Take 1 tablet by mouth daily.    . Nutritional Supplements (JUICE PLUS FIBRE PO) Take by mouth daily.    . propranolol (INDERAL) 10 MG tablet Take one tablet by mouth as needed for palpitations up to 3 per day and at least 6 hours apart. 90 tablet 1  . rosuvastatin (CRESTOR) 5 MG tablet Take 1 tablet by mouth once weekly, increase weekly dose as tolerated. 12 tablet 1  . spironolactone (ALDACTONE) 25 MG tablet Take 25 mg by mouth daily as needed (fluid).    Marland Kitchen aspirin EC 81 MG tablet Take 1 tablet (81 mg total) by mouth daily. 90 tablet 3  . metoprolol succinate (TOPROL-XL) 50 MG 24 hr tablet Take 1 tablet (50 mg total) by mouth daily. Take with or immediately following a meal. 90 tablet 3   No current facility-administered medications for this visit.     Allergies  Iodine; Shrimp [shellfish allergy]; Amlodipine besylate; Calcium channel blockers; Cephalexin; Codeine; Coricidin d cold-flu-sinus [chlorphen-pe-acetaminophen]; Erythromycin; Hydrochlorothiazide w-triamterene; Morphine; Phenylephrine hcl; and Statins  Electrocardiogram:  05/12/13  SR rate 61 normal  10/26/14  SR rate 69 normal   03/02/16  SR ate 73 normal  04/09/17  SR rate 73 normal   Assessment and Plan  Palpitations : benign infrequent observe HTN: Well controlled.  Continue current medications and low sodium Dash type diet.   Elevated Liipids  Has had leg pain with lipitor, pravachol and one other statin On crestor once weekly  Now f/u labs in 3 months  Chest Pain:  ECG normal f/u ETT             [employer]

## 2017-04-09 ENCOUNTER — Ambulatory Visit (INDEPENDENT_AMBULATORY_CARE_PROVIDER_SITE_OTHER): Payer: BLUE CROSS/BLUE SHIELD | Admitting: Cardiovascular Disease

## 2017-04-09 ENCOUNTER — Encounter: Payer: Self-pay | Admitting: Cardiovascular Disease

## 2017-04-09 VITALS — BP 130/82 | HR 73 | Ht 62.0 in | Wt 161.8 lb

## 2017-04-09 DIAGNOSIS — I10 Essential (primary) hypertension: Secondary | ICD-10-CM

## 2017-04-09 DIAGNOSIS — R0789 Other chest pain: Secondary | ICD-10-CM

## 2017-04-09 DIAGNOSIS — Z79899 Other long term (current) drug therapy: Secondary | ICD-10-CM

## 2017-04-09 MED ORDER — METOPROLOL SUCCINATE ER 50 MG PO TB24: 50.0000 mg | ORAL_TABLET | Freq: Every day | ORAL | 3 refills | Status: DC

## 2017-04-09 MED ORDER — METOPROLOL SUCCINATE ER 25 MG PO TB24: 25.0000 mg | ORAL_TABLET | Freq: Every day | ORAL | 3 refills | Status: DC

## 2017-04-09 MED ORDER — FUROSEMIDE 20 MG PO TABS: 20.0000 mg | ORAL_TABLET | Freq: Every day | ORAL | 3 refills | Status: DC

## 2017-04-09 MED ORDER — ASPIRIN EC 81 MG PO TBEC: 81.0000 mg | DELAYED_RELEASE_TABLET | Freq: Every day | ORAL | 3 refills | Status: AC

## 2017-04-09 NOTE — Patient Instructions (Addendum)
Medication Instructions:  Your physician recommends that you continue on your current medications as directed. Please refer to the Current Medication list given to you today.  Labwork: NONE  Testing/Procedures: Your physician has requested that you have an exercise tolerance test. For further information please visit [email or web address]. Please also follow instruction sheet, as given.  Follow-Up: Your physician wants you to follow-up in: 3 months with the [facility] physician wants you to follow-up in: 1 year with Dr. [name]. You will receive a reminder letter in the mail two months in advance. If you don't receive a letter, please call our office to schedule the follow-up appointment.   If you need a refill on your cardiac medications before your next appointment, please call your pharmacy.

## 2017-04-22 ENCOUNTER — Other Ambulatory Visit: Payer: Self-pay

## 2017-04-22 ENCOUNTER — Encounter: Payer: Self-pay | Admitting: Cardiovascular Disease

## 2017-04-22 DIAGNOSIS — I10 Essential (primary) hypertension: Secondary | ICD-10-CM

## 2017-04-22 MED ORDER — LISINOPRIL 20 MG PO TABS: 20.0000 mg | ORAL_TABLET | Freq: Every day | ORAL | 11 refills | Status: DC

## 2017-04-22 NOTE — Telephone Encounter (Signed)
Per Dr. [name], have her increase lisinopril to 20 mg for now call in new script.

## 2017-04-26 ENCOUNTER — Ambulatory Visit (INDEPENDENT_AMBULATORY_CARE_PROVIDER_SITE_OTHER): Payer: BLUE CROSS/BLUE SHIELD | Admitting: Family Medicine

## 2017-04-26 ENCOUNTER — Encounter: Payer: Self-pay | Admitting: Family Medicine

## 2017-04-26 VITALS — BP 134/83 | HR 69 | Ht 62.0 in | Wt 158.0 lb

## 2017-04-26 DIAGNOSIS — H6501 Acute serous otitis media, right ear: Secondary | ICD-10-CM

## 2017-04-26 DIAGNOSIS — H9193 Unspecified hearing loss, bilateral: Secondary | ICD-10-CM | POA: Diagnosis not present

## 2017-04-26 DIAGNOSIS — H60501 Unspecified acute noninfective otitis externa, right ear: Secondary | ICD-10-CM | POA: Diagnosis not present

## 2017-04-26 DIAGNOSIS — J0100 Acute maxillary sinusitis, unspecified: Secondary | ICD-10-CM | POA: Diagnosis not present

## 2017-04-26 MED ORDER — AMOXICILLIN 875 MG PO TABS: 875.0000 mg | ORAL_TABLET | Freq: Two times a day (BID) | ORAL | 0 refills | Status: DC

## 2017-04-26 MED ORDER — ACETIC ACID 2 % OT SOLN: 4.0000 [drp] | Freq: Four times a day (QID) | OTIC | 0 refills | Status: AC

## 2017-04-26 NOTE — Progress Notes (Signed)
Pt here for an acute care OV today   Impression and Recommendations:    1. Right acute serous otitis media, recurrence not specified   2. Acute maxillary sinusitis, recurrence not specified   3. Acute otitis externa of right ear, unspecified type   4. Decreased hearing of both ears    - Vosol solution for right otitis externa with possible fungal elements.  Also antibiotic oral given for OM right with possible sinusitis. - Since patient flies for work and will be flying out this Friday, she will come in for recheck of her ear on Thursday to make sure it is improving. - Ear care discussed with patient.  Indication: Cerumen impaction of the ear(s) B/L * 2 Medical necessity statement:  On physical examination, cerumen impairs clinically significant portions of the external auditory canal, and tympanic membrane.  Noted obstructive, copious cerumen that cannot be removed without magnification and instrumentations requiring physician skills Consent:  Discussed benefits and risks of procedure and verbal consent obtained Procedure:   Patient was prepped for the procedure.  Utilized an otoscope to assess and take note of the ear canal, the tympanic membrane, and the presence, amount, and placement of the cerumen. Gentle water irrigation and soft plastic curette was utilized to remove cerumen. Post procedure examination:  shows cerumen was removed, without trauma or injury to the ear canal or TM, which remains intact.   Post-Procedural Ear Care Instructions:    Patient tolerated procedure well.  Proper ear care d/c pt.   The patient is made aware that they may experience temporary vertigo, temporary hearing loss, and temporary discomfort.  If these symptom last for more than 24 hours to call the clinic or proceed to the ED/Urgent Care.   The patient was counseled, risk factors were discussed, anticipatory guidance given.  New Prescriptions   ACETIC ACID (VOSOL) 2 % OTIC SOLUTION    Place 4 drops  into the right ear 4 (four) times daily.   AMOXICILLIN (AMOXIL) 875 MG TABLET    Take 1 tablet (875 mg total) by mouth 2 (two) times daily.   Return if symptoms worsen or fail to improve, for Please follow up for chronic care as well.  Gross side effects, risk and benefits, and alternatives of medications and treatment plan in general discussed with patient.  Patient is aware that all medications have potential side effects and we are unable to predict every side effect or drug-drug interaction that may occur.   Patient will call with any questions prior to using medication if they have concerns.  Expresses verbal understanding and consents to current therapy and treatment regimen.  No barriers to understanding were identified.  Red flag symptoms and signs discussed in detail.  Patient expressed understanding regarding what to do in case of emergency\urgent symptoms  Please see AVS handed out to patient at the end of our visit for further patient instructions/ counseling done pertaining to today's office visit.   Return if symptoms worsen or fail to improve, for Please follow up for chronic care as well.     Note: This document was prepared using Dragon voice recognition software and may include unintentional dictation errors.  Devarius Nelles 3:00 PM --------------------------------------------------------------------------------------------------------------------------------------------------------------------------------------------------------------------------------------------    Subjective:    CC:  Chief Complaint  Patient presents with  . Ear Pain    right ear pain x 3 days and headache    HPI: Samantha Stark is a 61 y.o. female who presents to [facility] today for issues as discussed below.  R ear pain and R sided face pain-  Started 3 days ago.  Flushing ear once daily with warm water.  Tea tree oil and coconut oil couple drops daily -  helping a little for pain but still there. No F/C,  Teeth on R side face hurt today now.  Tylenol prn pain.   No problems updated.   Wt Readings from Last 3 Encounters:  04/26/17 158 lb (71.7 kg)  04/09/17 161 lb 12.8 oz (73.4 kg)  08/24/16 164 lb (74.4 kg)   BP Readings from Last 3 Encounters:  04/26/17 134/83  04/09/17 130/82  08/24/16 (!) 144/85   BMI Readings from Last 3 Encounters:  04/26/17 28.90 kg/m  04/09/17 29.59 kg/m  08/24/16 30.00 kg/m     Patient Care Team    Relationship Specialty Notifications Start End  Mellody Dance, DO PCP - General Family Medicine  04/26/17   Josue Hector, MD Consulting Physician Cardiology  06/05/16   Jacelyn Pi, MD Consulting Physician Endocrinology  06/05/16   Einar Grad, DC Referring Physician Chiropractic Medicine  06/05/16      Patient Active Problem List   Diagnosis Date Noted  . Adjustment disorder  06/05/2016    Priority: High  . Essential hypertension 01/16/2008    Priority: High  . HYPERLIPIDEMIA- mgt per Cards 10/04/2007    Priority: High  . Overweight (BMI 25.0-29.9) 06/05/2016    Priority: Medium  . Benign heart Palpitations 06/29/2012    Priority: Medium  . Peripheral edema 06/05/2016    Priority: Low  . Osteopenia 03/15/2012    Priority: Low  . Vitamin D deficiency 05/06/2010    Priority: Low  . h/o HYPERCALCEMIA 05/06/2010    Priority: Low  . Diverticulosis of large intestine 07/18/2008    Priority: Low  . Postmenopausal 08/28/2016  . Screening for multiple conditions 08/28/2016  . Actinic keratoses- scalp 08/24/2016  . Seasonal allergies 06/27/2016  . Encounter for counseling for care management of patient with chronic conditions and complex health needs using nurse-based model 06/27/2016  . Counseling on health promotion and disease prevention 06/27/2016  . Acute cystitis 01/28/2016  . Non-toxic multinodular goiter 07/05/2013  . Pleural effusion 06/10/2013  . 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 Generalized anxiety disorder 07/18/2008    Past Medical history, Surgical history, Family history, Social history, Allergies and Medications have been entered into the medical record, reviewed and changed as needed.    Current Meds  Medication Sig  . aspirin EC 81 MG tablet Take 1 tablet (81 mg total) by mouth daily.  . cyclobenzaprine (FLEXERIL) 5 MG tablet Take 5-10 mg by mouth 3 (three) times daily as needed for muscle spasms. Reported on 03/04/2016  . EPINEPHrine 0.3 mg/0.3 mL IJ SOAJ injection Inject 0.3 mg into the muscle as directed. Reported on 03/04/2016  . escitalopram (LEXAPRO) 10 MG tablet Take 1 tablet (10 mg total) by mouth daily.  Marland Kitchen lisinopril (PRINIVIL,ZESTRIL) 20 MG tablet Take 1 tablet (20 mg total) by mouth daily.  . metoprolol succinate (TOPROL-XL) 25 MG 24 hr tablet Take 1 tablet (25 mg total) by mouth daily.  . Multiple Vitamin (MULTIVITAMIN) tablet Take 1 tablet by mouth daily.  . Nutritional Supplements (JUICE PLUS FIBRE PO) Take by mouth daily.  . propranolol (INDERAL) 10 MG tablet Take one tablet by mouth as needed for palpitations up to 3 per day and at least 6 hours apart.  Marland Kitchen  rosuvastatin (CRESTOR) 5 MG tablet Take 1 tablet by mouth once weekly, increase weekly dose as tolerated.  Marland Kitchen spironolactone (ALDACTONE) 25 MG tablet Take 25 mg by mouth daily as needed (fluid).  . [DISCONTINUED] lisinopril (PRINIVIL,ZESTRIL) 10 MG tablet     Allergies:  Allergies  Allergen Reactions  . Iodine Anaphylaxis  . Shrimp [Shellfish Allergy]     Angioedema caused by shrimp as well as oysters. EpiPen current  . Amlodipine Besylate     REACTION: edema, fatigue  . Calcium Channel Blockers     REACTION: edema , fatigue  . Cephalexin     REACTION: diarrhea  . Codeine     REACTION: nausea  . Coricidin D Cold-Flu-Sinus [Chlorphen-Pe-Acetaminophen] Hives  . Erythromycin Diarrhea  . Hydrochlorothiazide W-Triamterene     REACTION: low potassium  .  Morphine Nausea And Vomiting  . Phenylephrine Hcl Hives  . Statins     Leg cramps with Lipitor & Pravachol     Review of Systems: General:   Denies fever, chills, unexplained weight loss.  Optho/Auditory:   Denies visual changes, blurred vision/LOV Respiratory:   Denies wheeze, DOE more than baseline levels.  Cardiovascular:   Denies chest pain, palpitations, new onset peripheral edema  Gastrointestinal:   Denies nausea, vomiting, diarrhea, abd pain.  Genitourinary: Denies dysuria, freq/ urgency, flank pain or discharge from genitals.  Endocrine:     Denies hot or cold intolerance, polyuria, polydipsia. Musculoskeletal:   Denies unexplained myalgias, joint swelling, unexplained arthralgias, gait problems.  Skin:  Denies new onset rash, suspicious lesions Neurological:     Denies dizziness, unexplained weakness, numbness  Psychiatric/Behavioral:   Denies mood changes, suicidal or homicidal ideations, hallucinations    Objective:   Blood pressure 134/83, pulse 69, height 5\' 2"  (1.575 m), weight 158 lb (71.7 kg). Body mass index is 28.9 kg/m. General:  Well Developed, well nourished, appropriate for stated age.  Neuro:  Alert and oriented,  extra-ocular muscles intact  HEENT:  Normocephalic, atraumatic, neck supple, Right TM completely blocked with copious amount cerumen, hearing dec to soft spoken words.   I used a curette to remove it myself.  This took repeated attempts.  After removal, R TM was opaque and retracted; whitish exudate over TM is well; inflamed and erythematous external auditory canal on the right.   Left TM: Obscured by cerumen and decreased hearing.  Cerumen Hard and irrigation followed by me using curette to remove cerumen in its entirety done times multiple times.  Once removed left TM slight retraction, no erythema or effusion present.  Oropharynx-clear, some tenderness to palpation right maxillary sinus otherwise normal, no lymphadenopathy. Skin:  no gross rash,  warm, pink. Cardiac:  RRR, S1 S2 Respiratory:  ECTA B/L and A/P, Not using accessory muscles, speaking in full sentences- unlabored. Vascular:  Ext warm, no cyanosis apprec.; cap RF less 2 sec. Psych:  No HI/SI, judgement and insight good, Euthymic mood. Full Affect.

## 2017-04-26 NOTE — Patient Instructions (Signed)
Take 1 spray each nostril twice daily for 2 wks until congestion cleared.    Just in case she develop any room spinning type symptoms-  How to Treat Vertigo at Home with Exercises  What is Vertigo?  Vertigo is a relatively common symptom most often associated with conditions such as sinusitis (inflammation of your sinuses due to viruses, allergies, or bacterial infections), or an inner ear infection or ear trauma.   It can be brought on by trauma (e.g. a blow to the head or whiplash) or more serious things like minor strokes.   Symptoms can also be brought on by normal degenerative changes to your inner ear that occur with aging.  The condition tends to be more commonly seen in the elderly but it can occur in all ages.    Patients most often complain of dizziness, as if the room is spinning around them.   Symptoms are provoked by quick head movements or changes in position like going from standing to lying in bed, or even turning over in bed.   It may present with nausea and/or vomiting, and can be very debilitating to some folks.    By far the most common cause, known as Benign Paroxysmal Positional Vertigo (BPPV), is categorized by a sudden onset of symptoms, that are intense but short-lived (60 seconds or less), which is triggered by a change in head position.   Symptoms usually dissipate if you stay in one position and do not move your head.   Within the inner ear are collections of calcium carbonate crystals referred to as "otoliths" which may become dislodged from their normal position and migrate into the semicircular canals of the inner ear, throwing off your body's ability to sense where you are in space.     Fig. 921 Anatomy of the Right Osseous Labyrinth. Antonieta Iba. Anatomy of the Human Body. 1918.            What Else Could Be Behind My Vertigo?  Some other causes of vertigo include:  Meniere's disease (disorder of inner ear with ringing in ears, feeling of fullness/pressure  within ear, and fluctuating hearing loss) Tumours Neurological disorders e.g. Multiple Sclerosis Motion Sickness (lack of coordination between visual stimuli, inner ear balance and positional sense) Migraine Labyrinthitis (inflammation of the fluid-filled tubes and sacs within the inner ear; may also be associated with changes in hearing) Vestibular neuritis (inflammation of the nerves associated with transmission of sensory info from the inner ear; usually of viral origins)  How it can be treated/cured? While certain medications have been prescribed for vertigo including Lorazepam your doing well 7 house the house going organizing and getting things ready for sale with the and Meclizine (for motion sickness), there exists no evidence to support a recommendation of any medication in the routine treatment of BPPV.  Clinical trials have demonstrated that repositioning techniques (listed below) are a superior option for management Otis Dials et al., 2008).    Figure above:  (A) Instructions for the modified Epley procedure (MEP) for left ear posterior canal benign paroxysmal positional vertigo (PC-BPPV). For right ear BPPV, the procedure has to be performed in the opposite direction, starting with the head turned to the right side.  1. Start by sitting on a bed with your head turned 45 to the left. Place a pillow behind you so that on lying back it will be under your shoulders.  2. Lie back quickly with shoulders on the pillow, neck extended, and head resting on the  bed. In this position, the affected (left) ear is underneath. Wait for 30 secondS.  3. Turn your head 90 to the right (without raising it), and wait again for 30 seconds.  4. Turn your body and head another 90 to the right, and wait for another 30 seconds.  5. Sit up on the right side. This maneuver should be performed three times a day. Repeat this daily until you are free from positional vertigo for 24 hours.   (B) Instructions for the  modified Semont maneuver (MSM) for left ear PC-BPPV. For right ear BPPV, the maneuver has to be performed in the opposite direction, starting with the head turned toward the left ear.  1. Sit upright on a bed with your head turned 45 toward the right ear.  2. Drop quickly to the left side, so that your head touches the bed behind your left ear. Wait 30 seconds.  3. Move head and trunk in a swift movement toward the other side without stopping in the upright position, so that your head comes to rest on the right side of your forehead. Wait again for 30 seconds.  4. Sit up again.  This maneuver should be performed three times a day. Repeat this daily until you are free from positional vertigo symptoms for 24 hours.   (   See the video in the supplementary material on the NeurologyWeb site; go to http://www.neurology.org/content/63/1/150/F1.expansion.html   )     You can also try this motion at home as well- Self-Treatment of Benign Paroxysmal Positional Vertigo Benign Paroxysmal Positioning Vertigo is caused by loose inner ear crystals in the inner ear that migrate while sleeping to the back-bottom inner ear balance canal, the so-called "posterior semi-circular canal." The maneuver demonstrated below is the way to reposition the loose crystals so that the symptoms caused by the loose crystals go away. You may have a floating, swaying sense while walking or sitting for a few days after this procedure.

## 2017-04-29 ENCOUNTER — Ambulatory Visit (INDEPENDENT_AMBULATORY_CARE_PROVIDER_SITE_OTHER): Payer: BLUE CROSS/BLUE SHIELD | Admitting: Family Medicine

## 2017-04-29 ENCOUNTER — Encounter: Payer: Self-pay | Admitting: Family Medicine

## 2017-04-29 VITALS — BP 122/81 | HR 71 | Ht 62.0 in | Wt 158.0 lb

## 2017-04-29 DIAGNOSIS — H9201 Otalgia, right ear: Secondary | ICD-10-CM

## 2017-04-29 DIAGNOSIS — H6501 Acute serous otitis media, right ear: Secondary | ICD-10-CM

## 2017-04-29 DIAGNOSIS — J0100 Acute maxillary sinusitis, unspecified: Secondary | ICD-10-CM

## 2017-04-29 MED ORDER — HYDROCODONE-IBUPROFEN 5-200 MG PO TABS: 1.0000 | ORAL_TABLET | Freq: Three times a day (TID) | ORAL | 0 refills | Status: DC | PRN

## 2017-04-29 NOTE — Patient Instructions (Signed)
Please use the pain medicine only as needed for sere pain. With flying try to use those year pressurize her earplugs

## 2017-04-29 NOTE — Progress Notes (Signed)
Pt here for an acute care OV today   Impression and Recommendations:    No diagnosis found. - COnt Vosol solution for right otitis externa with possible fungal elements-  Looks actually improved from prior.  No longer with whitish discharge and exudate..  Also antibiotic oral given for OM right with possible sinusitis.   Patient wishes to hold off n oral prednisone and ll continue her Flonase. - Since patient flies for work and will be flying out this Friday, she will come in for recheck of her ear on Thursday to make sure it is improving.   Please continue to take the Flonae d ave patientain medicines to use as needed. - Ear care discussed with patient    The patient was counseled, risk factors were discussed, anticipatory guidance given.  New Prescriptions   No medications on file   No Follow-up on file.  Gross side effects, risk and benefits, and alternatives of medications and treatment plan in general discussed with patient.  Patient is aware that all medications have potential side effects and we are unable to predict every side effect or drug-drug interaction that may occur.   Patient will call with any questions prior to using medication if they have concerns.  Expresses verbal understanding and consents to current therapy and treatment regimen.  No barriers to understanding were identified.  Red flag symptoms and signs discussed in detail.  Patient expressed understanding regarding what to do in case of emergency\urgent symptoms  Please see AVS handed out to patient at the end of our visit for further patient instructions/ counseling done pertaining to today's office visit.   No Follow-up on file.     Note: This document was prepared using Dragon voice recognition software and may include unintentional dictation errors.  Mellody Dance 2:31  PM --------------------------------------------------------------------------------------------------------------------------------------------------------------------------------------------------------------------------------------------    Subjective:    CC:  Chief Complaint  Patient presents with  . Ear Pain    follow up     HPI: Samantha Stark is a 61 y.o. female who presents to [place] today for issues as discussed below.  R ear pain and R sided face pain-  Started 3 days ago.  Flushing ear once daily with warm water.  Tea tree oil and coconut oil couple drops daily - helping a little for pain but still there. No F/C,  Teeth on R side face hurt today now.  Tylenol prn pain.   TODAY--->   Ear 30% improvement.  Taking asa for pain.  Still with facial and ear pain- esp in front of ear.    Patient states that her eardrops didn't coe in until the ext day.   Pain at night is waking her up.    Lastly sh woke at 4 AM nd cud not go bac to sleep even aftr taking aspirin.  Tylenol or iuofen does no toch t.  No problems updated.   Wt Readings from Last 3 Encounters:  04/29/17 158 lb (71.7 kg)  04/26/17 158 lb (71.7 kg)  04/09/17 161 lb 12.8 oz (73.4 kg)   BP Readings from Last 3 Encounters:  04/29/17 122/81  04/26/17 134/83  04/09/17 130/82   BMI Readings from Last 3 Encounters:  04/29/17 28.90 kg/m  04/26/17 28.90 kg/m  04/09/17 29.59 kg/m     Patient Care Team    Relationship Specialty Notifications Start End  Mellody Dance, DO PCP - General Family Medicine  04/26/17   Josue Hector,  MD Consulting Physician Cardiology  06/05/16   Jacelyn Pi, MD Consulting Physician Endocrinology  06/05/16   Einar Grad, DC Referring Physician Chiropractic Medicine  06/05/16      Patient Active Problem List   Diagnosis Date Noted  . Adjustment disorder  06/05/2016    Priority: High  . Essential hypertension 01/16/2008    Priority: High   . HYPERLIPIDEMIA- mgt per Cards 10/04/2007    Priority: High  . Overweight (BMI 25.0-29.9) 06/05/2016    Priority: Medium  . Benign heart Palpitations 06/29/2012    Priority: Medium  . Peripheral edema 06/05/2016    Priority: Low  . Osteopenia 03/15/2012    Priority: Low  . Vitamin D deficiency 05/06/2010    Priority: Low  . h/o HYPERCALCEMIA 05/06/2010    Priority: Low  . Diverticulosis of large intestine 07/18/2008    Priority: Low  . Postmenopausal 08/28/2016  . Screening for multiple conditions 08/28/2016  . Actinic keratoses- scalp 08/24/2016  . Seasonal allergies 06/27/2016  . Encounter for counseling for care management of patient with chronic conditions and complex health needs using nurse-based model 06/27/2016  . Counseling on health promotion and disease prevention 06/27/2016  . Acute cystitis 01/28/2016  . Non-toxic multinodular goiter 07/05/2013  . Pleural effusion 06/10/2013  . 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 Generalized anxiety disorder 07/18/2008    Past Medical history, Surgical history, Family history, Social history, Allergies and Medications have been entered into the medical record, reviewed and changed as needed.    Current Meds  Medication Sig  . acetic acid (VOSOL) 2 % otic solution Place 4 drops into the right ear 4 (four) times daily.  Marland Kitchen amoxicillin (AMOXIL) 875 MG tablet Take 1 tablet (875 mg total) by mouth 2 (two) times daily.  Marland Kitchen aspirin EC 81 MG tablet Take 1 tablet (81 mg total) by mouth daily.  . cyclobenzaprine (FLEXERIL) 5 MG tablet Take 5-10 mg by mouth 3 (three) times daily as needed for muscle spasms. Reported on 03/04/2016  . EPINEPHrine 0.3 mg/0.3 mL IJ SOAJ injection Inject 0.3 mg into the muscle as directed. Reported on 03/04/2016  . escitalopram (LEXAPRO) 10 MG tablet Take 1 tablet (10 mg total) by mouth daily.  Marland Kitchen lisinopril (PRINIVIL,ZESTRIL) 20 MG tablet Take 1 tablet (20 mg total) by mouth daily.  . metoprolol  succinate (TOPROL-XL) 25 MG 24 hr tablet Take 1 tablet (25 mg total) by mouth daily.  . Multiple Vitamin (MULTIVITAMIN) tablet Take 1 tablet by mouth daily.  . Nutritional Supplements (JUICE PLUS FIBRE PO) Take by mouth daily.  . propranolol (INDERAL) 10 MG tablet Take one tablet by mouth as needed for palpitations up to 3 per day and at least 6 hours apart.  . rosuvastatin (CRESTOR) 5 MG tablet Take 1 tablet by mouth once weekly, increase weekly dose as tolerated.  Marland Kitchen spironolactone (ALDACTONE) 25 MG tablet Take 25 mg by mouth daily as needed (fluid).    Allergies:  Allergies  Allergen Reactions  . Iodine Anaphylaxis  . Shrimp [Shellfish Allergy]     Angioedema caused by shrimp as well as oysters. EpiPen current  . Amlodipine Besylate     REACTION: edema, fatigue  . Calcium Channel Blockers     REACTION: edema , fatigue  . Cephalexin     REACTION: diarrhea  . Codeine     REACTION: nausea  . Coricidin D Cold-Flu-Sinus [Chlorphen-Pe-Acetaminophen] Hives  . Erythromycin Diarrhea  . Hydrochlorothiazide W-Triamterene     REACTION: low  potassium  . Morphine Nausea And Vomiting  . Phenylephrine Hcl Hives  . Statins     Leg cramps with Lipitor & Pravachol     Review of Systems: General:   Denies fever, chills, unexplained weight loss.  Optho/Auditory:   Denies visual changes, blurred vision/LOV Respiratory:   Denies wheeze, DOE more than baseline levels.  Cardiovascular:   Denies chest pain, palpitations, new onset peripheral edema  Gastrointestinal:   Denies nausea, vomiting, diarrhea, abd pain.  Genitourinary: Denies dysuria, freq/ urgency, flank pain or discharge from genitals.  Endocrine:     Denies hot or cold intolerance, polyuria, polydipsia. Musculoskeletal:   Denies unexplained myalgias, joint swelling, unexplained arthralgias, gait problems.  Skin:  Denies new onset rash, suspicious lesions Neurological:     Denies dizziness, unexplained weakness, numbness    Psychiatric/Behavioral:   Denies mood changes, suicidal or homicidal ideations, hallucinations    Objective:   Blood pressure 122/81, pulse 71, height 5\' 2"  (1.575 m), weight 158 lb (71.7 kg). Body mass index is 28.9 kg/m. General:  Well Developed, well nourished, appropriate for stated age.  Neuro:  Alert and oriented,  extra-ocular muscles intact  HEENT:  Normocephalic, atraumatic, neck supple, Right TM completely blocked with copious amount cerumen, hearing dec to soft spoken words.   I used a curette to remove it myself.  This took repeated attempts.  After removal, R TM was opaque and retracted; whitish exudate over TM is well; inflamed and erythematous external auditory canal on the right.   Left TM: Obscured by cerumen and decreased hearing.  Cerumen Hard and irrigation followed by me using curette to remove cerumen in its entirety done times multiple times.  Once removed left TM slight retraction, no erythema or effusion present.  Oropharynx-clear, some tenderness to palpation right maxillary sinus otherwise normal, no lymphadenopathy. Skin:  no gross rash, warm, pink. Cardiac:  RRR, S1 S2 Respiratory:  ECTA B/L and A/P, Not using accessory muscles, speaking in full sentences- unlabored. Vascular:  Ext warm, no cyanosis apprec.; cap RF less 2 sec. Psych:  No HI/SI, judgement and insight good, Euthymic mood. Full Affect.

## 2017-05-07 ENCOUNTER — Ambulatory Visit (INDEPENDENT_AMBULATORY_CARE_PROVIDER_SITE_OTHER): Payer: BLUE CROSS/BLUE SHIELD

## 2017-05-07 DIAGNOSIS — R0789 Other chest pain: Secondary | ICD-10-CM | POA: Diagnosis not present

## 2017-05-07 DIAGNOSIS — I10 Essential (primary) hypertension: Secondary | ICD-10-CM

## 2017-05-07 LAB — EXERCISE TOLERANCE TEST
CHL CUP MPHR: 159 {beats}/min
CHL CUP RESTING HR STRESS: 77 {beats}/min
CHL RATE OF PERCEIVED EXERTION: 17
Estimated workload: 10.1 METS
Exercise duration (min): 9 min
Exercise duration (sec): 0 s
Peak HR: 171 {beats}/min
Percent HR: 108 %

## 2017-06-15 ENCOUNTER — Other Ambulatory Visit: Payer: Self-pay

## 2017-06-15 ENCOUNTER — Telehealth: Payer: Self-pay | Admitting: Family Medicine

## 2017-06-15 DIAGNOSIS — F411 Generalized anxiety disorder: Secondary | ICD-10-CM

## 2017-06-15 MED ORDER — ESCITALOPRAM OXALATE 10 MG PO TABS: 10.0000 mg | ORAL_TABLET | Freq: Every day | ORAL | 0 refills | Status: DC

## 2017-06-15 NOTE — Telephone Encounter (Signed)
Pharmacy sent refill request for Lexapro.  Sent in 30 days patient needs to make an appointment in that time to follow up on chronic issues. MPulliam, CMA/RT(R)

## 2017-06-15 NOTE — Telephone Encounter (Signed)
Dr. Colman Cater med asst ask if I would contact patient to advise of Rx refill granted & a required appointment in 25dys for chronic care and any other Rx refill needs. --- Cld patient / no answer-- Left message for pt to contact office to set up appt for Oct. --glh

## 2017-07-26 ENCOUNTER — Other Ambulatory Visit: Payer: Self-pay | Admitting: Family Medicine

## 2017-07-26 DIAGNOSIS — F411 Generalized anxiety disorder: Secondary | ICD-10-CM

## 2017-07-26 MED ORDER — ESCITALOPRAM OXALATE 10 MG PO TABS: 10.0000 mg | ORAL_TABLET | Freq: Every day | ORAL | 0 refills | Status: DC

## 2017-07-28 ENCOUNTER — Ambulatory Visit: Payer: BLUE CROSS/BLUE SHIELD | Admitting: Family Medicine

## 2017-07-30 DIAGNOSIS — M7741 Metatarsalgia, right foot: Secondary | ICD-10-CM | POA: Diagnosis not present

## 2017-07-30 DIAGNOSIS — M205X1 Other deformities of toe(s) (acquired), right foot: Secondary | ICD-10-CM | POA: Diagnosis not present

## 2017-07-30 DIAGNOSIS — M2041 Other hammer toe(s) (acquired), right foot: Secondary | ICD-10-CM | POA: Diagnosis not present

## 2017-09-13 ENCOUNTER — Other Ambulatory Visit: Payer: Self-pay | Admitting: Orthopedic Surgery

## 2017-09-15 NOTE — Progress Notes (Signed)
Patient ID: Samantha Stark, female   DOB: August 09, 1956, 61 y.o.   MRN: [id number]   61 y.o.  with history of benign palpitations. Previous w/u about 5 years ago negative with w/u including normal myovue Travels with [employer]. Seen in 2013 after travel to   [place] had 2 nights of rapid pounding and palpitations. Does [employer] with no issues. No syncope chest pain or dyspnea. Notes lack of sleep and caffeine make palpitations more likely . No history of thyroid disease. Has not had bad episode since returning from travel. Palpitaitons are rapid skips and flip flops  F/U event monitor benign Thyroid studies have been normal   No presyncope Compliant with BP meds including beta blocker     6 grandkids  Two here and 4 in [place]  Intolerant to most statins Started on crestor once weekly  LDL before this in July was 201  July 2018 Having some chest tightness with exertion Last 8 weeks Stress at work Going to [place] new [address] in [place]  ETT reviewed from 05/07/17 Normal  Unable to even take crestor once weekly    Lab Results  Component Value Date   LDLCALC 201 (H) 04/05/2017    ROS: Depressed and palpitations all other systems reviewed and negative   General: BP 136/80   Pulse 73   Ht 5\' 2"  (1.575 m)   Wt 156 lb 8 oz (71 kg)   SpO2 98%   BMI 28.62 kg/m  Affect appropriate Healthy:  appears stated age 74: normal Neck supple with no adenopathy JVP normal no bruits no thyromegaly Lungs clear with no wheezing and good diaphragmatic motion Heart:  S1/S2 no murmur, no rub, gallop or click PMI normal Abdomen: benighn, BS positve, no tenderness, no AAA no bruit.  No HSM or HJR Distal pulses intact with no bruits No edema Neuro non-focal Skin warm and dry No muscular weakness     Current Outpatient Medications  Medication Sig Dispense Refill  . aspirin EC 81 MG tablet Take 1 tablet (81 mg total) by mouth daily. 90 tablet 3  .  cyclobenzaprine (FLEXERIL) 5 MG tablet Take 5-10 mg by mouth 3 (three) times daily as needed for muscle spasms. Reported on 03/04/2016    . EPINEPHrine 0.3 mg/0.3 mL IJ SOAJ injection Inject 0.3 mg into the muscle as directed. Reported on 03/04/2016    . hydrocodone-ibuprofen (VICOPROFEN) 5-200 MG tablet Take 1 tablet by mouth every 8 (eight) hours as needed for pain. 30 tablet 0  . lisinopril (PRINIVIL,ZESTRIL) 20 MG tablet Take 1 tablet (20 mg total) by mouth daily. 30 tablet 11  . metoprolol succinate (TOPROL-XL) 25 MG 24 hr tablet Take 1 tablet (25 mg total) by mouth daily. 90 tablet 3  . Nutritional Supplements (JUICE PLUS FIBRE PO) Take by mouth daily.    . propranolol (INDERAL) 10 MG tablet Take one tablet by mouth as needed for palpitations up to 3 per day and at least 6 hours apart. 90 tablet 1  . spironolactone (ALDACTONE) 25 MG tablet Take 25 mg by mouth daily as needed (fluid).     No current facility-administered medications for this visit.     Allergies  Iodine; Shrimp [shellfish allergy]; Amlodipine besylate; Calcium channel blockers; Cephalexin; Codeine; Coricidin d cold-flu-sinus [chlorphen-pe-acetaminophen]; Crestor [rosuvastatin]; Erythromycin; Hydrochlorothiazide w-triamterene; Morphine; Phenylephrine hcl; and Statins  Electrocardiogram:  05/12/13  SR rate 61 normal  10/26/14  SR rate 69 normal   03/02/16  SR ate 73 normal  04/09/17  SR rate 73 normal   Assessment and Plan  Palpitations : benign infrequent observe  HTN: Well controlled.  Continue current medications and low sodium Dash type diet.    Elevated Liipids  Has had leg pain with lipitor, pravachol and one other statin On crestor once Weekly now intolerant Will try zetia and have her f/u in lipid clinic Calcium score 04/13/16 84 which was 107 th percentile for age and sex. She can discuss PSK9 for primary prevention with cholesterol clinic But suspect cost would be high   Last LDL elevated 201 04/05/17   Chest Pain:   ECG normal  ETT normal on 05/07/17          Jenkins Rouge

## 2017-09-17 ENCOUNTER — Ambulatory Visit (INDEPENDENT_AMBULATORY_CARE_PROVIDER_SITE_OTHER): Payer: BLUE CROSS/BLUE SHIELD | Admitting: Cardiovascular Disease

## 2017-09-17 ENCOUNTER — Encounter: Payer: Self-pay | Admitting: Cardiovascular Disease

## 2017-09-17 VITALS — BP 136/80 | HR 73 | Ht 62.0 in | Wt 156.5 lb

## 2017-09-17 DIAGNOSIS — E782 Mixed hyperlipidemia: Secondary | ICD-10-CM

## 2017-09-17 MED ORDER — EZETIMIBE 10 MG PO TABS: 10.0000 mg | ORAL_TABLET | Freq: Every day | ORAL | 3 refills | Status: DC

## 2017-09-17 NOTE — Patient Instructions (Addendum)
Medication Instructions:  Your physician has recommended you make the following change in your medication:  1-START Zetia 10 mg by mouth daily.  Labwork: NONE  Testing/Procedures: NONE  Follow-Up: Your physician recommends that you schedule an appointment in: lipid clinic.  Your physician wants you to follow-up in: 12 months with Dr. [name]. You will receive a reminder letter in the mail two months in advance. If you don't receive a letter, please call our office to schedule the follow-up appointment.   If you need a refill on your cardiac medications before your next appointment, please call your pharmacy.

## 2017-09-23 ENCOUNTER — Encounter: Payer: Self-pay | Admitting: Family Medicine

## 2017-09-23 ENCOUNTER — Ambulatory Visit (INDEPENDENT_AMBULATORY_CARE_PROVIDER_SITE_OTHER): Payer: BLUE CROSS/BLUE SHIELD | Admitting: Family Medicine

## 2017-09-23 VITALS — BP 124/74 | HR 84 | Temp 98.5°F | Ht 62.0 in | Wt 152.0 lb

## 2017-09-23 DIAGNOSIS — Z1231 Encounter for screening mammogram for malignant neoplasm of breast: Secondary | ICD-10-CM

## 2017-09-23 DIAGNOSIS — E782 Mixed hyperlipidemia: Secondary | ICD-10-CM

## 2017-09-23 DIAGNOSIS — M79642 Pain in left hand: Secondary | ICD-10-CM | POA: Diagnosis not present

## 2017-09-23 DIAGNOSIS — I10 Essential (primary) hypertension: Secondary | ICD-10-CM

## 2017-09-23 DIAGNOSIS — F4323 Adjustment disorder with mixed anxiety and depressed mood: Secondary | ICD-10-CM | POA: Diagnosis not present

## 2017-09-23 DIAGNOSIS — T7802XD 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 (crustaceans), subsequent encounter: Secondary | ICD-10-CM

## 2017-09-23 DIAGNOSIS — Z1239 Encounter for other screening for malignant neoplasm of breast: Secondary | ICD-10-CM

## 2017-09-23 DIAGNOSIS — R002 Palpitations: Secondary | ICD-10-CM | POA: Diagnosis not present

## 2017-09-23 DIAGNOSIS — M79641 Pain in right hand: Secondary | ICD-10-CM

## 2017-09-23 MED ORDER — LISINOPRIL 20 MG PO TABS: 20.0000 mg | ORAL_TABLET | Freq: Every day | ORAL | 3 refills | Status: DC

## 2017-09-23 NOTE — Assessment & Plan Note (Signed)
New- resolving. ?allergic reaction to garland? Advised prn benadryl, soaking hands in warm epsom salts. Call or return to clinic prn if these symptoms worsen or fail to improve as anticipated. The patient indicates understanding of these issues and agrees with the plan.

## 2017-09-23 NOTE — Progress Notes (Signed)
Subjective:   Patient ID: Samantha Stark, female    DOB: 1956-08-03, 61 y.o.   MRN: [id number]  ISA HITZ is a pleasant 61 y.o. year old female who presents to clinic today with [place] (Patient is here today to establish care.  She is currently fasting.) and Hand Problem (She states that she put artificial garland up and since then she has felt as if little needles are all in her hands but no visible signs. They were brand new.  She washed her hands directly afterwards.)  on 09/23/2017  HPI:  Health Maintenance  Topic Date Due  . DEXA SCAN  04/26/2018 (Originally 06/02/2016)  . MAMMOGRAM  04/15/2018  . PAP SMEAR  04/18/2019  . COLONOSCOPY  03/20/2021  . TETANUS/TDAP  08/12/2021  . INFLUENZA VACCINE  Completed  . Hepatitis C Screening  Completed  . HIV Screening  Completed   HLD- followed by cardiology for this and HTN. Was last seen by Dr. [name] on 09/17/17. Note reviewed. At that [place], started Zetia and advised her to follow up in lipid clinic- intolerant to numerous statins.  Just started Zetia today.  Has appointment in lipid clilnic January 4th.  Lab Results  Component Value Date   CHOL 273 (H) 04/05/2017   HDL 60 04/05/2017   LDLCALC 201 (H) 04/05/2017   LDLDIRECT 139.1 05/12/2013   TRIG 60 04/05/2017   CHOLHDL 4.6 (H) 04/05/2017   Lab Results  Component Value Date   ALT 16 06/23/2016   AST 22 06/23/2016   ALKPHOS 57 06/23/2016   BILITOT 0.4 06/23/2016     BP has been well controlled with Lisinopril 20 mg daily.  Takes spironolactone as needed for edema.  Palpitations- has not Toprol XL 25 mg as needed but has not needed this in over year.  Denies HA, blurred vision, CP or SOB.  NO LE edema. Lab Results  Component Value Date   CREATININE 0.65 06/23/2016   Hand pain- was putting up artificial garland last week.  Since then, tops of hands feel prickly or tingly.  No erythema, rashes, no decreased grip strength.  Has placed topical neosporin on  it without any improvement.  Adjustment disorder- currently only taking [place] wart and feels her "emotions are well controlled."  Current Outpatient Medications on File Prior to Visit  Medication Sig Dispense Refill  . aspirin EC 81 MG tablet Take 1 tablet (81 mg total) by mouth daily. 90 tablet 3  . cyclobenzaprine (FLEXERIL) 5 MG tablet Take 5-10 mg by mouth 3 (three) times daily as needed for muscle spasms. Reported on 03/04/2016    . EPINEPHrine 0.3 mg/0.3 mL IJ SOAJ injection Inject 0.3 mg into the muscle as directed. Reported on 03/04/2016    . metoprolol succinate (TOPROL-XL) 25 MG 24 hr tablet Take 1 tablet (25 mg total) by mouth daily. 90 tablet 3  . Nutritional Supplements (JUICE PLUS FIBRE PO) Take by mouth daily.    . propranolol (INDERAL) 10 MG tablet Take one tablet by mouth as needed for palpitations up to 3 per day and at least 6 hours apart. 90 tablet 1  . spironolactone (ALDACTONE) 25 MG tablet Take 25 mg by mouth daily as needed (fluid).    . ezetimibe (ZETIA) 10 MG tablet Take 1 tablet (10 mg total) by mouth daily. (Patient not taking: Reported on 09/23/2017) 90 tablet 3   No current facility-administered medications on file prior to visit.     Allergies  Allergen Reactions  .  Iodine Anaphylaxis  . Shrimp [Shellfish Allergy]     Angioedema caused by shrimp as well as oysters. EpiPen current  . Amlodipine Besylate     REACTION: edema, fatigue  . Calcium Channel Blockers     REACTION: edema , fatigue  . Cephalexin     REACTION: diarrhea  . Codeine     REACTION: nausea  . Coricidin D Cold-Flu-Sinus [Chlorphen-Pe-Acetaminophen] Hives  . Crestor [Rosuvastatin]     Per patient gave her leg cramps   . Erythromycin Diarrhea  . Hydrochlorothiazide W-Triamterene     REACTION: low potassium  . Morphine Nausea And Vomiting  . Phenylephrine Hcl Hives  . Statins     Leg cramps with Lipitor & Pravachol    Past Medical History:  Diagnosis Date  . Allergy   .  Anxiety    Situational  . Cancer ([place])    scalp  . Chest pain 2006 & 2008   Negative nuclear stress study, Dr. [name]  . Diverticulosis   . Hyperlipidemia   . Hypertension   . MVP (mitral valve prolapse)   . Seasonal allergies     Past Surgical History:  Procedure Laterality Date  . ABDOMINAL HYSTERECTOMY    . COLONOSCOPY  2008 & 2011   [facility], negative except for Tics  . KNEE ARTHROSCOPY Bilateral 2002, 2007  . PARTIAL HYSTERECTOMY  1993  . ROTATOR CUFF REPAIR Right 2010  . SPINE SURGERY  1992   Lumbar laminectomy L5-S1  . TONSILLECTOMY  1976  . TUBAL LIGATION      Family History  Problem Relation Age of Onset  . Heart attack Maternal Uncle        MI in 62s  . Colon cancer Maternal Uncle 62  . Osteoporosis Maternal Grandmother   . Diabetes Maternal Grandmother   . Colon polyps Mother   . Hyperlipidemia Mother   . Goiter Mother        also Rosalee Kaufman  . Asthma Mother   . Colon polyps Brother   . Hyperlipidemia Brother        X2  . Colon polyps Maternal Aunt   . Diabetes Maternal Aunt        X2  . Alcohol abuse Father   . Hyperlipidemia Sister   . Stroke Maternal Grandfather        in 64s  . Alzheimer's disease Maternal Aunt   . Alcohol abuse Paternal Aunt   . Hyperlipidemia Paternal Aunt   . Alcohol abuse Paternal Uncle   . Diabetes Paternal Grandmother   . Hyperlipidemia Brother     Social History   Socioeconomic History  . Marital status: Married    Spouse name: Not on file  . Number of children: Not on file  . Years of education: Not on file  . Highest education level: Not on file  Social Needs  . Financial resource strain: Not on file  . Food insecurity - worry: Not on file  . Food insecurity - inability: Not on file  . Transportation needs - medical: Not on file  . Transportation needs - non-medical: Not on file  Occupational History  . Occupation: Surveyor, mining: [employer]  Tobacco Use  . Smoking status: Never  Smoker  . Smokeless tobacco: Never Used  Substance and Sexual Activity  . Alcohol use: Yes    Alcohol/week: 1.8 oz    Types: 3 Glasses of wine per week  . Drug use: No  . Sexual activity:  Yes    Partners: Male    Birth control/protection: Surgical  Other Topics Concern  . Not on file  Social History Narrative   Married.   2 children, 6 grandchildren.   Regular exercise: yes   Caffeine use: daily   Enjoys reading, gardening, sports, teaching yoga.   The PMH, PSH, Social History, Family History, Medications, and allergies have been reviewed in [facility], and have been updated if relevant.   Review of Systems  Constitutional: Negative.   HENT: Negative.   Eyes: Negative.   Respiratory: Negative.   Cardiovascular: Negative.   Gastrointestinal: Negative.   Endocrine: Negative.   Genitourinary: Negative.   Musculoskeletal: Positive for arthralgias.  Skin: Negative.   Allergic/Immunologic: Negative.   Neurological: Negative.   Hematological: Negative.   Psychiatric/Behavioral: Negative.   All other systems reviewed and are negative.      Objective:    BP 124/74 (BP Location: Left Arm, Patient Position: Sitting, Cuff Size: Normal)   Pulse 84   Temp 98.5 F (36.9 C) (Oral)   Ht 5\' 2"  (1.575 m)   Wt 152 lb (68.9 kg)   SpO2 97%   BMI 27.80 kg/m    Physical Exam   General:  Well-developed,well-nourished,in no acute distress; alert,appropriate and cooperative throughout examination Head:  normocephalic and atraumatic.   Eyes:  vision grossly intact, PERRL Ears:  R ear normal and L ear normal externally, TMs clear bilaterally Nose:  no external deformity.   Mouth:  good dentition.   Neck:  No deformities, masses, or tenderness noted. Breasts:  No mass, nodules, thickening, tenderness, bulging, retraction, inflamation, nipple discharge or skin changes noted.   Lungs:  Normal respiratory effort, chest expands symmetrically. Lungs are clear to auscultation, no crackles or  wheezes. Heart:  Normal rate and regular rhythm. S1 and S2 normal without gallop, murmur, click, rub or other extra sounds. Msk:  No deformity or scoliosis noted of thoracic or lumbar spine.   Extremities:  No clubbing, cyanosis, edema, or deformity noted with normal full range of motion of all joints.   Neurologic:  alert & oriented X3 and gait normal.   Skin:  Intact without suspicious lesions or rashes No abnormalities noted on her hands Psych:  Cognition and judgment appear intact. Alert and cooperative with normal attention span and concentration. No apparent delusions, illusions, hallucinations       Assessment & Plan:   Adjustment disorder   Essential hypertension - Plan: lisinopril (PRINIVIL,ZESTRIL) 20 MG tablet  Benign heart Palpitations  Anaphylactic shock due to shellfish, subsequent encounter  Bilateral hand pain  Breast cancer screening - Plan: MM Digital Screening No Follow-up on file.

## 2017-09-23 NOTE — Patient Instructions (Addendum)
Great to meet you.  Please call the breast center at [phone] to schedule your mammogram.  Have a Very Merry Christmas!  Please schedule a physical exam on your way out.

## 2017-09-23 NOTE — Assessment & Plan Note (Signed)
On zetia (just started this through cardiology). Has appt with Lipid clinic next month.

## 2017-09-23 NOTE — Assessment & Plan Note (Signed)
Well controlled with daily Lisinopril. eRx refills sent.

## 2017-09-23 NOTE — Assessment & Plan Note (Signed)
Has as needed Toprol to use for this but has not needed to take it over a year. Palpitations seem to be mainly stress induced.

## 2017-09-23 NOTE — Assessment & Plan Note (Signed)
She feels she is coping well with [place] only, and without SSRI. She will keep me updated.

## 2017-10-06 ENCOUNTER — Telehealth: Payer: Self-pay | Admitting: Family Medicine

## 2017-10-06 ENCOUNTER — Encounter: Payer: Self-pay | Admitting: Family Medicine

## 2017-10-06 ENCOUNTER — Ambulatory Visit (INDEPENDENT_AMBULATORY_CARE_PROVIDER_SITE_OTHER): Payer: BLUE CROSS/BLUE SHIELD | Admitting: Family Medicine

## 2017-10-06 VITALS — BP 128/78 | HR 68 | Temp 98.0°F | Ht 62.0 in

## 2017-10-06 DIAGNOSIS — E559 Vitamin D deficiency, unspecified: Secondary | ICD-10-CM

## 2017-10-06 DIAGNOSIS — M79641 Pain in right hand: Secondary | ICD-10-CM

## 2017-10-06 DIAGNOSIS — M79642 Pain in left hand: Secondary | ICD-10-CM

## 2017-10-06 LAB — VITAMIN D 25 HYDROXY (VIT D DEFICIENCY, FRACTURES): VITD: 25.48 ng/mL — AB (ref 30.00–100.00)

## 2017-10-06 LAB — TSH: TSH: 3.7 u[IU]/mL (ref 0.35–4.50)

## 2017-10-06 MED ORDER — GABAPENTIN 100 MG PO CAPS: 100.0000 mg | ORAL_CAPSULE | Freq: Three times a day (TID) | ORAL | 3 refills | Status: DC

## 2017-10-06 NOTE — Telephone Encounter (Signed)
Left VM for patient. If she calls back please have her  speak with a nurse/CMA and inform her that her TSH was normal. Her vitamin D was low. We can replace this but I don't know if it will improve her symptoms.   If any questions then please take the best time and phone number to call and I will try to call her back.   Rosemarie Ax, MD [facility] 10/06/2017, 12:08 PM

## 2017-10-06 NOTE — Telephone Encounter (Signed)
Copied from [place]. Topic: Quick Communication - Lab Results >> Oct 06, 2017 12:42 PM Yvette Rack wrote:  patient calling the Doctor back he had left a message on her voice mail for her to call and speak with nurse/cma about lab results I called the practice there was no answer

## 2017-10-06 NOTE — Telephone Encounter (Signed)
Patient notified, verbalized understanding. Patient was taking a Vitamin D supplement but stopped, she will begin taking this again.

## 2017-10-06 NOTE — Patient Instructions (Addendum)
The gabapentin can make you drowsy so please take it with caution before driving. You can take this as 100 mg at night and then work your way up to three pills a day as you tolerate.  We will call you with the results from today.  Please follow up with me if your symptoms don't improve.

## 2017-10-06 NOTE — Assessment & Plan Note (Signed)
Unclear if the pain is related to a reaction to the garland when she first noticed these symptoms or if related to a systemic problem.  - TSH and Vit D  - provided gabapentin - If lab work unrevealing could consider EMG vs trial of steroids (declined today) vs allergy testing.

## 2017-10-06 NOTE — Progress Notes (Signed)
LEISL SPURRIER - 61 y.o. female MRN [id number]  Date of birth: [birth year]  SUBJECTIVE:  Including CC & ROS.  Chief Complaint  Patient presents with  . Hand sensitivity    Vidhi Delellis is a 61 y.o. female that is presenting with bilateral hand sensitivity. Symptoms have been present for three weeks. She has tried soaking her hands in Epsom salt and Aleve with no improvement. Described as constantly cold and pins/needle sensation. She states she feels the same sensation in her cheeks. The sensation has been staying the same in her hands. The sensation is localized. No radicular symptoms. Has recently weaned off her lexapro. No history of similar symptoms. Putting her hands into her gloves, under warm water or the cold air. Denies any weakness. Does not tolerate steroids.    Review of her labwork from 06/23/16 shows a normal TSH, Hgb and kidney function. Her cholesterol is elevated. Normal A1c, RPR, HIV and HCV.   Vit D from 2008 was low normal.    Review of Systems  Constitutional: Negative for fever.  HENT: Negative for ear pain.   Respiratory: Negative for shortness of breath.   Cardiovascular: Negative for chest pain.  Gastrointestinal: Negative for abdominal pain.  Musculoskeletal: Negative for gait problem.  Skin: Negative for color change.  Neurological: Negative for weakness.  Hematological: Negative for adenopathy.    HISTORY: Past Medical, Surgical, Social, and Family History Reviewed & Updated per EMR.   Pertinent Historical Findings include:  Past Medical History:  Diagnosis Date  . Allergy   . Anxiety    Situational  . Cancer ([place])    scalp  . Chest pain 2006 & 2008   Negative nuclear stress study, Dr. [name]  . Diverticulosis   . Hyperlipidemia   . Hypertension   . MVP (mitral valve prolapse)   . Seasonal allergies     Past Surgical History:  Procedure Laterality Date  . ABDOMINAL HYSTERECTOMY    . COLONOSCOPY  2008 & 2011   [facility], negative  except for Tics  . KNEE ARTHROSCOPY Bilateral 2002, 2007  . PARTIAL HYSTERECTOMY  1993  . ROTATOR CUFF REPAIR Right 2010  . SPINE SURGERY  1992   Lumbar laminectomy L5-S1  . TONSILLECTOMY  1976  . TUBAL LIGATION      Allergies  Allergen Reactions  . Iodine Anaphylaxis  . Shrimp [Shellfish Allergy]     Angioedema caused by shrimp as well as oysters. EpiPen current  . Amlodipine Besylate     REACTION: edema, fatigue  . Calcium Channel Blockers     REACTION: edema , fatigue  . Cephalexin     REACTION: diarrhea  . Codeine     REACTION: nausea  . Coricidin D Cold-Flu-Sinus [Chlorphen-Pe-Acetaminophen] Hives  . Crestor [Rosuvastatin]     Per patient gave her leg cramps   . Erythromycin Diarrhea  . Hydrochlorothiazide W-Triamterene     REACTION: low potassium  . Morphine Nausea And Vomiting  . Phenylephrine Hcl Hives  . Statins     Leg cramps with Lipitor & Pravachol    Family History  Problem Relation Age of Onset  . Heart attack Maternal Uncle        MI in 66s  . Colon cancer Maternal Uncle 80  . Osteoporosis Maternal Grandmother   . Diabetes Maternal Grandmother   . Colon polyps Mother   . Hyperlipidemia Mother   . Goiter Mother        also Rosalee Kaufman  . Asthma Mother   .  Colon polyps Brother   . Hyperlipidemia Brother        X2  . Colon polyps Maternal Aunt   . Diabetes Maternal Aunt        X2  . Alcohol abuse Father   . Hyperlipidemia Sister   . Stroke Maternal Grandfather        in 51s  . Alzheimer's disease Maternal Aunt   . Alcohol abuse Paternal Aunt   . Hyperlipidemia Paternal Aunt   . Alcohol abuse Paternal Uncle   . Diabetes Paternal Grandmother   . Hyperlipidemia Brother      Social History   Socioeconomic History  . Marital status: Married    Spouse name: Not on file  . Number of children: Not on file  . Years of education: Not on file  . Highest education level: Not on file  Social Needs  . Financial resource strain: Not on file    . Food insecurity - worry: Not on file  . Food insecurity - inability: Not on file  . Transportation needs - medical: Not on file  . Transportation needs - non-medical: Not on file  Occupational History  . Occupation: Surveyor, mining: [employer]  Tobacco Use  . Smoking status: Never Smoker  . Smokeless tobacco: Never Used  Substance and Sexual Activity  . Alcohol use: Yes    Alcohol/week: 1.8 oz    Types: 3 Glasses of wine per week  . Drug use: No  . Sexual activity: Yes    Partners: Male    Birth control/protection: Surgical  Other Topics Concern  . Not on file  Social History Narrative   Married.   2 children, 6 grandchildren.   Regular exercise: yes   Caffeine use: daily   Enjoys reading, gardening, sports, teaching yoga.     PHYSICAL EXAM:  VS: BP 128/78 (BP Location: Left Arm, Patient Position: Sitting, Cuff Size: Normal)   Pulse 68   Temp 98 F (36.7 C) (Oral)   Ht 5\' 2"  (1.575 m)   SpO2 98%   BMI 27.80 kg/m  Physical Exam Gen: NAD, alert, cooperative with exam, well-appearing ENT: normal lips, normal nasal mucosa,  Eye: normal EOM, normal conjunctiva and lids CV:  no edema, +2 pedal pulses   Resp: no accessory muscle use, non-labored,   Skin: no abnormal rash on her face or the back of her head, no areas of induration  Neuro: normal tone, normal sensation to touch Psych:  normal insight, alert and oriented MSK:  Left and right hand:   No signs of atrophy Normal wrist ROM  Normal wrist strength to resistance  Normal finger adduction and abduction  Normal pincer grasp  Normal thumb opposition  Normal elbow ROM  Negative Phalen's Neurovascularly intact   No TTP of the shoulders or trapezius      ASSESSMENT & PLAN:   Bilateral hand pain Unclear if the pain is related to a reaction to the garland when she first noticed these symptoms or if related to a systemic problem.  - TSH and Vit D  - provided gabapentin - If lab work  unrevealing could consider EMG vs trial of steroids (declined today) vs allergy testing.

## 2017-10-13 ENCOUNTER — Other Ambulatory Visit: Payer: Self-pay | Admitting: Family Medicine

## 2017-10-13 ENCOUNTER — Encounter: Payer: Self-pay | Admitting: Family Medicine

## 2017-10-13 DIAGNOSIS — F411 Generalized anxiety disorder: Secondary | ICD-10-CM

## 2017-10-13 MED ORDER — ESCITALOPRAM OXALATE 10 MG PO TABS: 10.0000 mg | ORAL_TABLET | Freq: Every day | ORAL | 6 refills | Status: DC

## 2017-10-15 ENCOUNTER — Ambulatory Visit: Payer: BLUE CROSS/BLUE SHIELD

## 2017-10-15 NOTE — Progress Notes (Deleted)
Patient ID: LULABELLE DESTA                 DOB: 12/16/1955                    MRN: [id number]     HPI: Samantha Stark is a 62 y.o. female patient referred to lipid clinic by Dr [name]. PMH is significant for HTN, HLD, and anxiety. She had a calcium score in 2017 of 84 which was 85th percentile for age ad sex. She was started on Zetia one month ago in clinic and presents today for further management.  livalo with the zetia  Current Medications: Zetia 10mg  daily Intolerances: Crestor once weekly, Lipitor, pravastatin Risk Factors: elevated calcium score, baseline LDL > 190 LDL goal: 100mg /dL  Diet:   Exercise: Does Taekwando  Family History: Maternal uncle with MI in his 50s. Brother and sister with hyperlipidemia. Maternal grandmother and aunt with DM. Maternal grandfather with stroke in his 16s.  Social History: Denies tobacco and illicit drug use. Drinks alcohol occasionally.  Labs: 03/2017: TC 273, HDL 60, LDL 201, TG 60   Past Medical History:  Diagnosis Date  . Allergy   . Anxiety    Situational  . Cancer ([place])    scalp  . Chest pain 2006 & 2008   Negative nuclear stress study, Dr. [name]  . Diverticulosis   . Hyperlipidemia   . Hypertension   . MVP (mitral valve prolapse)   . Seasonal allergies     Current Outpatient Medications on File Prior to Visit  Medication Sig Dispense Refill  . aspirin EC 81 MG tablet Take 1 tablet (81 mg total) by mouth daily. 90 tablet 3  . cyclobenzaprine (FLEXERIL) 5 MG tablet Take 5-10 mg by mouth 3 (three) times daily as needed for muscle spasms. Reported on 03/04/2016    . EPINEPHrine 0.3 mg/0.3 mL IJ SOAJ injection Inject 0.3 mg into the muscle as directed. Reported on 03/04/2016    . escitalopram (LEXAPRO) 10 MG tablet Take 1 tablet (10 mg total) by mouth daily. 30 tablet 6  . ezetimibe (ZETIA) 10 MG tablet Take 1 tablet (10 mg total) by mouth daily. 90 tablet 3  . gabapentin (NEURONTIN) 100 MG capsule Take 1 capsule (100 mg  total) by mouth 3 (three) times daily. 90 capsule 3  . lisinopril (PRINIVIL,ZESTRIL) 20 MG tablet Take 1 tablet (20 mg total) by mouth daily. 90 tablet 3  . metoprolol succinate (TOPROL-XL) 25 MG 24 hr tablet Take 1 tablet (25 mg total) by mouth daily. 90 tablet 3  . Nutritional Supplements (JUICE PLUS FIBRE PO) Take by mouth daily.    . propranolol (INDERAL) 10 MG tablet Take one tablet by mouth as needed for palpitations up to 3 per day and at least 6 hours apart. 90 tablet 1  . spironolactone (ALDACTONE) 25 MG tablet Take 25 mg by mouth daily as needed (fluid).     No current facility-administered medications on file prior to visit.     Allergies  Allergen Reactions  . Iodine Anaphylaxis  . Shrimp [Shellfish Allergy]     Angioedema caused by shrimp as well as oysters. EpiPen current  . Amlodipine Besylate     REACTION: edema, fatigue  . Calcium Channel Blockers     REACTION: edema , fatigue  . Cephalexin     REACTION: diarrhea  . Codeine     REACTION: nausea  . Coricidin D Cold-Flu-Sinus [Chlorphen-Pe-Acetaminophen] Hives  . Crestor [  Rosuvastatin]     Per patient gave her leg cramps   . Erythromycin Diarrhea  . Hydrochlorothiazide W-Triamterene     REACTION: low potassium  . Morphine Nausea And Vomiting  . Phenylephrine Hcl Hives  . Statins     Leg cramps with Lipitor & Pravachol    Assessment/Plan:  1. Hyperlipidemia -

## 2017-10-21 ENCOUNTER — Encounter: Payer: Self-pay | Admitting: Family Medicine

## 2017-10-22 ENCOUNTER — Other Ambulatory Visit: Payer: Self-pay | Admitting: Family Medicine

## 2017-10-22 MED ORDER — CIPROFLOXACIN HCL 250 MG PO TABS: 250.0000 mg | ORAL_TABLET | Freq: Two times a day (BID) | ORAL | 0 refills | Status: DC

## 2017-10-28 ENCOUNTER — Ambulatory Visit (HOSPITAL_BASED_OUTPATIENT_CLINIC_OR_DEPARTMENT_OTHER): Admit: 2017-10-28 | Payer: Self-pay | Admitting: Orthopedic Surgery

## 2017-10-28 ENCOUNTER — Encounter (HOSPITAL_BASED_OUTPATIENT_CLINIC_OR_DEPARTMENT_OTHER): Payer: Self-pay

## 2017-10-28 SURGERY — OSTEOTOMY, WEIL
Anesthesia: General | Laterality: Right

## 2017-10-29 ENCOUNTER — Encounter: Payer: Self-pay | Admitting: Pharmacist

## 2017-10-29 ENCOUNTER — Ambulatory Visit (INDEPENDENT_AMBULATORY_CARE_PROVIDER_SITE_OTHER): Payer: BLUE CROSS/BLUE SHIELD | Admitting: Pharmacist

## 2017-10-29 DIAGNOSIS — E782 Mixed hyperlipidemia: Secondary | ICD-10-CM | POA: Diagnosis not present

## 2017-10-29 NOTE — Patient Instructions (Signed)
Continue Zetia 10mg  daily.  We will repeat labs in 4 week.    Cholesterol Cholesterol is a fat. Your body needs a small amount of cholesterol. Cholesterol (plaque) may build up in your blood vessels (arteries). That makes you more likely to have a heart attack or stroke. You cannot feel your cholesterol level. Having a blood test is the only way to find out if your level is high. Keep your test results. Work with your doctor to keep your cholesterol at a good level. What do the results mean?  Total cholesterol is how much cholesterol is in your blood.  LDL is bad cholesterol. This is the type that can build up. Try to have low LDL.  HDL is good cholesterol. It cleans your blood vessels and carries LDL away. Try to have high HDL.  Triglycerides are fat that the body can store or burn for energy. What are good levels of cholesterol?  Total cholesterol below 200.  LDL below 100 is good for people who have health risks. LDL below 70 is good for people who have very high risks.  HDL above 40 is good. It is best to have HDL of 60 or higher.  Triglycerides below 150. How can I lower my cholesterol? Diet Follow your diet program as told by your doctor.  Choose fish, white meat chicken, or [place] that is roasted or baked. Try not to eat red meat, fried foods, sausage, or lunch meats.  Eat lots of fresh fruits and vegetables.  Choose whole grains, beans, pasta, potatoes, and cereals.  Choose olive oil, corn oil, or canola oil. Only use small amounts.  Try not to eat butter, mayonnaise, shortening, or palm kernel oils.  Try not to eat foods with trans fats.  Choose low-fat or nonfat dairy foods. ? Drink skim or nonfat milk. ? Eat low-fat or nonfat yogurt and cheeses. ? Try not to drink whole milk or cream. ? Try not to eat ice cream, egg yolks, or full-fat cheeses.  Healthy desserts include angel food cake, ginger snaps, animal crackers, hard candy, popsicles, and low-fat or  nonfat frozen yogurt. Try not to eat pastries, cakes, pies, and cookies.  Exercise Follow your exercise program as told by your doctor.  Be more active. Try gardening, walking, and taking the stairs.  Ask your doctor about ways that you can be more active.  Medicine  Take over-the-counter and prescription medicines only as told by your doctor. This information is not intended to replace advice given to you by your health care provider. Make sure you discuss any questions you have with your health care provider. Document Released: 12/25/2008 Document Revised: 04/29/2016 Document Reviewed: 04/09/2016 Elsevier Interactive Patient Education  [employer].

## 2017-10-29 NOTE — Progress Notes (Signed)
Patient ID: [name]                 DOB: [birth year]                    MRN: [id number]     HPI: Samantha Stark is a 62 y.o. female patient of Samantha Stark that presents today for lipid follow up with a history of myalgias on statins and elevated LFTs on RYR and niacin.  PMH includes palpitations and an elevated calcium score (calcium score 84; 85th percentile for age and sex).  She has experienced myalgias in her legs with Lipitor, pravastatin and Vytorin.  She was previously taking OTC red yeast rice and Niacin.  This resulted in LFTs.  She stopped these therapies. Since then she was seen by Samantha Stark, PharmD in the lipid clinic and started on psyllium and worked on diet and exercise. After repeat labs her cholesterol remained elevated and she agreed to trial Crestor 5mg  once weekly with plan to increase as tolerated. She reports that she was unable to tolerate Crestor due to muscle aching   She presents today for follow up. She is now taking Zetia and had stomach issues the first few weeks which resovled now and no leg pain. She reports that several of her family members have elevated cholesterol, but have never had an event.   Risk Factors: elevated calcium score, HTN (ASCVD lifetime risk 50%, 10 year risk at least 5.5%) LDL Goal: <100  Current Medications: Zetia 10mg  daily  Intolerances: Lipitor (myalgias), Vytorin (myalgias), pravastatin (myalgias), Red Yeast Rice and Niacin (elevated LFTs), Crestor (leg pain within a couple days of starting that resolved several days after stopping.)  Diet: Pt has several food intolerances so is gluten free and eats very limited dairy.  Breakfast- 2 eggs and oatmeal, Snack- berries or apples, Lunch- salad with grilled chicken.  She does not eat bread, rice or potatoes.  She does a lot of legumes and quinoa.  Does not drink soft drinks.  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  Family History: Maternal uncle x 2- MI  after the age of 6.  Brother diagnosed with CAD but no event at 61   Social History: Never smoked.  2-3 glasses of wine per week.   Labs: 04/05/17 - TC 273, TG 60, HDL 60, LDL 201 (no therapy) 05/01/16- TC 226, TG 57, HDL 51, LDL 164, AST 23, ALT 17 (no therapy) 04/13/16- TC 276, TG 119, HDL 68, LDL 184, AST 83, ALT 37 (Red Yeast Rice and Niacin) 05/15/15- TC 259, TG 119, HDL 52, LDL 183, LDL-P 1630 (no therapy)  Past Medical History:  Diagnosis Date  . Allergy   . Anxiety    Situational  . Cancer ([place])    scalp  . Chest pain 2006 & 2008   Negative nuclear stress study, Samantha Stark  . Diverticulosis   . Hyperlipidemia   . Hypertension   . MVP (mitral valve prolapse)   . Seasonal allergies     Current Outpatient Medications on File Prior to Visit  Medication Sig Dispense Refill  . aspirin EC 81 MG tablet Take 1 tablet (81 mg total) by mouth daily. 90 tablet 3  . ciprofloxacin (CIPRO) 250 MG tablet Take 1 tablet (250 mg total) by mouth 2 (two) times daily. 6 tablet 0  . cyclobenzaprine (FLEXERIL) 5 MG tablet Take 5-10 mg by mouth 3 (three) times daily as needed for muscle spasms.  Reported on 03/04/2016    . EPINEPHrine 0.3 mg/0.3 mL IJ SOAJ injection Inject 0.3 mg into the muscle as directed. Reported on 03/04/2016    . escitalopram (LEXAPRO) 10 MG tablet Take 1 tablet (10 mg total) by mouth daily. 30 tablet 6  . ezetimibe (ZETIA) 10 MG tablet Take 1 tablet (10 mg total) by mouth daily. 90 tablet 3  . gabapentin (NEURONTIN) 100 MG capsule Take 1 capsule (100 mg total) by mouth 3 (three) times daily. 90 capsule 3  . lisinopril (PRINIVIL,ZESTRIL) 20 MG tablet Take 1 tablet (20 mg total) by mouth daily. 90 tablet 3  . metoprolol succinate (TOPROL-XL) 25 MG 24 hr tablet Take 1 tablet (25 mg total) by mouth daily. 90 tablet 3  . Nutritional Supplements (JUICE PLUS FIBRE PO) Take by mouth daily.    . propranolol (INDERAL) 10 MG tablet Take one tablet by mouth as needed for palpitations up to  3 per day and at least 6 hours apart. 90 tablet 1  . spironolactone (ALDACTONE) 25 MG tablet Take 25 mg by mouth daily as needed (fluid).     No current facility-administered medications on file prior to visit.     Allergies  Allergen Reactions  . Iodine Anaphylaxis  . Shrimp [Shellfish Allergy]     Angioedema caused by shrimp as well as oysters. EpiPen current  . Amlodipine Besylate     REACTION: edema, fatigue  . Calcium Channel Blockers     REACTION: edema , fatigue  . Cephalexin     REACTION: diarrhea  . Codeine     REACTION: nausea  . Coricidin D Cold-Flu-Sinus [Chlorphen-Pe-Acetaminophen] Hives  . Crestor [Rosuvastatin]     Per patient gave her leg cramps   . Erythromycin Diarrhea  . Hydrochlorothiazide W-Triamterene     REACTION: low potassium  . Morphine Nausea And Vomiting  . Phenylephrine Hcl Hives  . Statins     Leg cramps with Lipitor & Pravachol    Assessment/Plan: Hyperlipidemia: LDL not at goal on most recent panel. She was started on Zetia about 6 weeks ago. Will repeat lipid panel in 4 weeks to determine LDL reduction on Zetia alone. IF remains above goal <100. Will plan to pursue PSCK9i therapy (Repatha) if not at goal given elevated calcium score in 85% for her age and sex. She is comfortable with injectable therapy and agrees with plan. Follow up after next lipid panel.    Thank you,  Samantha Stark, [facility]  10/29/2017 7:43 AM

## 2017-11-01 ENCOUNTER — Ambulatory Visit: Payer: BLUE CROSS/BLUE SHIELD

## 2017-11-01 ENCOUNTER — Other Ambulatory Visit (HOSPITAL_COMMUNITY)
Admission: RE | Admit: 2017-11-01 | Discharge: 2017-11-01 | Disposition: A | Discharge status: Home / Self Care | Payer: BLUE CROSS/BLUE SHIELD | Source: Ambulatory Visit | Attending: Family Medicine | Admitting: Family Medicine

## 2017-11-01 ENCOUNTER — Ambulatory Visit (INDEPENDENT_AMBULATORY_CARE_PROVIDER_SITE_OTHER): Payer: BLUE CROSS/BLUE SHIELD | Admitting: Family Medicine

## 2017-11-01 VITALS — BP 128/78 | HR 71 | Temp 98.1°F | Ht 62.0 in | Wt 157.4 lb

## 2017-11-01 DIAGNOSIS — F4323 Adjustment disorder with mixed anxiety and depressed mood: Secondary | ICD-10-CM

## 2017-11-01 DIAGNOSIS — Z0184 Encounter for antibody response examination: Secondary | ICD-10-CM | POA: Diagnosis not present

## 2017-11-01 DIAGNOSIS — Z0000 Encounter for general adult medical examination without abnormal findings: Secondary | ICD-10-CM | POA: Diagnosis not present

## 2017-11-01 DIAGNOSIS — I10 Essential (primary) hypertension: Secondary | ICD-10-CM | POA: Diagnosis not present

## 2017-11-01 DIAGNOSIS — Z01419 Encounter for gynecological examination (general) (routine) without abnormal findings: Secondary | ICD-10-CM

## 2017-11-01 DIAGNOSIS — E782 Mixed hyperlipidemia: Secondary | ICD-10-CM

## 2017-11-01 LAB — CBC WITH DIFFERENTIAL/PLATELET
BASOS PCT: 1.1 % (ref 0.0–3.0)
Basophils Absolute: 0.1 10*3/uL (ref 0.0–0.1)
EOS ABS: 0.1 10*3/uL (ref 0.0–0.7)
Eosinophils Relative: 2.3 % (ref 0.0–5.0)
HEMATOCRIT: 40 % (ref 36.0–46.0)
Hemoglobin: 13.5 g/dL (ref 12.0–15.0)
LYMPHS ABS: 1.4 10*3/uL (ref 0.7–4.0)
LYMPHS PCT: 28.8 % (ref 12.0–46.0)
MCHC: 33.8 g/dL (ref 30.0–36.0)
MCV: 89.4 fl (ref 78.0–100.0)
Monocytes Absolute: 0.4 10*3/uL (ref 0.1–1.0)
Monocytes Relative: 8.9 % (ref 3.0–12.0)
NEUTROS ABS: 2.9 10*3/uL (ref 1.4–7.7)
NEUTROS PCT: 58.9 % (ref 43.0–77.0)
PLATELETS: 239 10*3/uL (ref 150.0–400.0)
RBC: 4.48 Mil/uL (ref 3.87–5.11)
RDW: 13.5 % (ref 11.5–15.5)
WBC: 4.9 10*3/uL (ref 4.0–10.5)

## 2017-11-01 LAB — COMPREHENSIVE METABOLIC PANEL
ALT: 19 U/L (ref 0–35)
AST: 22 U/L (ref 0–37)
Albumin: 4.2 g/dL (ref 3.5–5.2)
Alkaline Phosphatase: 57 U/L (ref 39–117)
BUN: 14 mg/dL (ref 6–23)
CALCIUM: 10.1 mg/dL (ref 8.4–10.5)
CHLORIDE: 102 meq/L (ref 96–112)
CO2: 30 meq/L (ref 19–32)
Creatinine, Ser: 0.64 mg/dL (ref 0.40–1.20)
GFR: 99.84 mL/min (ref >60.00)
Glucose, Bld: 80 mg/dL (ref 70–99)
Potassium: 3.6 mEq/L (ref 3.5–5.1)
Sodium: 139 mEq/L (ref 135–145)
Total Bilirubin: 0.6 mg/dL (ref 0.2–1.2)
Total Protein: 6.6 g/dL (ref 6.0–8.3)

## 2017-11-01 NOTE — Assessment & Plan Note (Signed)
Management per cards

## 2017-11-01 NOTE — Progress Notes (Signed)
Subjective:   Patient ID: Samantha Stark, female    DOB: May 11, 1956, 62 y.o.   MRN: [id number]  Samantha Stark is a pleasant 62 y.o. year old female who presents to clinic today with Annual Exam (Patient is here today for a CPE with PAP. She is not currently fasting.  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  Her work is going to pay for these labs. Will have Lipid drawn at [facility] on 2.15.19.)  on 11/01/2017  HPI:  Established care with me last month.  Remote h/o hysterectomy but she is unsure if she still has a cervix.  Health Maintenance  Topic Date Due  . DEXA SCAN  04/26/2018 (Originally 06/02/2016)  . MAMMOGRAM  04/15/2018  . PAP SMEAR  04/18/2019  . COLONOSCOPY  03/20/2021  . TETANUS/TDAP  08/12/2021  . INFLUENZA VACCINE  Completed  . Hepatitis C Screening  Completed  . HIV Screening  Completed   HLD- followed by cardiology.  Last seen on 10/29/17.  Note reviewed. Has been intolerant to several rxs. Currently taking zetia.  Lab Results  Component Value Date   CHOL 273 (H) 04/05/2017   HDL 60 04/05/2017   LDLCALC 201 (H) 04/05/2017   LDLDIRECT 139.1 05/12/2013   TRIG 60 04/05/2017   CHOLHDL 4.6 (H) 04/05/2017   Lab Results  Component Value Date   ALT 16 06/23/2016   AST 22 06/23/2016   ALKPHOS 57 06/23/2016   BILITOT 0.4 06/23/2016   BP has been well controlled with Lisinopril 20 mg daily.  Takes spironolactone as needed for edema.  Palpitations- has not Toprol XL 25 mg as needed but has not needed this in over year.  Anxiety- restarted lexapro and feels this is working well. Lab Results  Component Value Date   WBC 5.1 06/23/2016   HGB 13.8 06/23/2016   HCT 40.9 06/23/2016   MCV 88.9 06/23/2016   PLT 228 06/23/2016   Lab Results  Component Value Date   TSH 3.70 10/06/2017   Current Outpatient Medications on File Prior to Visit  Medication Sig Dispense Refill  . aspirin EC 81 MG  tablet Take 1 tablet (81 mg total) by mouth daily. 90 tablet 3  . cyclobenzaprine (FLEXERIL) 5 MG tablet Take 5-10 mg by mouth 3 (three) times daily as needed for muscle spasms. Reported on 03/04/2016    . EPINEPHrine 0.3 mg/0.3 mL IJ SOAJ injection Inject 0.3 mg into the muscle as directed. Reported on 03/04/2016    . escitalopram (LEXAPRO) 10 MG tablet Take 1 tablet (10 mg total) by mouth daily. 30 tablet 6  . ezetimibe (ZETIA) 10 MG tablet Take 1 tablet (10 mg total) by mouth daily. 90 tablet 3  . lisinopril (PRINIVIL,ZESTRIL) 20 MG tablet Take 1 tablet (20 mg total) by mouth daily. 90 tablet 3  . metoprolol succinate (TOPROL-XL) 25 MG 24 hr tablet Take 1 tablet (25 mg total) by mouth daily. 90 tablet 3  . Nutritional Supplements (JUICE PLUS FIBRE PO) Take by mouth daily.    . propranolol (INDERAL) 10 MG tablet Take one tablet by mouth as needed for palpitations up to 3 per day and at least 6 hours apart. 90 tablet 1  . spironolactone (ALDACTONE) 25 MG tablet Take 25 mg by mouth daily as needed (fluid).     No current facility-administered medications on file prior to visit.     Allergies  Allergen Reactions  . Iodine  Anaphylaxis  . Shrimp [Shellfish Allergy]     Angioedema caused by shrimp as well as oysters. EpiPen current  . Amlodipine Besylate     REACTION: edema, fatigue  . Calcium Channel Blockers     REACTION: edema , fatigue  . Cephalexin     REACTION: diarrhea  . Codeine     REACTION: nausea  . Coricidin D Cold-Flu-Sinus [Chlorphen-Pe-Acetaminophen] Hives  . Crestor [Rosuvastatin]     Per patient gave her leg cramps   . Erythromycin Diarrhea  . Hydrochlorothiazide W-Triamterene     REACTION: low potassium  . Morphine Nausea And Vomiting  . Phenylephrine Hcl Hives  . Statins     Leg cramps with Lipitor & Pravachol    Past Medical History:  Diagnosis Date  . Allergy   . Anxiety    Situational  . Cancer ([place])    scalp  . Chest pain 2006 & 2008   Negative nuclear  stress study, Samantha Stark  . Diverticulosis   . Hyperlipidemia   . Hypertension   . MVP (mitral valve prolapse)   . Seasonal allergies     Past Surgical History:  Procedure Laterality Date  . ABDOMINAL HYSTERECTOMY    . COLONOSCOPY  2008 & 2011   [facility], negative except for Tics  . KNEE ARTHROSCOPY Bilateral 2002, 2007  . PARTIAL HYSTERECTOMY  1993  . ROTATOR CUFF REPAIR Right 2010  . SPINE SURGERY  1992   Lumbar laminectomy L5-S1  . TONSILLECTOMY  1976  . TUBAL LIGATION      Family History  Problem Relation Age of Onset  . Heart attack Maternal Uncle        MI in 41s  . Colon cancer Maternal Uncle 29  . Osteoporosis Maternal Grandmother   . Diabetes Maternal Grandmother   . Colon polyps Mother   . Hyperlipidemia Mother   . Goiter Mother        also Samantha Stark  . Asthma Mother   . Colon polyps Brother   . Hyperlipidemia Brother        X2  . Colon polyps Maternal Aunt   . Diabetes Maternal Aunt        X2  . Alcohol abuse Father   . Hyperlipidemia Sister   . Stroke Maternal Grandfather        in 59s  . Alzheimer's disease Maternal Aunt   . Alcohol abuse Paternal Aunt   . Hyperlipidemia Paternal Aunt   . Alcohol abuse Paternal Uncle   . Diabetes Paternal Grandmother   . Hyperlipidemia Brother     Social History   Socioeconomic History  . Marital status: Married    Spouse name: Not on file  . Number of children: Not on file  . Years of education: Not on file  . Highest education level: Not on file  Social Needs  . Financial resource strain: Not on file  . Food insecurity - worry: Not on file  . Food insecurity - inability: Not on file  . Transportation needs - medical: Not on file  . Transportation needs - non-medical: Not on file  Occupational History  . Occupation: Surveyor, mining: [employer]  Tobacco Use  . Smoking status: Never Smoker  . Smokeless tobacco: Never Used  Substance and Sexual Activity  . Alcohol use: Yes     Alcohol/week: 1.8 oz    Types: 3 Glasses of wine per week  . Drug use: No  . Sexual activity: Yes  Partners: Male    Birth control/protection: Surgical  Other Topics Concern  . Not on file  Social History Narrative   Married.   2 children, 6 grandchildren.   Regular exercise: yes   Caffeine use: daily   Enjoys reading, gardening, sports, teaching yoga.   The PMH, PSH, Social History, Family History, Medications, and allergies have been reviewed in [facility], and have been updated if relevant.     Review of Systems  Constitutional: Negative.   HENT: Negative.   Eyes: Negative.   Respiratory: Negative.   Cardiovascular: Negative.   Gastrointestinal: Negative.   Endocrine: Negative.   Genitourinary: Negative.   Musculoskeletal: Negative.   Skin: Negative.   Allergic/Immunologic: Negative.   Neurological: Negative.   Hematological: Negative.   Psychiatric/Behavioral: Negative.   All other systems reviewed and are negative.      Objective:    BP 128/78 (BP Location: Left Arm, Patient Position: Sitting, Cuff Size: Normal)   Pulse 71   Temp 98.1 F (36.7 C) (Oral)   Ht '5\' 2"'  (1.575 m)   Wt 157 lb 6.4 oz (71.4 kg)   SpO2 96%   BMI 28.79 kg/m    Physical Exam   General:  Well-developed,well-nourished,in no acute distress; alert,appropriate and cooperative throughout examination Head:  normocephalic and atraumatic.   Eyes:  vision grossly intact, PERRL Ears:  R ear normal and L ear normal externally, TMs clear bilaterally Nose:  no external deformity.   Mouth:  good dentition.   Neck:  No deformities, masses, or tenderness noted. Breasts:  No mass, nodules, thickening, tenderness, bulging, retraction, inflamation, nipple discharge or skin changes noted.   Lungs:  Normal respiratory effort, chest expands symmetrically. Lungs are clear to auscultation, no crackles or wheezes. Heart:  Normal rate and regular rhythm. S1 and S2 normal without gallop, murmur, click, rub or  other extra sounds. Abdomen:  Bowel sounds positive,abdomen soft and non-tender without masses, organomegaly or hernias noted. Rectal:  no external abnormalities.   Genitalia:  Pelvic Exam:        External: normal female genitalia without lesions or masses        Vagina: normal without lesions or masses        Cervix: absent        Adnexa: normal bimanual exam without masses or fullness        Uterus: normal by palpation        Pap smear: performed Msk:  No deformity or scoliosis noted of thoracic or lumbar spine.   Extremities:  No clubbing, cyanosis, edema, or deformity noted with normal full range of motion of all joints.   Neurologic:  alert & oriented X3 and gait normal.   Skin:  Intact without suspicious lesions or rashes Cervical Nodes:  No lymphadenopathy noted Axillary Nodes:  No palpable lymphadenopathy Psych:  Cognition and judgment appear intact. Alert and cooperative with normal attention span and concentration. No apparent delusions, illusions, hallucinations      Assessment & Plan:   Well woman exam with routine gynecological exam - Plan: Cytology - PAP, CBC with Differential/Platelet  Essential hypertension  Adjustment disorder   HYPERLIPIDEMIA- mgt per Cards - Plan: Comprehensive metabolic panel, CANCELED: TSH, CANCELED: Lipid panel  Immunity status testing - Plan: QuantiFERON-TB Gold Plus, Hepatitis B surface antibody, Varicella zoster antibody, IgG, Measles/Mumps/Rubella Immunity No Follow-up on file.

## 2017-11-01 NOTE — Assessment & Plan Note (Signed)
Reviewed preventive care protocols, scheduled due services, and updated immunizations Discussed nutrition, exercise, diet, and healthy lifestyle.  Pap done today as she was unsure if she still has a cervix.  She does not so does not need further pap smears.  Orders Placed This Encounter  Procedures  . QuantiFERON-TB Gold Plus  . Hepatitis B surface antibody  . Varicella zoster antibody, IgG  . Measles/Mumps/Rubella Immunity  . CBC with Differential/Platelet  . Comprehensive metabolic panel

## 2017-11-01 NOTE — Patient Instructions (Signed)
Great to see you. I will call you with your lab results from today and you can view them online.   

## 2017-11-01 NOTE — Assessment & Plan Note (Signed)
Well controlled 

## 2017-11-03 LAB — QUANTIFERON-TB GOLD PLUS
Mitogen-NIL: >10 IU/mL
NIL: 0.22 [IU]/mL
QUANTIFERON-TB GOLD PLUS: POSITIVE — AB
TB1-NIL: 0.11 IU/mL
TB2-NIL: 1.18 [IU]/mL

## 2017-11-03 LAB — MEASLES/MUMPS/RUBELLA IMMUNITY
Mumps IgG: 104 AU/mL
RUBELLA: 21.9 {index}
Rubeola IgG: >300 AU/mL

## 2017-11-03 LAB — VARICELLA ZOSTER ANTIBODY, IGG: Varicella IgG: 3236 index

## 2017-11-03 LAB — HEPATITIS B SURFACE ANTIBODY, QUANTITATIVE

## 2017-11-04 ENCOUNTER — Encounter: Payer: BLUE CROSS/BLUE SHIELD | Admitting: Family Medicine

## 2017-11-04 ENCOUNTER — Telehealth: Payer: Self-pay

## 2017-11-04 DIAGNOSIS — R7611 Nonspecific reaction to tuberculin skin test without active tuberculosis: Secondary | ICD-10-CM

## 2017-11-04 LAB — CYTOLOGY - PAP
BACTERIAL VAGINITIS: NEGATIVE
Candida vaginitis: NEGATIVE
Diagnosis: NEGATIVE
HPV: NOT DETECTED

## 2017-11-04 NOTE — Telephone Encounter (Signed)
-----   Message from Lucille Passy, MD sent at 11/03/2017  5:26 PM EST ----- Yes okay to have both.  Thank you. ----- Message ----- From: Marrion Coy, CMA Sent: 11/03/2017   4:37 PM To: Lucille Passy, MD  TA-Her work suggested that she get a CXR/also she needs to restart Hep-B series/is requesting to get the CXR and 1st vaccine on Friday 2.1.19/Plz advise/thx dmf

## 2017-11-08 ENCOUNTER — Encounter: Payer: Self-pay | Admitting: Family Medicine

## 2017-11-08 ENCOUNTER — Other Ambulatory Visit: Payer: Self-pay

## 2017-11-08 NOTE — Telephone Encounter (Signed)
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

## 2017-11-08 NOTE — Telephone Encounter (Signed)
Letter created with fax coversheet/will fax per pt req when signed/message sent to pt stating this/thx dmf

## 2017-11-15 ENCOUNTER — Ambulatory Visit (INDEPENDENT_AMBULATORY_CARE_PROVIDER_SITE_OTHER): Payer: BLUE CROSS/BLUE SHIELD

## 2017-11-15 ENCOUNTER — Ambulatory Visit (INDEPENDENT_AMBULATORY_CARE_PROVIDER_SITE_OTHER): Payer: BLUE CROSS/BLUE SHIELD | Admitting: Behavioral Health

## 2017-11-15 ENCOUNTER — Other Ambulatory Visit: Payer: BLUE CROSS/BLUE SHIELD

## 2017-11-15 DIAGNOSIS — Z23 Encounter for immunization: Secondary | ICD-10-CM | POA: Diagnosis not present

## 2017-11-15 DIAGNOSIS — R7611 Nonspecific reaction to tuberculin skin test without active tuberculosis: Secondary | ICD-10-CM

## 2017-11-15 DIAGNOSIS — R05 Cough: Secondary | ICD-10-CM | POA: Diagnosis not present

## 2017-11-15 NOTE — Progress Notes (Signed)
Patient came in clinic today for Hepatitis B vaccination. RN received verbal order from Dr. Deborra Medina to start Hepatitis B series. IM injection was given in the right deltoid. Patient tolerated the injection well. No s/s of a reaction prior to patient leaving the nurse visit. Next appointment 01/13/18 at 10:00 AM for Hepatitis B injection #2.

## 2017-11-16 ENCOUNTER — Ambulatory Visit: Payer: BLUE CROSS/BLUE SHIELD

## 2017-11-24 ENCOUNTER — Encounter: Payer: Self-pay | Admitting: Cardiovascular Disease

## 2017-11-26 ENCOUNTER — Other Ambulatory Visit: Payer: BLUE CROSS/BLUE SHIELD

## 2018-01-11 DIAGNOSIS — M2041 Other hammer toe(s) (acquired), right foot: Secondary | ICD-10-CM | POA: Diagnosis not present

## 2018-01-11 DIAGNOSIS — M205X1 Other deformities of toe(s) (acquired), right foot: Secondary | ICD-10-CM | POA: Diagnosis not present

## 2018-01-11 DIAGNOSIS — M7741 Metatarsalgia, right foot: Secondary | ICD-10-CM | POA: Diagnosis not present

## 2018-01-11 DIAGNOSIS — M216X1 Other acquired deformities of right foot: Secondary | ICD-10-CM | POA: Diagnosis not present

## 2018-01-11 DIAGNOSIS — G8918 Other acute postprocedural pain: Secondary | ICD-10-CM | POA: Diagnosis not present

## 2018-01-13 ENCOUNTER — Ambulatory Visit: Payer: BLUE CROSS/BLUE SHIELD

## 2018-01-13 DIAGNOSIS — Z4789 Encounter for other orthopedic aftercare: Secondary | ICD-10-CM | POA: Diagnosis not present

## 2018-01-14 ENCOUNTER — Telehealth: Payer: Self-pay

## 2018-01-14 NOTE — Telephone Encounter (Signed)
I tried to call pt/no answer/It will be fine to get the 2nd Hep-B shot on 4.16.19 and ok for PEC to give this information if she calls back/thx dmf   Copied from [place] [id number]. Topic: Appointment Scheduling - Scheduling Inquiry for Clinic >> Jan 14, 2018 12:58 PM Bea Graff, NT wrote: Reason for CRM: Pt missed her appt yesterday for the 2 dose of Hep B vaccine and will not be able to get the next one until 01/25/18 due to scheduling issues as well as she will be out of town until the 15th. She wants to see if that is to long in between or if this will be ok. Please call pt.

## 2018-01-24 ENCOUNTER — Encounter: Payer: Self-pay | Admitting: Cardiovascular Disease

## 2018-01-25 ENCOUNTER — Ambulatory Visit (INDEPENDENT_AMBULATORY_CARE_PROVIDER_SITE_OTHER): Payer: BLUE CROSS/BLUE SHIELD | Admitting: *Deleted

## 2018-01-25 DIAGNOSIS — E782 Mixed hyperlipidemia: Secondary | ICD-10-CM

## 2018-01-25 DIAGNOSIS — Z23 Encounter for immunization: Secondary | ICD-10-CM | POA: Diagnosis not present

## 2018-01-25 NOTE — Progress Notes (Signed)
Patient was here today for her 2nd Hep B was given in her left deltoid. She tolerated well.

## 2018-01-26 LAB — HEPATIC FUNCTION PANEL
ALBUMIN: 4.2 g/dL (ref 3.6–4.8)
ALK PHOS: 56 IU/L (ref 39–117)
ALT: 21 IU/L (ref 0–32)
AST: 21 IU/L (ref 0–40)
BILIRUBIN TOTAL: 0.3 mg/dL (ref 0.0–1.2)
BILIRUBIN, DIRECT: 0.08 mg/dL (ref 0.00–0.40)
Total Protein: 6.7 g/dL (ref 6.0–8.5)

## 2018-01-26 LAB — LIPID PANEL
CHOL/HDL RATIO: 3.9 ratio (ref 0.0–4.4)
Cholesterol, Total: 249 mg/dL — ABNORMAL HIGH (ref 100–199)
HDL: 64 mg/dL (ref >39)
LDL Calculated: 168 mg/dL — ABNORMAL HIGH (ref 0–99)
Triglycerides: 84 mg/dL (ref 0–149)
VLDL Cholesterol Cal: 17 mg/dL (ref 5–40)

## 2018-02-01 ENCOUNTER — Other Ambulatory Visit: Payer: Self-pay

## 2018-02-01 ENCOUNTER — Encounter: Payer: Self-pay | Admitting: Family Medicine

## 2018-02-01 MED ORDER — SPIRONOLACTONE 25 MG PO TABS: 25.0000 mg | ORAL_TABLET | Freq: Every day | ORAL | 2 refills | Status: DC | PRN

## 2018-02-22 ENCOUNTER — Encounter: Payer: Self-pay | Admitting: Family Medicine

## 2018-02-22 DIAGNOSIS — Z1239 Encounter for other screening for malignant neoplasm of breast: Secondary | ICD-10-CM

## 2018-02-22 DIAGNOSIS — M8589 Other specified disorders of bone density and structure, multiple sites: Secondary | ICD-10-CM

## 2018-02-23 DIAGNOSIS — M2041 Other hammer toe(s) (acquired), right foot: Secondary | ICD-10-CM | POA: Diagnosis not present

## 2018-03-28 ENCOUNTER — Other Ambulatory Visit: Payer: BLUE CROSS/BLUE SHIELD

## 2018-04-08 DIAGNOSIS — M79671 Pain in right foot: Secondary | ICD-10-CM | POA: Diagnosis not present

## 2018-05-16 ENCOUNTER — Ambulatory Visit
Admission: RE | Admit: 2018-05-16 | Discharge: 2018-05-16 | Disposition: A | Discharge status: Home / Self Care | Payer: BLUE CROSS/BLUE SHIELD | Source: Ambulatory Visit | Attending: Family Medicine | Admitting: Family Medicine

## 2018-05-16 DIAGNOSIS — Z780 Asymptomatic menopausal state: Secondary | ICD-10-CM | POA: Insufficient documentation

## 2018-05-16 DIAGNOSIS — Z13820 Encounter for screening for osteoporosis: Secondary | ICD-10-CM | POA: Diagnosis not present

## 2018-05-16 DIAGNOSIS — M85851 Other specified disorders of bone density and structure, right thigh: Secondary | ICD-10-CM | POA: Diagnosis not present

## 2018-05-16 DIAGNOSIS — M8589 Other specified disorders of bone density and structure, multiple sites: Secondary | ICD-10-CM

## 2018-05-16 DIAGNOSIS — Z1231 Encounter for screening mammogram for malignant neoplasm of breast: Secondary | ICD-10-CM | POA: Diagnosis not present

## 2018-05-16 DIAGNOSIS — Z1239 Encounter for other screening for malignant neoplasm of breast: Secondary | ICD-10-CM

## 2018-05-17 ENCOUNTER — Encounter: Payer: Self-pay | Admitting: Family Medicine

## 2018-05-20 ENCOUNTER — Other Ambulatory Visit: Payer: Self-pay

## 2018-05-20 DIAGNOSIS — I10 Essential (primary) hypertension: Secondary | ICD-10-CM

## 2018-05-20 MED ORDER — EZETIMIBE 10 MG PO TABS: 10.0000 mg | ORAL_TABLET | Freq: Every day | ORAL | 3 refills | Status: DC

## 2018-05-20 MED ORDER — LISINOPRIL 20 MG PO TABS
20.0000 mg | ORAL_TABLET | Freq: Every day | ORAL | 3 refills | Status: DC
Start: 2018-05-20 — End: 2019-04-12

## 2018-09-06 DIAGNOSIS — L570 Actinic keratosis: Secondary | ICD-10-CM | POA: Diagnosis not present

## 2018-09-06 DIAGNOSIS — D225 Melanocytic nevi of trunk: Secondary | ICD-10-CM | POA: Diagnosis not present

## 2018-09-06 DIAGNOSIS — L718 Other rosacea: Secondary | ICD-10-CM | POA: Diagnosis not present

## 2018-09-06 DIAGNOSIS — L821 Other seborrheic keratosis: Secondary | ICD-10-CM | POA: Diagnosis not present

## 2018-09-20 ENCOUNTER — Ambulatory Visit: Payer: BLUE CROSS/BLUE SHIELD | Admitting: Family Medicine

## 2018-09-20 ENCOUNTER — Ambulatory Visit (INDEPENDENT_AMBULATORY_CARE_PROVIDER_SITE_OTHER): Payer: BLUE CROSS/BLUE SHIELD | Admitting: Family Medicine

## 2018-09-20 VITALS — BP 150/80 | HR 90 | Temp 97.7°F | Ht 62.0 in | Wt 164.4 lb

## 2018-09-20 DIAGNOSIS — J069 Acute upper respiratory infection, unspecified: Secondary | ICD-10-CM | POA: Diagnosis not present

## 2018-09-20 MED ORDER — AZITHROMYCIN 250 MG PO TABS: ORAL_TABLET | ORAL | 0 refills | Status: DC

## 2018-09-20 NOTE — Patient Instructions (Signed)
Good to see you  Please try things such as zyrtec-D or allegra-D which is an antihistamine and decongestant.   Please try afrin which will help with nasal congestion but use for only three days.   Please also try using a netti pot on a regular occasion.  Honey can help with a sore throat.   You can try vick's vapor rub, a humidifier, or lozenges.   Please follow up if your symptoms don't seem to be improving after 7 days or so.

## 2018-09-20 NOTE — Assessment & Plan Note (Addendum)
Likely viral in nature. Has had sick contacts  - azithro to have on hand  -Counseled on supportive care. -Given indications to follow-up.

## 2018-09-20 NOTE — Progress Notes (Signed)
Samantha Stark - 62 y.o. female MRN [id number]  Date of birth: [birth year]  SUBJECTIVE:  Including CC & ROS.  Chief Complaint  Patient presents with  . Cough    congestion , lost voice , productive cough     Samantha Stark is a 62 y.o. female that is presenting with sinus pressure, rhinorrhea, and malaise.  She denies any fevers.  She has been traveling recently.  She has been around other people similar symptoms.  Has not had any improvement with therapies to date.  Has not received a flu vaccine.   Review of Systems  Constitutional: Negative for fever.  HENT: Positive for congestion, rhinorrhea and sinus pressure.   Respiratory: Negative for shortness of breath.   Cardiovascular: Negative for chest pain.  Gastrointestinal: Negative for abdominal distention.    HISTORY: Past Medical, Surgical, Social, and Family History Reviewed & Updated per EMR.   Pertinent Historical Findings include:  Past Medical History:  Diagnosis Date  . Allergy   . Anxiety    Situational  . Cancer ([place])    scalp  . Chest pain 2006 & 2008   Negative nuclear stress study, Dr. [name]  . Diverticulosis   . Hyperlipidemia   . Hypertension   . MVP (mitral valve prolapse)   . Seasonal allergies     Past Surgical History:  Procedure Laterality Date  . ABDOMINAL HYSTERECTOMY    . COLONOSCOPY  2008 & 2011   [facility], negative except for Tics  . KNEE ARTHROSCOPY Bilateral 2002, 2007  . PARTIAL HYSTERECTOMY  1993  . ROTATOR CUFF REPAIR Right 2010  . SPINE SURGERY  1992   Lumbar laminectomy L5-S1  . TONSILLECTOMY  1976  . TUBAL LIGATION      Allergies  Allergen Reactions  . Iodine Anaphylaxis  . Shrimp [Shellfish Allergy]     Angioedema caused by shrimp as well as oysters. EpiPen current  . Amlodipine Besylate     REACTION: edema, fatigue  . Calcium Channel Blockers     REACTION: edema , fatigue  . Cephalexin     REACTION: diarrhea  . Codeine     REACTION: nausea  . Coricidin D  Cold-Flu-Sinus [Chlorphen-Pe-Acetaminophen] Hives  . Crestor [Rosuvastatin]     Per patient gave her leg cramps   . Erythromycin Diarrhea  . Hydrochlorothiazide W-Triamterene     REACTION: low potassium  . Morphine Nausea And Vomiting  . Phenylephrine Hcl Hives  . Statins     Leg cramps with Lipitor & Pravachol    Family History  Problem Relation Age of Onset  . Heart attack Maternal Uncle        MI in 69s  . Colon cancer Maternal Uncle 35  . Osteoporosis Maternal Grandmother   . Diabetes Maternal Grandmother   . Colon polyps Mother   . Hyperlipidemia Mother   . Goiter Mother        also Rosalee Kaufman  . Asthma Mother   . Colon polyps Brother   . Hyperlipidemia Brother        X2  . Colon polyps Maternal Aunt   . Diabetes Maternal Aunt        X2  . Alcohol abuse Father   . Hyperlipidemia Sister   . Stroke Maternal Grandfather        in 42s  . Alzheimer's disease Maternal Aunt   . Alcohol abuse Paternal Aunt   . Hyperlipidemia Paternal Aunt   . Alcohol abuse Paternal Uncle   .  Diabetes Paternal Grandmother   . Hyperlipidemia Brother      Social History   Socioeconomic History  . Marital status: Married    Spouse name: Not on file  . Number of children: Not on file  . Years of education: Not on file  . Highest education level: Not on file  Occupational History  . Occupation: Surveyor, mining: [employer]  Social Needs  . Financial resource strain: Not on file  . Food insecurity:    Worry: Not on file    Inability: Not on file  . Transportation needs:    Medical: Not on file    Non-medical: Not on file  Tobacco Use  . Smoking status: Never Smoker  . Smokeless tobacco: Never Used  Substance and Sexual Activity  . Alcohol use: Yes    Alcohol/week: 3.0 standard drinks    Types: 3 Glasses of wine per week  . Drug use: No  . Sexual activity: Yes    Partners: Male    Birth control/protection: Surgical  Lifestyle  . Physical activity:     Days per week: Not on file    Minutes per session: Not on file  . Stress: Not on file  Relationships  . Social connections:    Talks on phone: Not on file    Gets together: Not on file    Attends religious service: Not on file    Active member of club or organization: Not on file    Attends meetings of clubs or organizations: Not on file    Relationship status: Not on file  . Intimate partner violence:    Fear of current or ex partner: Not on file    Emotionally abused: Not on file    Physically abused: Not on file    Forced sexual activity: Not on file  Other Topics Concern  . Not on file  Social History Narrative   Married.   2 children, 6 grandchildren.   Regular exercise: yes   Caffeine use: daily   Enjoys reading, gardening, sports, teaching yoga.     PHYSICAL EXAM:  VS: BP (!) 150/80 (BP Location: Right Arm, Patient Position: Sitting, Cuff Size: Normal)   Pulse 90   Temp 97.7 F (36.5 C) (Oral)   Ht 5\' 2"  (1.575 m)   Wt 164 lb 6.4 oz (74.6 kg)   SpO2 97%   BMI 30.07 kg/m  Physical Exam Gen: NAD, alert, cooperative with exam,  ENT: normal lips, normal nasal mucosa, tympanic membranes clear and intact bilaterally, normal oropharynx, no cervical lymphadenopathy Eye: normal EOM, normal conjunctiva and lids CV:  no edema, +2 pedal pulses, regular rate and rhythm, S1-S2   Resp: no accessory muscle use, non-labored, clear to auscultation bilaterally, no crackles or wheezes Skin: no rashes, no areas of induration  Neuro: normal tone, normal sensation to touch Psych:  normal insight, alert and oriented MSK: Normal gait, normal strength       ASSESSMENT & PLAN:   Upper respiratory tract infection Likely viral in nature. Has had sick contacts  - azithro to have on hand  -Counseled on supportive care. -Given indications to follow-up.

## 2018-09-27 DIAGNOSIS — H11442 Conjunctival cysts, left eye: Secondary | ICD-10-CM | POA: Diagnosis not present

## 2018-09-27 DIAGNOSIS — Z9889 Other specified postprocedural states: Secondary | ICD-10-CM | POA: Diagnosis not present

## 2018-10-07 NOTE — Progress Notes (Signed)
Patient ID: Samantha Stark, female   DOB: 12/30/1955, 62 y.o.   MRN: [id number]     62 y.o. history of benign palpitations with normal myovue and event monitors normal in past. Some related to travel and job stress with [employer]. Hypothyroid with normal TSH Has HTN and compliant with BP meds   Intolerant to most statins Started on crestor once weekly  LDL before this in July was 201 Could not tolerate and started on Zetia  LDL still 168 01/25/18 and PSK 9 pursued by lipid clinic Patient  Indicates BCBS of [place] would not approve   ETT reviewed from 05/07/17 Normal   6 grandkids  Two here and 4 in [place]  Lab Results  Component Value Date   LDLCALC 168 (H) 01/25/2018    ROS: Depressed and palpitations all other systems reviewed and negative   General: BP (!) 148/92 (BP Location: Left Arm, Patient Position: Sitting, Cuff Size: Normal)   Pulse 83   Ht 5' 1.5" (1.562 m)   Wt 161 lb 1.9 oz (73.1 kg)   BMI 29.95 kg/m  Affect appropriate Healthy:  appears stated age 44: normal Neck supple with no adenopathy JVP normal no bruits no thyromegaly Lungs clear with no wheezing and good diaphragmatic motion Heart:  S1/S2 no murmur, no rub, gallop or click PMI normal Abdomen: benighn, BS positve, no tenderness, no AAA no bruit.  No HSM or HJR Distal pulses intact with no bruits No edema Neuro non-focal Skin warm and dry No muscular weakness     Current Outpatient Medications  Medication Sig Dispense Refill  . aspirin EC 81 MG tablet Take 1 tablet (81 mg total) by mouth daily. 90 tablet 3  . cyclobenzaprine (FLEXERIL) 5 MG tablet Take 5-10 mg by mouth 3 (three) times daily as needed for muscle spasms. Reported on 03/04/2016    . EPINEPHrine 0.3 mg/0.3 mL IJ SOAJ injection Inject 0.3 mg into the muscle as directed. Reported on 03/04/2016    . lisinopril (PRINIVIL,ZESTRIL) 20 MG tablet Take 1 tablet (20 mg total) by mouth daily. 90 tablet 3  . metoprolol succinate  (TOPROL-XL) 25 MG 24 hr tablet Take 25 mg by mouth as needed (palpitations).    . Nutritional Supplements (JUICE PLUS FIBRE PO) Take by mouth daily.    . propranolol (INDERAL) 10 MG tablet Take one tablet by mouth as needed for palpitations up to 3 per day and at least 6 hours apart. 90 tablet 1  . spironolactone (ALDACTONE) 25 MG tablet Take 1 tablet (25 mg total) by mouth daily as needed (fluid). 30 tablet 2   No current facility-administered medications for this visit.     Allergies  Iodine; Shrimp [shellfish allergy]; Amlodipine besylate; Calcium channel blockers; Cephalexin; Codeine; Coricidin d cold-flu-sinus [chlorphen-pe-acetaminophen]; Crestor [rosuvastatin]; Erythromycin; Hydrochlorothiazide w-triamterene; Morphine; Phenylephrine hcl; and Statins  Electrocardiogram:  10/10/18 SR rate 83 normal   Assessment and Plan  Palpitations : benign infrequent observe  HTN: Well controlled.  Continue current medications and low sodium Dash type diet.    Elevated Liipids  Has had leg pain with lipitor, pravachol and one other statin On crestor onceWeekly now intolerant  Poor response to zetia with LDL over 160 referred to lipid clinic for PSK-9  Calcium score July 2017 was 77 which is 52 th percentile for age and sex Will forward to lipid clinic and see if there is anyway to get drug Approved   Chest Pain:  ECG normal  ETT normal on  05/07/17        Jenkins Rouge

## 2018-10-10 ENCOUNTER — Encounter: Payer: Self-pay | Admitting: Cardiovascular Disease

## 2018-10-10 ENCOUNTER — Ambulatory Visit (INDEPENDENT_AMBULATORY_CARE_PROVIDER_SITE_OTHER): Payer: BLUE CROSS/BLUE SHIELD | Admitting: Cardiovascular Disease

## 2018-10-10 VITALS — BP 148/92 | HR 83 | Ht 61.5 in | Wt 161.1 lb

## 2018-10-10 DIAGNOSIS — E782 Mixed hyperlipidemia: Secondary | ICD-10-CM | POA: Diagnosis not present

## 2018-10-10 DIAGNOSIS — I10 Essential (primary) hypertension: Secondary | ICD-10-CM

## 2018-10-10 DIAGNOSIS — R002 Palpitations: Secondary | ICD-10-CM

## 2018-10-10 NOTE — Patient Instructions (Addendum)
Medication Instructions:   If you need a refill on your cardiac medications before your next appointment, please call your pharmacy.   Lab work:  If you have labs (blood work) drawn today and your tests are completely normal, you will receive your results only by: Marland Kitchen MyChart Message (if you have MyChart) OR . A paper copy in the mail If you have any lab test that is abnormal or we need to change your treatment, we will call you to review the results.  Testing/Procedures: NONE ordered today.  Follow-Up: At [facility] you and your health needs are our priority.  As part of our continuing mission to provide you with exceptional heart care, we have created designated Provider Care Teams.  These Care Teams include your primary Cardiologist (physician) and Advanced Practice Providers (APPs -  Physician Assistants and Nurse Practitioners) who all work together to provide you with the care you need, when you need it. You will need a follow up appointment in 12 months.  Please call our office 2 months in advance to schedule this appointment.  You may see Dr. [name] or one of the following Advanced Practice Providers on your designated Care Team:   Truitt Merle, NP Cecilie Kicks, NP . Kathyrn Drown, NP

## 2018-10-25 ENCOUNTER — Telehealth: Payer: Self-pay | Admitting: Pharmacist

## 2018-10-25 NOTE — Telephone Encounter (Signed)
Prior authorization previously submitted for Praluent and denied. Repatha prior authorization attempted, also denied. [employer] of [place] has very strict PCSK9i coverage criteria:   We may cover Repatha (evolocumab) for a confirmed diagnosis of HeFH, HoFH, or in adults with ASCVD, it is indicated to reduce the risk of myocardial infarction, stroke, and coronary revascularization when ALL of the following criteria is met:  Marland Kitchen Member has been evaluated in a lipid program staffed by a board certified cardiologist or endocrinologist . Current (<12 months ago) LDL lab value . Used as adjunct therapy to diet and maximally tolerated statin therapy . Previous treatment failure with 3 statin medications - at least 2 of which are high-potency - in combination with ezetimibe (claims will be verified when there is not a break in coverage) . Previous treatment failure with Praluent - for HeFH and established cardiovascular disease indications only   Pt is primary prevention which is considered an off label use of PCSK9i per her insurance plan unfortunately. Will try pt on bempedoic acid once this is FDA approved in Feb-March of this year given multitude of previous statin intolerances.

## 2018-10-31 NOTE — Telephone Encounter (Signed)
Pt would like to start red yeast rice and recheck lipids before starting bempedoic acid when it is available. She experienced myalgias on Zetia as well, so will try pt on bempedoic acid monotherapy.

## 2018-11-05 IMAGING — MG MM DIGITAL SCREENING BILAT W/ TOMO W/ CAD
5 series · 6 of 17 positions shown · non-contrast
Comparison: None.

CLINICAL DATA: Screening.

EXAM:
DIGITAL SCREENING BILATERAL MAMMOGRAM WITH TOMO AND CAD

[R MLO synth-2D]
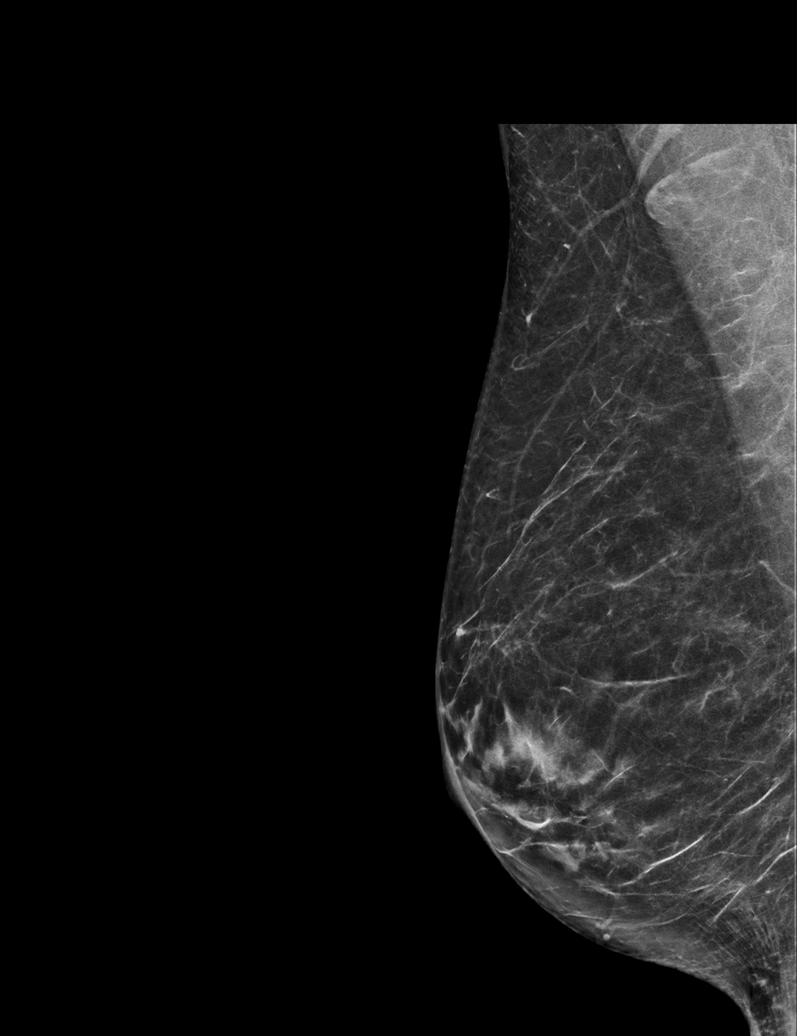

[L MLO synth-2D]
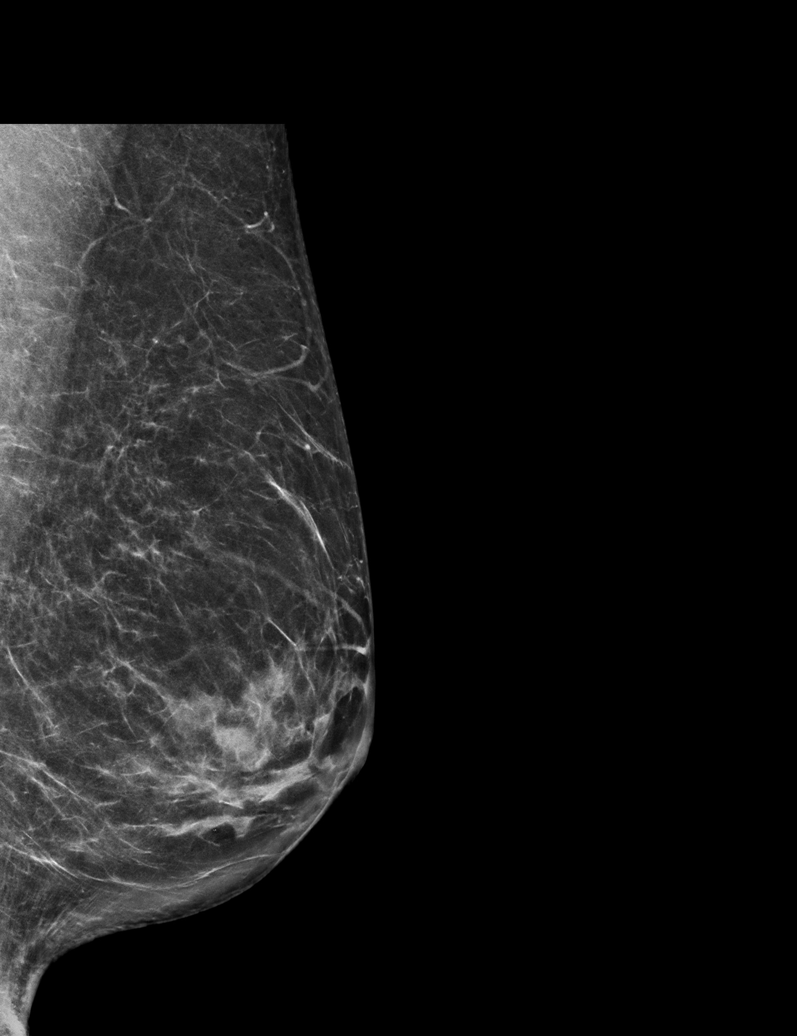

[L CC tomo · 2 of 67 frames shown]
[frame 22/67]
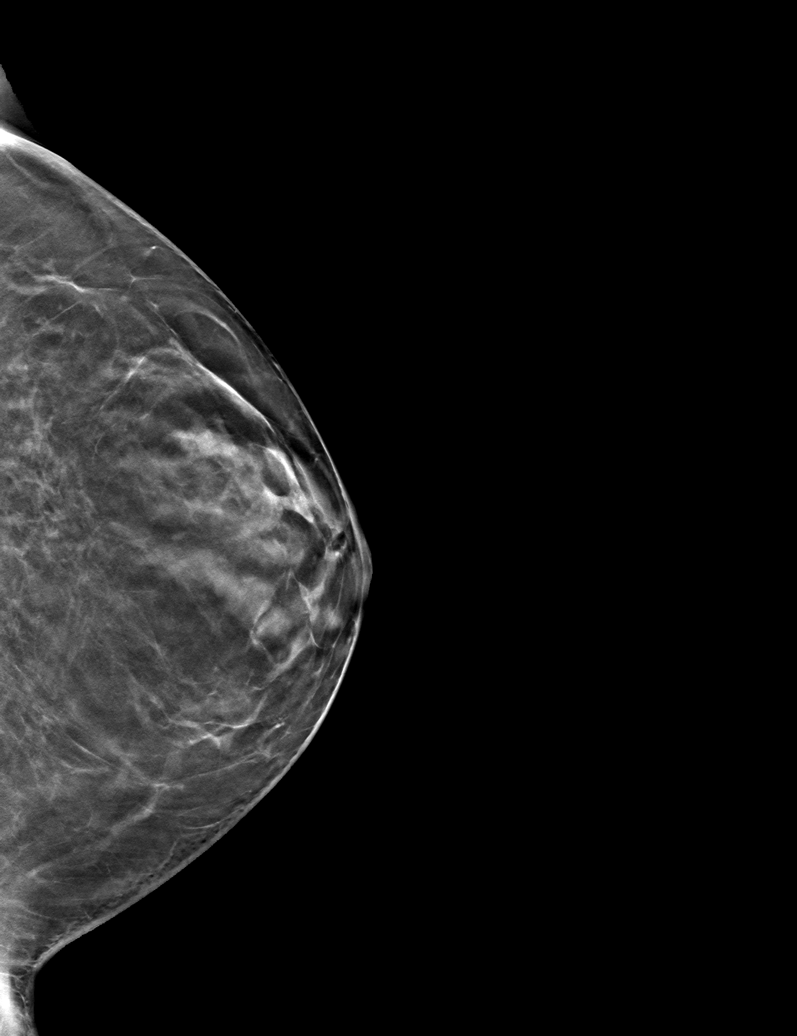
[frame 34/67]
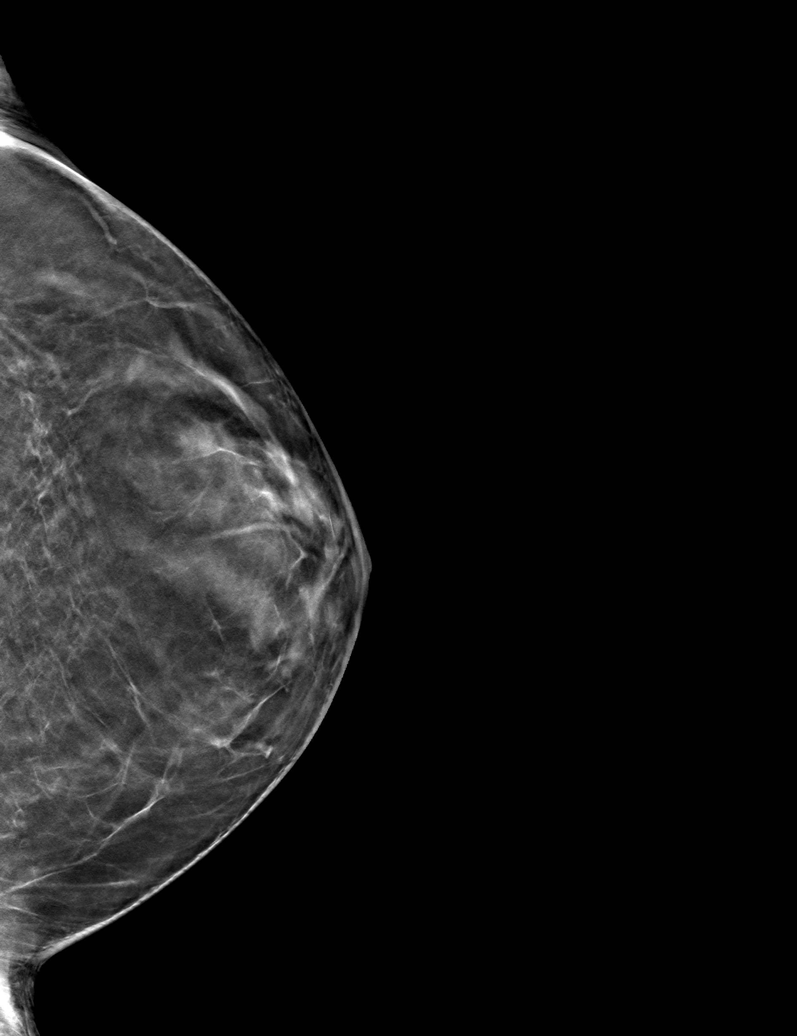

[L MLO tomo · tomo slice 33/66.0]
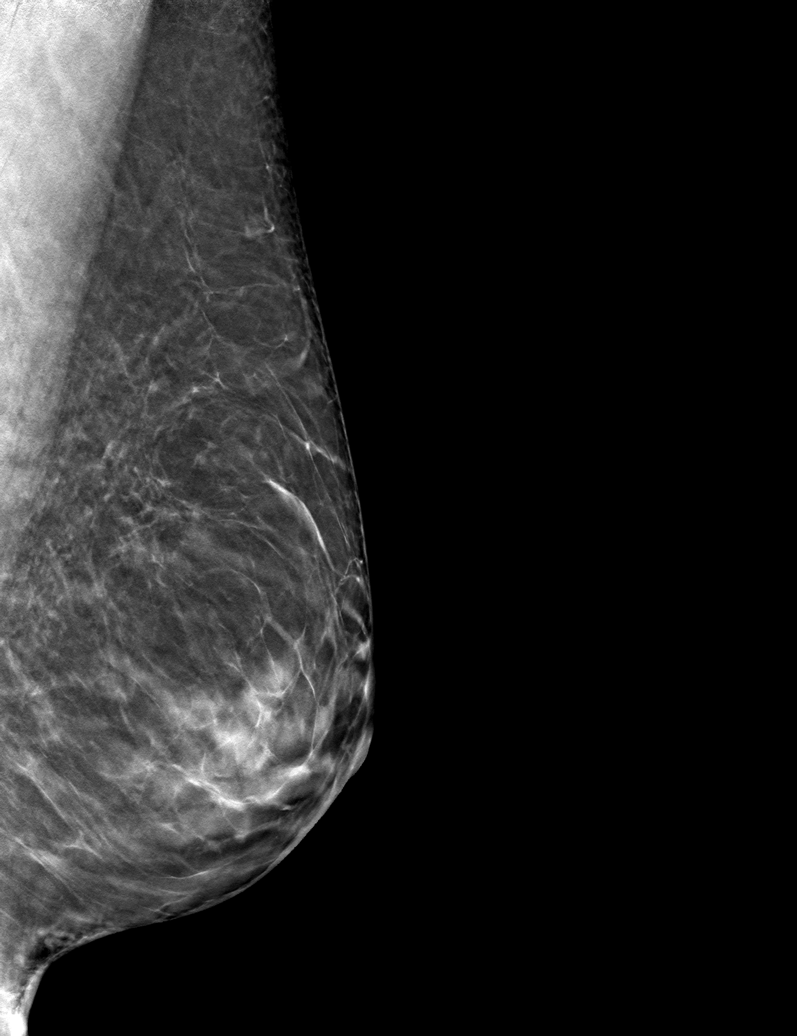

[R CC tomo · tomo slice 35/69.0]
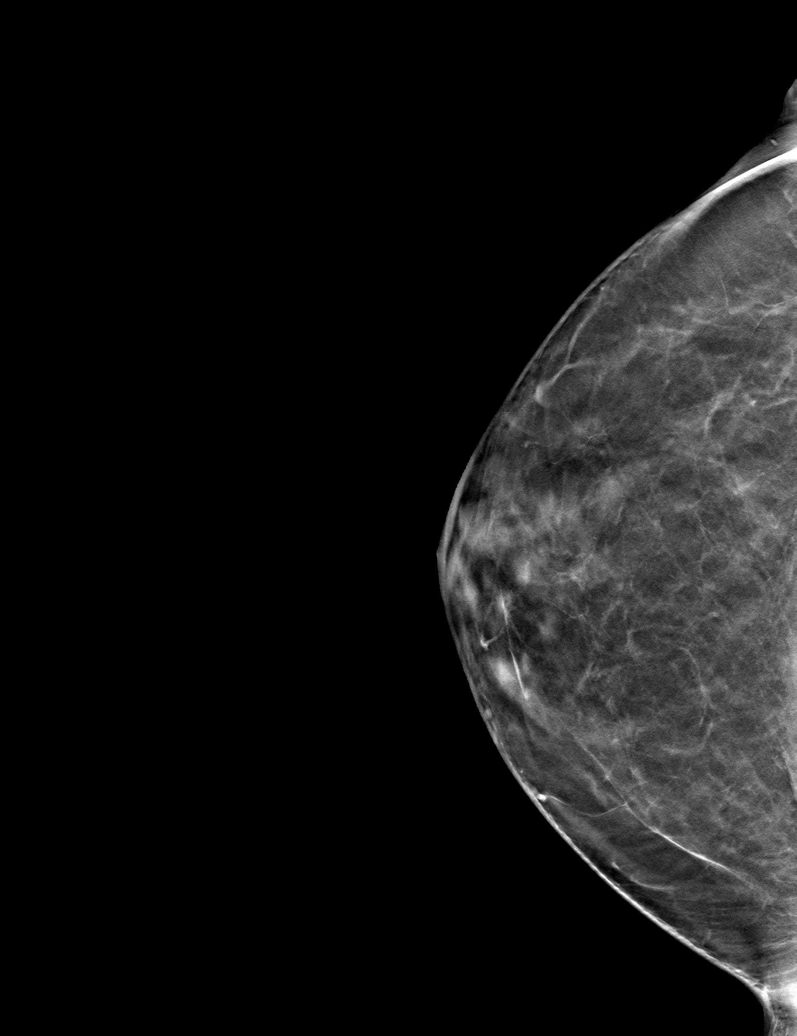

[6 of 17 positions shown; findings below may reference images not displayed]

ACR Breast Density Category c: The breast tissue is heterogeneously
dense, which may obscure small masses
FINDINGS: There are no findings suspicious for malignancy. Images were
processed with CAD.
IMPRESSION: No mammographic evidence of malignancy. A result letter of this
screening mammogram will be mailed directly to the patient.

RECOMMENDATION:
Screening mammogram in one year. (Code:EM-[id number])

BI-RADS CATEGORY  1: Negative.

## 2018-12-01 DIAGNOSIS — H11442 Conjunctival cysts, left eye: Secondary | ICD-10-CM | POA: Diagnosis not present

## 2019-01-02 ENCOUNTER — Encounter: Payer: Self-pay | Admitting: Family Medicine

## 2019-01-04 ENCOUNTER — Other Ambulatory Visit: Payer: Self-pay

## 2019-01-04 DIAGNOSIS — F411 Generalized anxiety disorder: Secondary | ICD-10-CM

## 2019-01-04 MED ORDER — ESCITALOPRAM OXALATE 10 MG PO TABS: 10.0000 mg | ORAL_TABLET | Freq: Every day | ORAL | 0 refills | Status: DC

## 2019-01-16 ENCOUNTER — Encounter: Payer: Self-pay | Admitting: Family Medicine

## 2019-01-16 ENCOUNTER — Ambulatory Visit (INDEPENDENT_AMBULATORY_CARE_PROVIDER_SITE_OTHER): Payer: BLUE CROSS/BLUE SHIELD | Admitting: Family Medicine

## 2019-01-16 VITALS — BP 120/78 | Wt 157.0 lb

## 2019-01-16 DIAGNOSIS — F411 Generalized anxiety disorder: Secondary | ICD-10-CM | POA: Diagnosis not present

## 2019-01-16 MED ORDER — ESCITALOPRAM OXALATE 10 MG PO TABS: 10.0000 mg | ORAL_TABLET | Freq: Every day | ORAL | 0 refills | Status: DC

## 2019-01-16 NOTE — Assessment & Plan Note (Signed)
>  15 minutes spent in face to face time with patient, >50% spent in counselling or coordination of care.  She is feeling much better with low dose lexapro.  eRx refills sent.  Declining psychotherapy at this time. Follow up in 3 weeks remotely. The patient indicates understanding of these issues and agrees with the plan.

## 2019-01-16 NOTE — Progress Notes (Signed)
Virtual Visit via Video   I connected with Samantha Stark on 01/16/19 at 11:00 AM EDT by a video enabled telemedicine application and verified that I am speaking with the correct person using two identifiers. Location patient: Home Location provider: [facility], Office Persons participating in the virtual visit: Elray Mcgregor, MD   I discussed the limitations of evaluation and management by telemedicine and the availability of in person appointments. The patient expressed understanding and agreed to proceed.  Subjective:   HPI:  Follow up depression.  Restarted lexapro 10 mg daily on 01/04/19.  She feels she is feeling much better. No longer having crying spells.  She was lashing out with her husband and that has subsided.  Walking 4 days per week.  Depression screen [facility] 2/9 01/16/2019 09/23/2017 05/12/2013  Decreased Interest 0 0 0  Down, Depressed, Hopeless 1 0 0  PHQ - 2 Score 1 0 0  Altered sleeping 1 - -  Tired, decreased energy 0 - -  Change in appetite 0 - -  Feeling bad or failure about yourself  0 - -  Trouble concentrating 0 - -  Moving slowly or fidgety/restless 0 - -  Suicidal thoughts 0 - -  PHQ-9 Score 2 - -  Difficult doing work/chores Somewhat difficult - -   GAD 7 : Generalized Anxiety Score 01/16/2019  Nervous, Anxious, on Edge 1  Control/stop worrying 0  Worry too much - different things 0  Trouble relaxing 1  Restless 0  Easily annoyed or irritable 0  Afraid - awful might happen 0  Total GAD 7 Score 2  Anxiety Difficulty Somewhat difficult     ROS: See pertinent positives and negatives per HPI.  Review of Systems  Constitutional: Negative.   HENT: Negative.   Respiratory: Negative.   Cardiovascular: Negative.   Gastrointestinal: Negative.   Musculoskeletal: Negative.   Skin: Negative.   Psychiatric/Behavioral: Positive for dysphoric mood. Negative for agitation, behavioral problems, confusion, decreased concentration,  hallucinations, self-injury, sleep disturbance and suicidal ideas. The patient is nervous/anxious. The patient is not hyperactive.   All other systems reviewed and are negative.   Patient Active Problem List   Diagnosis Date Noted  . Upper respiratory tract infection 09/20/2018  . Postmenopausal 08/28/2016  . Screening for multiple conditions 08/28/2016  . Actinic keratoses- scalp 08/24/2016  . Seasonal allergies 06/27/2016  . Encounter for counseling for care management of patient with chronic conditions and complex health needs using nurse-based model 06/27/2016  . Counseling on health promotion and disease prevention 06/27/2016  . Adjustment disorder  06/05/2016  . Peripheral edema 06/05/2016  . Overweight (BMI 25.0-29.9) 06/05/2016  . Non-toxic multinodular goiter 07/05/2013  . Pleural effusion 06/10/2013  . Benign heart Palpitations 06/29/2012  . Osteopenia 03/15/2012  . 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 Vitamin D deficiency 05/06/2010  . h/o HYPERCALCEMIA 05/06/2010  . Diverticulosis of large intestine 07/18/2008  . Generalized anxiety disorder 07/18/2008  . Essential hypertension 01/16/2008  . HYPERLIPIDEMIA- mgt per Cards 10/04/2007    Social History   Tobacco Use  . Smoking status: Never Smoker  . Smokeless tobacco: Never Used  Substance Use Topics  . Alcohol use: Yes    Alcohol/week: 3.0 standard drinks    Types: 3 Glasses of wine per week    Current Outpatient Medications:  .  cyclobenzaprine (FLEXERIL) 5 MG tablet, Take 5-10 mg by mouth 3 (three) times daily as needed for muscle spasms. Reported on 03/04/2016,  Disp: , Rfl:  .  EPINEPHrine 0.3 mg/0.3 mL IJ SOAJ injection, Inject 0.3 mg into the muscle as directed. Reported on 03/04/2016, Disp: , Rfl:  .  escitalopram (LEXAPRO) 10 MG tablet, Take 1 tablet (10 mg total) by mouth daily., Disp: 90 tablet, Rfl: 0 .  lisinopril (PRINIVIL,ZESTRIL) 20 MG tablet, Take 1 tablet (20 mg total) by mouth  daily., Disp: 90 tablet, Rfl: 3 .  metoprolol succinate (TOPROL-XL) 25 MG 24 hr tablet, Take 25 mg by mouth as needed (palpitations)., Disp: , Rfl:  .  propranolol (INDERAL) 10 MG tablet, Take one tablet by mouth as needed for palpitations up to 3 per day and at least 6 hours apart., Disp: 90 tablet, Rfl: 1 .  spironolactone (ALDACTONE) 25 MG tablet, Take 1 tablet (25 mg total) by mouth daily as needed (fluid)., Disp: 30 tablet, Rfl: 2 .  aspirin EC 81 MG tablet, Take 1 tablet (81 mg total) by mouth daily. (Patient not taking: Reported on 01/16/2019), Disp: 90 tablet, Rfl: 3  Allergies  Allergen Reactions  . Iodine Anaphylaxis  . Shrimp [Shellfish Allergy]     Angioedema caused by shrimp as well as oysters. EpiPen current  . Amlodipine Besylate     REACTION: edema, fatigue  . Calcium Channel Blockers     REACTION: edema , fatigue  . Cephalexin     REACTION: diarrhea  . Codeine     REACTION: nausea  . Coricidin D Cold-Flu-Sinus [Chlorphen-Pe-Acetaminophen] Hives  . Crestor [Rosuvastatin]     Per patient gave her leg cramps   . Erythromycin Diarrhea  . Hydrochlorothiazide W-Triamterene     REACTION: low potassium  . Morphine Nausea And Vomiting  . Phenylephrine Hcl Hives  . Statins     Leg cramps with Lipitor & Pravachol    Objective:  BP 120/78   Wt 157 lb (71.2 kg)   BMI 29.18 kg/m   VITALS: Per patient if applicable, see vitals. GENERAL: Alert, appears well and in no acute distress. HEENT: Atraumatic, conjunctiva clear, no obvious abnormalities on inspection of external nose and ears. NECK: Normal movements of the head and neck. CARDIOPULMONARY: No increased WOB. Speaking in clear sentences. I:E ratio WNL.  MS: Moves all visible extremities without noticeable abnormality. PSYCH: Pleasant and cooperative, well-groomed. Speech normal rate and rhythm. Affect is appropriate. Insight and judgement are appropriate. Attention is focused, linear, and appropriate.  NEURO: CN grossly  intact. Oriented as arrived to appointment on time with no prompting. Moves both UE equally.  SKIN: No obvious lesions, wounds, erythema, or cyanosis noted on face or hands.  Assessment and Plan:   Diagnoses and all orders for this visit:  Generalized anxiety disorder    . Reviewed expectations re: course of current medical issues. . Discussed self-management of symptoms. . Outlined signs and symptoms indicating need for more acute intervention. . Patient verbalized understanding and all questions were answered. Marland Kitchen Health Maintenance issues including appropriate healthy diet, exercise, and smoking avoidance were discussed with patient. . See orders for this visit as documented in the electronic medical record.  Arnette Norris, MD 01/16/2019

## 2019-01-26 ENCOUNTER — Telehealth: Payer: Self-pay | Admitting: Pharmacist

## 2019-01-26 DIAGNOSIS — E782 Mixed hyperlipidemia: Secondary | ICD-10-CM

## 2019-01-26 NOTE — Telephone Encounter (Signed)
Called pt to discuss starting Nexletol for her cholesterol. She would like to see how her cholesterol looks on red yeast rice prior to starting any new therapy. Scheduled lipids in mid June and can reassess need for Nexletol therapy at that time.

## 2019-02-06 ENCOUNTER — Encounter

## 2019-02-09 ENCOUNTER — Encounter: Payer: Self-pay | Admitting: Family Medicine

## 2019-03-27 ENCOUNTER — Other Ambulatory Visit: Payer: Self-pay | Admitting: Family Medicine

## 2019-03-27 DIAGNOSIS — F411 Generalized anxiety disorder: Secondary | ICD-10-CM

## 2019-03-27 MED ORDER — ESCITALOPRAM OXALATE 10 MG PO TABS: 10.0000 mg | ORAL_TABLET | Freq: Every day | ORAL | 1 refills | Status: DC

## 2019-03-31 ENCOUNTER — Other Ambulatory Visit: Payer: BC Managed Care – PPO

## 2019-04-12 ENCOUNTER — Telehealth: Payer: Self-pay | Admitting: Pharmacist

## 2019-04-12 ENCOUNTER — Other Ambulatory Visit: Payer: Self-pay | Admitting: Family Medicine

## 2019-04-12 ENCOUNTER — Other Ambulatory Visit: Payer: Self-pay

## 2019-04-12 DIAGNOSIS — E782 Mixed hyperlipidemia: Secondary | ICD-10-CM

## 2019-04-12 DIAGNOSIS — I10 Essential (primary) hypertension: Secondary | ICD-10-CM

## 2019-04-12 LAB — HEPATIC FUNCTION PANEL
ALT: 23 IU/L (ref 0–32)
AST: 26 IU/L (ref 0–40)
Albumin: 4.2 g/dL (ref 3.8–4.8)
Alkaline Phosphatase: 56 IU/L (ref 39–117)
Bilirubin Total: 0.3 mg/dL (ref 0.0–1.2)
Bilirubin, Direct: 0.09 mg/dL (ref 0.00–0.40)
Total Protein: 6.3 g/dL (ref 6.0–8.5)

## 2019-04-12 LAB — LIPID PANEL
Chol/HDL Ratio: 4.8 ratio — ABNORMAL HIGH (ref 0.0–4.4)
Cholesterol, Total: 300 mg/dL — ABNORMAL HIGH (ref 100–199)
HDL: 63 mg/dL (ref >39)
LDL Calculated: 221 mg/dL — ABNORMAL HIGH (ref 0–99)
Triglycerides: 82 mg/dL (ref 0–149)
VLDL Cholesterol Cal: 16 mg/dL (ref 5–40)

## 2019-04-12 NOTE — Telephone Encounter (Signed)
Called pt and left message to discuss lipid results - LDL very elevated at 221.  Will advise pt to stop taking red yeast rice as this is ineffective. She is previously intolerant to Crestor 5mg  once weekly, Lipitor, Vytorin, pravastatin, and Zetia (myalgias with all), as well as niacin (elevated LFTs).  Previously tried to get PCKS9i covered however pt has [facility] and they would not cover either med for primary prevention. Can try submitting again or discuss Nexletol - this would lower LDL ~20% and unsure if her plan will cover this either, however may be the best we can do.

## 2019-04-17 ENCOUNTER — Other Ambulatory Visit: Payer: Self-pay | Admitting: Family Medicine

## 2019-04-17 ENCOUNTER — Encounter: Payer: Self-pay | Admitting: Family Medicine

## 2019-04-17 MED ORDER — CLOBETASOL PROPIONATE 0.05 % EX CREA: 1.0000 "application " | TOPICAL_CREAM | Freq: Two times a day (BID) | CUTANEOUS | 0 refills | Status: DC

## 2019-04-18 NOTE — Telephone Encounter (Signed)
Left message again.

## 2019-04-20 NOTE — Telephone Encounter (Signed)
Spoke with patient who states she has a new Higher education careers adviser. Will try submitting for [employer] coverage.  ID: [id number] GRP: [id number] BIN: [id number]  Pharmacy customer service: [phone] Patient benefits: [id number]   Unable to submit online, informed that [employer] does not manage pharmacy claims. Called patient's insurance to submit prior authorization over the phone. Was advised she has a "non participating plan" and was transferred to member services [phone]. They also stated they are [employer] and cannot see any patient-specific information or submit prior authorization.

## 2019-04-21 MED ORDER — REPATHA SURECLICK 140 MG/ML ~~LOC~~ SOAJ: 1.0000 "pen " | SUBCUTANEOUS | 11 refills | Status: DC

## 2019-04-21 NOTE — Telephone Encounter (Signed)
Called back to [facility] since they did not send the correct form, they sent a general preauthorization form that is not for medications. Spoke with someone else who states the patient's PBM is [employer], [phone] option 1. Called them and they stated we need to send an rx to the pharmacy and then can call them back in an hour once they have triaged the rejected claim.

## 2019-04-21 NOTE — Telephone Encounter (Signed)
Insurance now covering [place], copay [id number]. Activated copay card and provided info to pharmacy - copay now $5. Left message for pt to advise her of approval.

## 2019-04-21 NOTE — Addendum Note (Signed)
Addended by: Lyfe Monger E on: 04/21/2019 11:38 AM   Modules accepted: Orders

## 2019-04-21 NOTE — Telephone Encounter (Signed)
Called member services and was transferred to the eligibility department. They will fax over a prior authorization form to be filled out.

## 2019-04-25 ENCOUNTER — Ambulatory Visit (INDEPENDENT_AMBULATORY_CARE_PROVIDER_SITE_OTHER): Payer: No Typology Code available for payment source | Admitting: Family Medicine

## 2019-04-25 ENCOUNTER — Encounter: Payer: Self-pay | Admitting: Family Medicine

## 2019-04-25 DIAGNOSIS — W57XXXA Bitten or stung by nonvenomous insect and other nonvenomous arthropods, initial encounter: Secondary | ICD-10-CM

## 2019-04-25 DIAGNOSIS — W57XXXD Bitten or stung by nonvenomous insect and other nonvenomous arthropods, subsequent encounter: Secondary | ICD-10-CM

## 2019-04-25 DIAGNOSIS — I10 Essential (primary) hypertension: Secondary | ICD-10-CM | POA: Diagnosis not present

## 2019-04-25 DIAGNOSIS — B880 Other acariasis: Secondary | ICD-10-CM

## 2019-04-25 HISTORY — DX: Bitten or stung by nonvenomous insect and other nonvenomous arthropods, initial encounter: W57.XXXA

## 2019-04-25 MED ORDER — PREDNISONE 20 MG PO TABS: ORAL_TABLET | ORAL | 0 refills | Status: AC

## 2019-04-25 MED ORDER — EPINEPHRINE 0.3 MG/0.3ML IJ SOAJ: 0.3000 mg | INTRAMUSCULAR | 11 refills | Status: AC

## 2019-04-25 NOTE — Assessment & Plan Note (Addendum)
Deteriorated.  Appear inflamed but not infected at this time based on images I could see on video chat. We discussed treatment and prevention ( ie long clothing, DEET) of chiggers. Given severity of her symptoms and how inflamed her lesions appeared, we agreed on an oral prednisone taper.  Meds ordered this encounter  Medications  . EPINEPHrine 0.3 mg/0.3 mL IJ SOAJ injection    Sig: Inject 0.3 mLs (0.3 mg total) into the muscle as directed.    Dispense:  1 each    Refill:  11  . predniSONE (DELTASONE) 20 MG tablet    Sig: Take 3 tablets (60 mg total) by mouth daily with breakfast for 4 days, THEN 2 tablets (40 mg total) daily with breakfast for 4 days, THEN 1 tablet (20 mg total) daily with breakfast for 4 days, THEN 0.5 tablets (10 mg total) daily with breakfast for 4 days.    Dispense:  26 tablet    Refill:  0   She will update me before the end of the week ,sooner if symptoms worsen or she develops fever or other signs of infection.  The patient indicates understanding of these issues and agrees with the plan.

## 2019-04-25 NOTE — Progress Notes (Signed)
Virtual Visit via Video   Due to the COVID-19 pandemic, this visit was completed with telemedicine (audio/video) technology to reduce patient and provider exposure as well as to preserve personal protective equipment.   I connected with Samantha Stark by a video enabled telemedicine application and verified that I am speaking with the correct person using two identifiers. Location patient: Home Location provider: [facility], Office Persons participating in the virtual visit: Elray Mcgregor, MD   I discussed the limitations of evaluation and management by telemedicine and the availability of in person appointments. The patient expressed understanding and agreed to proceed.  Care Team   Patient Care Team: Samantha Passy, MD as PCP - General (Family Medicine) Josue Hector, MD as Consulting Physician (Cardiology) Jacelyn Pi, MD as Consulting Physician (Endocrinology) Einar Grad, DC as Referring Physician (Chiropractic Medicine)  Subjective:   HPI:   Follow up- On 04/17/19 she sent me a message stating the following;  "I have upwards of 50 chigger bites that have become more inflamed daily since last Monday. Is there something you can call in to use topically? I have found nothing over the counter that helps."  She also attached pictures at that time which did look a lot like chigger bites.  They had been out in the woods and husband had similar bites as did others on the trip to the woods.  They treated and wash all bedding and clothes.  I offered to send in higher potency steroid cream- eRx sent for clobetasol.  ON July 10th, she sent the following message: "Dr. Deborra Medina, I was better for 1-1/2 days and then started getting white spots all around the application area. The drug reaction info indicated I should stop the med if that happened. The bites have now (after 1-1/2 days of not using the cream) re-flared in several areas that had been better. "  I  told her that she was right to stop taking it and we added it to her allergy list although I doubt it is a true allergy.  She has taken oral steroids with no issues and used less potent topical steroids as well.  The clobetasol was helping so as soon as she stopped taking it, the bites returned, larger, "angrier" and very itchy.  She does get temporary relief with Benadryl at night for the itching.  No fevers.  No open areas on the bites.   Current Outpatient Medications on File Prior to Visit  Medication Sig Dispense Refill   cyclobenzaprine (FLEXERIL) 5 MG tablet Take 5-10 mg by mouth 3 (three) times daily as needed for muscle spasms. Reported on 03/04/2016     escitalopram (LEXAPRO) 10 MG tablet Take 1 tablet (10 mg total) by mouth daily. 90 tablet 1   lisinopril (ZESTRIL) 20 MG tablet TAKE 1 TABLET DAILY 90 tablet 0   metoprolol succinate (TOPROL-XL) 25 MG 24 hr tablet Take 25 mg by mouth as needed (palpitations).     propranolol (INDERAL) 10 MG tablet Take one tablet by mouth as needed for palpitations up to 3 per day and at least 6 hours apart. 90 tablet 1   spironolactone (ALDACTONE) 25 MG tablet Take 1 tablet (25 mg total) by mouth daily as needed (fluid). 30 tablet 2   aspirin EC 81 MG tablet Take 1 tablet (81 mg total) by mouth daily. (Patient not taking: Reported on 01/16/2019) 90 tablet 3   Evolocumab (REPATHA SURECLICK) 765 MG/ML SOAJ Inject 1 pen  into the skin every 14 (fourteen) days. (Patient not taking: Reported on 04/25/2019) 2 pen 11   No current facility-administered medications on file prior to visit.     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 EpiPen current   Amlodipine Besylate     REACTION: edema, fatigue   Calcium Channel Blockers     REACTION: edema , fatigue   Cephalexin     REACTION: diarrhea   Clobetasol Other (See Comments)    Takes pigment out of skin    Codeine     REACTION: nausea   Coricidin D Cold-Flu-Sinus [Chlorphen-Pe-Acetaminophen] Hives   Crestor [Rosuvastatin]     Per patient gave her leg cramps    Erythromycin Diarrhea   Hydrochlorothiazide W-Triamterene     REACTION: low potassium   Morphine Nausea And Vomiting   Phenylephrine Hcl Hives   Statins     Leg cramps with Lipitor & Pravachol    Past Medical History:  Diagnosis Date   Allergy    Anxiety    Situational   Cancer ([place])    scalp   Chest pain 2006 & 2008   Negative nuclear stress study, Dr. [name]   Diverticulosis    Hyperlipidemia    Hypertension    MVP (mitral valve prolapse)    Seasonal allergies     Past Surgical History:  Procedure Laterality Date   ABDOMINAL HYSTERECTOMY     COLONOSCOPY  2008 & 2011   [facility], negative except for Tics   KNEE ARTHROSCOPY Bilateral 2002, 2007   PARTIAL HYSTERECTOMY  1993   ROTATOR CUFF REPAIR Right 2010   SPINE SURGERY  1992   Lumbar laminectomy L5-S1   TONSILLECTOMY  1976   TUBAL LIGATION      Family History  Problem Relation Age of Onset   Heart attack Maternal Uncle        MI in 90s   Colon cancer Maternal Uncle 60   Osteoporosis Maternal Grandmother    Diabetes Maternal Grandmother    Colon polyps Mother    Hyperlipidemia Mother    Goiter Mother        also Guillain Barre   Asthma Mother    Colon polyps Brother    Hyperlipidemia Brother        X2   Colon polyps Maternal Aunt    Diabetes Maternal Aunt        X2   Alcohol abuse Father    Hyperlipidemia Sister    Stroke Maternal Grandfather        in 75s   Alzheimer's disease Maternal Aunt    Alcohol abuse Paternal Aunt    Hyperlipidemia Paternal Aunt    Alcohol abuse Paternal Uncle    Diabetes Paternal Grandmother    Hyperlipidemia Brother     Social History   Socioeconomic History   Marital status: Married    Spouse name: Not on file   Number of children: Not on file   Years of  education: Not on file   Highest education level: Not on file  Occupational History   Occupation: Surveyor, mining: [employer]  Social Needs   Financial resource strain: Not on file   Food insecurity    Worry: Not on file    Inability: Not on file   Transportation needs    Medical: Not on file    Non-medical: Not on  file  Tobacco Use   Smoking status: Never Smoker   Smokeless tobacco: Never Used  Substance and Sexual Activity   Alcohol use: Yes    Alcohol/week: 3.0 standard drinks    Types: 3 Glasses of wine per week   Drug use: No   Sexual activity: Yes    Partners: Male    Birth control/protection: Surgical  Lifestyle   Physical activity    Days per week: Not on file    Minutes per session: Not on file   Stress: Not on file  Relationships   Social connections    Talks on phone: Not on file    Gets together: Not on file    Attends religious service: Not on file    Active member of club or organization: Not on file    Attends meetings of clubs or organizations: Not on file    Relationship status: Not on file   Intimate partner violence    Fear of current or ex partner: Not on file    Emotionally abused: Not on file    Physically abused: Not on file    Forced sexual activity: Not on file  Other Topics Concern   Not on file  Social History Narrative   Married.   2 children, 6 grandchildren.   Regular exercise: yes   Caffeine use: daily   Enjoys reading, gardening, sports, teaching yoga.   The PMH, PSH, Social History, Family History, Medications, and allergies have been reviewed in [facility], and have been updated if relevant.     Review of Systems  Constitutional: Negative.   Respiratory: Negative.   Cardiovascular: Negative.   Gastrointestinal: Negative.   Genitourinary: Negative.   Skin: Positive for itching and rash.  Neurological: Negative.   Endo/Heme/Allergies: Negative.   All other systems reviewed and are negative.     Patient Active Problem List   Diagnosis Date Noted   Upper respiratory tract infection 09/20/2018   Postmenopausal 08/28/2016   Screening for multiple conditions 08/28/2016   Actinic keratoses- scalp 08/24/2016   Seasonal allergies 06/27/2016   Encounter for counseling for care management of patient with chronic conditions and complex health needs using nurse-based model 06/27/2016   Counseling on health promotion and disease prevention 06/27/2016   Adjustment disorder  06/05/2016   Peripheral edema 06/05/2016   Overweight (BMI 25.0-29.9) 06/05/2016   Non-toxic multinodular goiter 07/05/2013   Pleural effusion 06/10/2013   Benign heart Palpitations 06/29/2012   Osteopenia 03/15/2012   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 01/26/2012   Vitamin D deficiency 05/06/2010   h/o HYPERCALCEMIA 05/06/2010   Diverticulosis of large intestine 07/18/2008   Generalized anxiety disorder 07/18/2008   Essential hypertension 01/16/2008   HYPERLIPIDEMIA- mgt per Cards 10/04/2007    Social History   Tobacco Use   Smoking status: Never Smoker   Smokeless tobacco: Never Used  Substance Use Topics   Alcohol use: Yes    Alcohol/week: 3.0 standard drinks    Types: 3 Glasses of wine per week    Current Outpatient Medications:    cyclobenzaprine (FLEXERIL) 5 MG tablet, Take 5-10 mg by mouth 3 (three) times daily as needed for muscle spasms. Reported on 03/04/2016, Disp: , Rfl:    EPINEPHrine 0.3 mg/0.3 mL IJ SOAJ injection, Inject 0.3 mLs (0.3 mg total) into the muscle as directed., Disp: 1 each, Rfl: 11   escitalopram (LEXAPRO) 10 MG tablet, Take 1 tablet (10 mg total) by mouth daily., Disp: 90 tablet, Rfl: 1   lisinopril (ZESTRIL)  20 MG tablet, TAKE 1 TABLET DAILY, Disp: 90 tablet, Rfl: 0   metoprolol succinate (TOPROL-XL) 25 MG 24 hr tablet, Take 25 mg by mouth as needed (palpitations)., Disp: , Rfl:    propranolol (INDERAL) 10 MG tablet, Take one tablet by mouth  as needed for palpitations up to 3 per day and at least 6 hours apart., Disp: 90 tablet, Rfl: 1   spironolactone (ALDACTONE) 25 MG tablet, Take 1 tablet (25 mg total) by mouth daily as needed (fluid)., Disp: 30 tablet, Rfl: 2   aspirin EC 81 MG tablet, Take 1 tablet (81 mg total) by mouth daily. (Patient not taking: Reported on 01/16/2019), Disp: 90 tablet, Rfl: 3   Evolocumab (REPATHA SURECLICK) 409 MG/ML SOAJ, Inject 1 pen into the skin every 14 (fourteen) days. (Patient not taking: Reported on 04/25/2019), Disp: 2 pen, Rfl: 11  Allergies  Allergen Reactions   Iodine Anaphylaxis   Shrimp [Shellfish Allergy]     Angioedema caused by shrimp as well as oysters. EpiPen current   Amlodipine Besylate     REACTION: edema, fatigue   Calcium Channel Blockers     REACTION: edema , fatigue   Cephalexin     REACTION: diarrhea   Clobetasol Other (See Comments)    Takes pigment out of skin   Codeine     REACTION: nausea   Coricidin D Cold-Flu-Sinus [Chlorphen-Pe-Acetaminophen] Hives   Crestor [Rosuvastatin]     Per patient gave her leg cramps    Erythromycin Diarrhea   Hydrochlorothiazide W-Triamterene     REACTION: low potassium   Morphine Nausea And Vomiting   Phenylephrine Hcl Hives   Statins     Leg cramps with Lipitor & Pravachol    Objective:  BP 120/78    Pulse 67   VITALS: Per patient if applicable, see vitals. GENERAL: Alert, appears well and in no acute distress. HEENT: Atraumatic, conjunctiva clear, no obvious abnormalities on inspection of external nose and ears. NECK: Normal movements of the head and neck. CARDIOPULMONARY: No increased WOB. Speaking in clear sentences. I:E ratio WNL.  MS: Moves all visible extremities without noticeable abnormality. PSYCH: Pleasant and cooperative, well-groomed. Speech normal rate and rhythm. Affect is appropriate. Insight and judgement are appropriate. Attention is focused, linear, and appropriate.  NEURO: CN grossly intact.  Oriented as arrived to appointment on time with no prompting. Moves both UE equally.  SKIN: nickle sized inflamed lesions on her side and legs- much larger and more inflamed than the last time I saw her.  Depression screen [facility] 2/9 01/16/2019 09/23/2017 05/12/2013  Decreased Interest 0 0 0  Down, Depressed, Hopeless 1 0 0  PHQ - 2 Score 1 0 0  Altered sleeping 1 - -  Tired, decreased energy 0 - -  Change in appetite 0 - -  Feeling bad or failure about yourself  0 - -  Trouble concentrating 0 - -  Moving slowly or fidgety/restless 0 - -  Suicidal thoughts 0 - -  PHQ-9 Score 2 - -  Difficult doing work/chores Somewhat difficult - -    Assessment and Plan:   Reid was seen today for follow-up.  Diagnoses and all orders for this visit:  Essential hypertension -     EPINEPHrine 0.3 mg/0.3 mL IJ SOAJ injection; Inject 0.3 mLs (0.3 mg total) into the muscle as directed.     COVID-19 Education: The signs and symptoms of COVID-19 were discussed with the patient and how to seek care for testing if needed. The  importance of social distancing was discussed today.  Reviewed expectations re: course of current medical issues.  Discussed self-management of symptoms.  Outlined signs and symptoms indicating need for more acute intervention.  Patient verbalized understanding and all questions were answered.  Health Maintenance issues including appropriate healthy diet, exercise, and smoking avoidance were discussed with patient.  See orders for this visit as documented in the electronic medical record.  Arnette Norris, MD  Records requested if needed. Time spent: 25 minutes, of which >50% was spent in obtaining information about her symptoms, reviewing her previous labs, evaluations, and treatments, counseling her about her condition (please see the discussed topics above), and developing a plan to further investigate it; she had a number of questions which I addressed.

## 2019-04-27 NOTE — Addendum Note (Signed)
Addended by: Verl Whitmore E on: 04/27/2019 08:31 AM   Modules accepted: Orders

## 2019-04-27 NOTE — Telephone Encounter (Signed)
Called pt, she states she is giving her first Repatha injection tomorrow. Scheduled follow up lab work in Sept.

## 2019-05-01 ENCOUNTER — Encounter: Payer: Self-pay | Admitting: Family Medicine

## 2019-05-02 ENCOUNTER — Encounter: Payer: Self-pay | Admitting: Family Medicine

## 2019-05-02 DIAGNOSIS — I10 Essential (primary) hypertension: Secondary | ICD-10-CM

## 2019-05-02 DIAGNOSIS — F411 Generalized anxiety disorder: Secondary | ICD-10-CM

## 2019-05-02 MED ORDER — LISINOPRIL 20 MG PO TABS: 20.0000 mg | ORAL_TABLET | Freq: Every day | ORAL | 2 refills | Status: DC

## 2019-05-02 MED ORDER — ESCITALOPRAM OXALATE 10 MG PO TABS: 10.0000 mg | ORAL_TABLET | Freq: Every day | ORAL | 2 refills | Status: DC

## 2019-06-01 ENCOUNTER — Other Ambulatory Visit: Payer: Self-pay

## 2019-06-01 ENCOUNTER — Encounter: Payer: Self-pay | Admitting: Family Medicine

## 2019-06-01 DIAGNOSIS — I10 Essential (primary) hypertension: Secondary | ICD-10-CM

## 2019-06-01 MED ORDER — CYCLOBENZAPRINE HCL 5 MG PO TABS: 5.0000 mg | ORAL_TABLET | Freq: Three times a day (TID) | ORAL | 1 refills | Status: DC | PRN

## 2019-06-16 ENCOUNTER — Other Ambulatory Visit: Payer: Self-pay

## 2019-06-20 ENCOUNTER — Telehealth: Payer: Self-pay | Admitting: Pharmacist

## 2019-06-20 NOTE — Telephone Encounter (Signed)
Called pt and left message regarding missed lipid lab appt from last week - need to reschedule to assess efficacy of Repatha.

## 2019-06-29 NOTE — Telephone Encounter (Signed)
Left message again to r/s labs.

## 2019-07-17 ENCOUNTER — Other Ambulatory Visit: Payer: No Typology Code available for payment source | Admitting: *Deleted

## 2019-07-17 ENCOUNTER — Other Ambulatory Visit: Payer: Self-pay

## 2019-07-17 DIAGNOSIS — E782 Mixed hyperlipidemia: Secondary | ICD-10-CM

## 2019-07-17 LAB — LIPID PANEL
Chol/HDL Ratio: 3 ratio (ref 0.0–4.4)
Cholesterol, Total: 202 mg/dL — ABNORMAL HIGH (ref 100–199)
HDL: 68 mg/dL (ref >39)
LDL Chol Calc (NIH): 125 mg/dL — ABNORMAL HIGH (ref 0–99)
Triglycerides: 46 mg/dL (ref 0–149)
VLDL Cholesterol Cal: 9 mg/dL (ref 5–40)

## 2019-07-17 LAB — HEPATIC FUNCTION PANEL
ALT: 20 IU/L (ref 0–32)
AST: 29 IU/L (ref 0–40)
Albumin: 4.1 g/dL (ref 3.8–4.8)
Alkaline Phosphatase: 62 IU/L (ref 39–117)
Bilirubin Total: 0.4 mg/dL (ref 0.0–1.2)
Bilirubin, Direct: 0.09 mg/dL (ref 0.00–0.40)
Total Protein: 6.5 g/dL (ref 6.0–8.5)

## 2019-07-18 ENCOUNTER — Telehealth: Payer: Self-pay | Admitting: Pharmacist

## 2019-07-18 ENCOUNTER — Other Ambulatory Visit: Payer: Self-pay

## 2019-07-18 DIAGNOSIS — E782 Mixed hyperlipidemia: Secondary | ICD-10-CM

## 2019-07-18 NOTE — Telephone Encounter (Signed)
LDL has improved from 221 to 125 since starting Repatha injections (44% reduction). Pt reports tolerating therapy well. Eating a low fat diet but states work has been busy and she hasn't exercised like she normally does. Discussed possibility of adding Nexletol to target LDL goal < 100, however they do not currently cover primary prevention. Pt does not want to add another medication at this time and prefers to work on lifestyle improvements. Will recheck lipids in another 3 months.

## 2019-08-23 ENCOUNTER — Other Ambulatory Visit: Payer: Self-pay | Admitting: Family Medicine

## 2019-08-23 DIAGNOSIS — Z780 Asymptomatic menopausal state: Secondary | ICD-10-CM

## 2019-09-11 ENCOUNTER — Ambulatory Visit
Admission: RE | Admit: 2019-09-11 | Discharge: 2019-09-11 | Disposition: A | Discharge status: Home / Self Care | Payer: No Typology Code available for payment source | Source: Ambulatory Visit | Attending: Family Medicine | Admitting: Family Medicine

## 2019-09-11 DIAGNOSIS — Z780 Asymptomatic menopausal state: Secondary | ICD-10-CM

## 2019-10-02 ENCOUNTER — Telehealth: Payer: Self-pay | Admitting: Cardiovascular Disease

## 2019-10-02 NOTE — Telephone Encounter (Signed)
Patient called stating the last 2 times she has given herself a Repatha injection (in her thigh) she has gotten a rash around injection site- it is itchy and raised,inflammed, brusied. She has beening using topical benadryl which has helped with the itchy. 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 No allergy to known to latex. Paitent has been on since at least July. Would be uncommon at this point to develop anaphlaxis but I can not completely rule it out. Suggested patient take a benadryl and possibly famotidine an hour prior to next injection to see if this helps. She is to stop the medication if the local injection site reaction starts to spread. Patient is agreeable to plan.

## 2019-10-02 NOTE — Telephone Encounter (Signed)
New Message  Pt c/o medication issue:  1. Name of Medication:   Evolocumab (REPATHA SURECLICK) [id number] MG/ML SOAJ    2. How are you currently taking this medication (dosage and times per day)? Inject 1 pen into the skin every 14 (fourteen) days.Patient not taking: Reported on 04/25/2019  3. Are you having a reaction (difficulty breathing--STAT)? Yes  4. What is your medication issue? Patient has had a reaction to the medication. Patient has a rash at injection site and would like to speak with Melissa about this. Please give a call back to discuss.

## 2019-11-03 ENCOUNTER — Other Ambulatory Visit: Payer: No Typology Code available for payment source

## 2020-01-20 ENCOUNTER — Ambulatory Visit: Payer: No Typology Code available for payment source | Attending: Internal Medicine

## 2020-01-20 VITALS — BP 140/79 | HR 69 | Resp 16

## 2020-01-20 DIAGNOSIS — Z23 Encounter for immunization: Secondary | ICD-10-CM

## 2020-01-20 NOTE — Progress Notes (Signed)
   Covid-19 Vaccination Clinic  Name:  Samantha Stark    MRN: [id number] DOB: May 11, 1956  01/20/2020  [name] was observed post Covid-19 immunization for 30 minutes based on pre-vaccination screening .  During the observation period, she experienced an adverse reaction with the following symptoms:  tingling in her throat.  Assessment : Time of assessment 1038. Alert and oriented and Unlabored breathing.  Actions taken:  Vitals sign taken  EMS on site and hand off completed to Paramedic (Name [place]). Patient was continually observed by this FNP-C.   Vitals:   01/20/20 1039 01/20/20 1047  BP: (!) 146/82 136/80  Pulse: 77   Resp: 16   SpO2: 95% 95%    Medications administered: Diphenhydramine (Benadryl) 25 mg capsule, by mouth. Time: 1038. Administered by Alfredo Batty, FNP-C.  Disposition: Reports no further symptoms of adverse reaction after observation for 45 minutes. Discharged home. Instructed to follow up with PCP and allergist for evaluation for second dose. Instructed to call 911 for trouble breathing, rapid heart rate, dizziness, swelling of tongue or throat.  The Patient was provided with Vaccine Information Sheet and instruction to access the V-Safe system.    Immunizations Administered    Name Date Dose VIS Date Route   Pfizer COVID-19 Vaccine 01/20/2020 10:22 AM 0.3 mL 09/22/2019 Intramuscular   Manufacturer: [place]   Lot: [id number]   [place]: [id number]

## 2020-01-23 ENCOUNTER — Encounter: Payer: Self-pay | Admitting: *Deleted

## 2020-01-23 ENCOUNTER — Telehealth: Payer: Self-pay | Admitting: *Deleted

## 2020-01-23 NOTE — Telephone Encounter (Signed)
Called and left a voicemail asking for the patient to return call to discuss symptoms after first COVID vaccine. MyChart message sent. Will follow up.

## 2020-01-26 NOTE — Telephone Encounter (Signed)
Letter has been mailed to the patient's home.

## 2020-02-14 ENCOUNTER — Ambulatory Visit: Payer: No Typology Code available for payment source | Attending: Internal Medicine

## 2020-02-14 DIAGNOSIS — Z23 Encounter for immunization: Secondary | ICD-10-CM

## 2020-02-14 NOTE — Progress Notes (Addendum)
   Covid-19 Vaccination Clinic  Name:  Samantha Stark    MRN: [id number] DOB: [birth year]  02/14/2020  [name] was observed post Covid-19 immunization for 30 minutes based on pre-vaccination screening without incident. She was provided with Vaccine Information Sheet and instruction to access the V-Safe system.   [name] was instructed to call 911 with any severe reactions post vaccine: Marland Kitchen Difficulty breathing  . Swelling of face and throat  . A fast heartbeat  . A bad rash all over body  . Dizziness and weakness   Immunizations Administered    Name Date Dose VIS Date Route   Pfizer COVID-19 Vaccine 02/14/2020  4:27 PM 0.3 mL 12/06/2018 Intramuscular   Manufacturer: [place]   Lot: [id number]   [place]: [id number]

## 2020-03-22 DIAGNOSIS — Z20822 Contact with and (suspected) exposure to covid-19: Secondary | ICD-10-CM | POA: Diagnosis not present

## 2020-11-06 ENCOUNTER — Other Ambulatory Visit: Payer: Self-pay

## 2020-11-06 ENCOUNTER — Ambulatory Visit: Payer: No Typology Code available for payment source | Admitting: Nurse Practitioner

## 2020-11-07 ENCOUNTER — Encounter: Payer: Self-pay | Admitting: Nurse Practitioner

## 2020-11-07 ENCOUNTER — Ambulatory Visit (INDEPENDENT_AMBULATORY_CARE_PROVIDER_SITE_OTHER): Payer: BC Managed Care – PPO | Admitting: Nurse Practitioner

## 2020-11-07 VITALS — BP 120/78 | HR 70 | Temp 97.1°F | Ht 62.0 in | Wt 157.2 lb

## 2020-11-07 DIAGNOSIS — E782 Mixed hyperlipidemia: Secondary | ICD-10-CM

## 2020-11-07 DIAGNOSIS — F411 Generalized anxiety disorder: Secondary | ICD-10-CM

## 2020-11-07 DIAGNOSIS — Z1231 Encounter for screening mammogram for malignant neoplasm of breast: Secondary | ICD-10-CM

## 2020-11-07 DIAGNOSIS — R609 Edema, unspecified: Secondary | ICD-10-CM

## 2020-11-07 DIAGNOSIS — E559 Vitamin D deficiency, unspecified: Secondary | ICD-10-CM

## 2020-11-07 DIAGNOSIS — Z6828 Body mass index (BMI) 28.0-28.9, adult: Secondary | ICD-10-CM

## 2020-11-07 DIAGNOSIS — Z780 Asymptomatic menopausal state: Secondary | ICD-10-CM

## 2020-11-07 DIAGNOSIS — E042 Nontoxic multinodular goiter: Secondary | ICD-10-CM | POA: Diagnosis not present

## 2020-11-07 DIAGNOSIS — Z23 Encounter for immunization: Secondary | ICD-10-CM | POA: Diagnosis not present

## 2020-11-07 DIAGNOSIS — E663 Overweight: Secondary | ICD-10-CM

## 2020-11-07 DIAGNOSIS — I10 Essential (primary) hypertension: Secondary | ICD-10-CM | POA: Diagnosis not present

## 2020-11-07 MED ORDER — LISINOPRIL 20 MG PO TABS: 20.0000 mg | ORAL_TABLET | Freq: Every day | ORAL | 1 refills | Status: AC

## 2020-11-07 MED ORDER — ESCITALOPRAM OXALATE 10 MG PO TABS: 10.0000 mg | ORAL_TABLET | Freq: Every day | ORAL | 1 refills | Status: AC

## 2020-11-07 NOTE — Assessment & Plan Note (Signed)
Stable mood with lexapro Continue medication

## 2020-11-07 NOTE — Progress Notes (Signed)
Subjective:  Patient ID: Samantha Stark, female    DOB: Mar 25, 1956  Age: 65 y.o. MRN: [id number]  CC: Establish Care (TOC-Dr. Aron/Pt would like to discuss HTN and weight. Pt states she does not check BP regularly but is aware of when it is high because she will get a headache. )  HPI  Essential hypertension BP at goal will lisinopril BP Readings from Last 3 Encounters:  11/07/20 120/78  01/20/20 140/79  04/25/19 120/78   Repeat BMP Maintain current medication  Peripheral edema Controlled with maintain low salt diet. No LE edema noted today  HYPERLIPIDEMIA- mgt per Cards Unable to tolerate statins Maintains a DASH diet rapatha was discontinued due to high cost without insurance  Repeat lipid panel  Overweight with body mass index (BMI) of 28 to 28.9 in adult Weight goal is 140Lbs. She was last at this weight over 54yrs ago. Current weight at 157lbs. Has been at this weight for over 3yrs. Reports she has made dietary changes and decreased calorie intake, but no significant weight loss noted. Reports current weight affects her mood and energy level Does not have any exercise regimen. Repeat BMP, TSH, lipid panel, and vitamin D today. entered referral to weight management clinic.  Wt Readings from Last 3 Encounters:  11/07/20 157 lb 3.2 oz (71.3 kg)  01/16/19 157 lb (71.2 kg)  10/10/18 161 lb 1.9 oz (73.1 kg)   Reviewed past Medical, Social and Family history today.  Outpatient Medications Prior to Visit  Medication Sig Dispense Refill  . aspirin EC 81 MG tablet Take 1 tablet (81 mg total) by mouth daily. 90 tablet 3  . cyclobenzaprine (FLEXERIL) 5 MG tablet Take 1-2 tablets (5-10 mg total) by mouth 3 (three) times daily as needed for muscle spasms. 30 tablet 1  . EPINEPHrine 0.3 mg/0.3 mL IJ SOAJ injection Inject 0.3 mLs (0.3 mg total) into the muscle as directed. 1 each 11  . metoprolol succinate (TOPROL-XL) 25 MG 24 hr tablet Take 25 mg by mouth as needed  (palpitations).    Marland Kitchen escitalopram (LEXAPRO) 10 MG tablet Take 1 tablet (10 mg total) by mouth daily. 90 tablet 2  . lisinopril (ZESTRIL) 20 MG tablet Take 1 tablet (20 mg total) by mouth daily. 90 tablet 2  . propranolol (INDERAL) 10 MG tablet Take one tablet by mouth as needed for palpitations up to 3 per day and at least 6 hours apart. 90 tablet 1  . spironolactone (ALDACTONE) 25 MG tablet Take 1 tablet (25 mg total) by mouth daily as needed (fluid). 30 tablet 2  . Evolocumab (REPATHA SURECLICK) 202 MG/ML SOAJ Inject 1 pen into the skin every 14 (fourteen) days. (Patient not taking: Reported on 11/07/2020) 2 pen 11   No facility-administered medications prior to visit.    ROS See HPI  Objective:  BP 120/78 (BP Location: Left Arm, Patient Position: Sitting, Cuff Size: Normal)   Pulse 70   Temp (!) 97.1 F (36.2 C) (Temporal)   Ht 5\' 2"  (1.575 m)   Wt 157 lb 3.2 oz (71.3 kg)   SpO2 98%   BMI 28.75 kg/m   Physical Exam Vitals reviewed.  Neck:     Thyroid: No thyroid mass, thyromegaly or thyroid tenderness.  Cardiovascular:     Rate and Rhythm: Normal rate and regular rhythm.     Pulses: Normal pulses.     Heart sounds: Normal heart sounds.  Pulmonary:     Effort: Pulmonary effort is normal.  Breath sounds: Normal breath sounds.  Musculoskeletal:     Cervical back: Normal range of motion and neck supple.     Right lower leg: No edema.     Left lower leg: No edema.  Lymphadenopathy:     Cervical: No cervical adenopathy.  Skin:    General: Skin is warm and dry.  Neurological:     Mental Status: She is alert and oriented to person, place, and time.  Psychiatric:        Mood and Affect: Mood normal.        Behavior: Behavior normal.        Thought Content: Thought content normal.    Assessment & Plan:  This visit occurred during the SARS-CoV-2 public health emergency.  Safety protocols were in place, including screening questions prior to the visit, additional usage of  staff PPE, and extensive cleaning of exam room while observing appropriate contact time as indicated for disinfecting solutions.   Samantha Stark was seen today for establish care.  Diagnoses and all orders for this visit:  Essential hypertension -     Basic metabolic panel; Future -     CBC; Future -     lisinopril (ZESTRIL) 20 MG tablet; Take 1 tablet (20 mg total) by mouth daily.  Influenza vaccine needed -     Flu Vaccine QUAD High Dose(Fluad)  Vitamin D deficiency -     Vitamin D 1,25 dihydroxy; Future  Peripheral edema -     Basic metabolic panel; Future  Non-toxic multinodular goiter -     TSH; Future  HYPERLIPIDEMIA- mgt per Cards -     Hepatic function panel; Future -     Lipid panel; Future  Overweight with body mass index (BMI) of 28 to 28.9 in adult -     Amb Ref to Medical Weight Management  Asymptomatic age-related postmenopausal state -     DG Bone Density; Future  Breast cancer screening by mammogram -     MM DIGITAL SCREENING BILATERAL; Future  Generalized anxiety disorder -     escitalopram (LEXAPRO) 10 MG tablet; Take 1 tablet (10 mg total) by mouth daily.   Problem List Items Addressed This Visit      Cardiovascular and Mediastinum   Essential hypertension - Primary (Chronic)    BP at goal will lisinopril BP Readings from Last 3 Encounters:  11/07/20 120/78  01/20/20 140/79  04/25/19 120/78   Repeat BMP Maintain current medication      Relevant Medications   lisinopril (ZESTRIL) 20 MG tablet   Other Relevant Orders   Basic metabolic panel   CBC     Endocrine   Non-toxic multinodular goiter   Relevant Orders   TSH     Other   Generalized anxiety disorder   Relevant Medications   escitalopram (LEXAPRO) 10 MG tablet   HYPERLIPIDEMIA- mgt per Cards (Chronic)    Unable to tolerate statins Maintains a DASH diet rapatha was discontinued due to high cost without insurance  Repeat lipid panel      Relevant Medications   lisinopril  (ZESTRIL) 20 MG tablet   Other Relevant Orders   Hepatic function panel   Lipid panel   Overweight with body mass index (BMI) of 28 to 28.9 in adult    Weight goal is 140Lbs. She was last at this weight over 3623yrs ago. Current weight at 157lbs. Has been at this weight for over 2037yrs. Reports she has made dietary changes and decreased calorie intake, but  no significant weight loss noted. Reports current weight affects her mood and energy level Does not have any exercise regimen. Repeat BMP, TSH, lipid panel, and vitamin D today. entered referral to weight management clinic.      Relevant Orders   Amb Ref to Medical Weight Management   Peripheral edema (Chronic)    Controlled with maintain low salt diet. No LE edema noted today      Relevant Orders   Basic metabolic panel   Vitamin D deficiency (Chronic)   Relevant Orders   Vitamin D 1,25 dihydroxy    Other Visit Diagnoses    Influenza vaccine needed       Relevant Orders   Flu Vaccine QUAD High Dose(Fluad) (Completed)   Asymptomatic age-related postmenopausal state       Relevant Orders   DG Bone Density   Breast cancer screening by mammogram       Relevant Orders   MM DIGITAL SCREENING BILATERAL      Follow-up: Return in about 6 months (around 05/07/2021) for HTN and , hyperlipidemia (fasting).  Wilfred Lacy, NP

## 2020-11-07 NOTE — Assessment & Plan Note (Signed)
Unable to tolerate statins Maintains a DASH diet rapatha was discontinued due to high cost without insurance  Repeat lipid panel

## 2020-11-07 NOTE — Assessment & Plan Note (Signed)
Weight goal is 140Lbs. She was last at this weight over 75yrs ago. Current weight at 157lbs. Has been at this weight for over 78yrs. Reports she has made dietary changes and decreased calorie intake, but no significant weight loss noted. Reports current weight affects her mood and energy level Does not have any exercise regimen. Repeat BMP, TSH, lipid panel, and vitamin D today. entered referral to weight management clinic.

## 2020-11-07 NOTE — Assessment & Plan Note (Signed)
Controlled with maintain low salt diet. No LE edema noted today

## 2020-11-07 NOTE — Patient Instructions (Signed)
Schedule lab appt for blood draw (need to be fasting at least 6-8hrs prior to blood draw)  You will be contacted to schedule appt for mammogram, bone density, and with weight management clinic.  You are due to colonoscopy 03/2021.  Maintain current medications.

## 2020-11-07 NOTE — Assessment & Plan Note (Signed)
BP at goal will lisinopril BP Readings from Last 3 Encounters:  11/07/20 120/78  01/20/20 140/79  04/25/19 120/78   Repeat BMP Maintain current medication

## 2020-11-21 ENCOUNTER — Telehealth: Payer: Self-pay | Admitting: Nurse Practitioner

## 2020-11-21 NOTE — Telephone Encounter (Signed)
lvm 11/21/20 AD, trying to get information to schedule bone density and mammogram or give pt number to call [facility] [phone] and schedule

## 2020-11-25 ENCOUNTER — Other Ambulatory Visit: Payer: Self-pay

## 2020-11-26 ENCOUNTER — Other Ambulatory Visit (INDEPENDENT_AMBULATORY_CARE_PROVIDER_SITE_OTHER): Payer: BC Managed Care – PPO

## 2020-11-26 DIAGNOSIS — E559 Vitamin D deficiency, unspecified: Secondary | ICD-10-CM | POA: Diagnosis not present

## 2020-11-26 DIAGNOSIS — H02423 Myogenic ptosis of bilateral eyelids: Secondary | ICD-10-CM | POA: Diagnosis not present

## 2020-11-26 DIAGNOSIS — E782 Mixed hyperlipidemia: Secondary | ICD-10-CM | POA: Diagnosis not present

## 2020-11-26 DIAGNOSIS — H02831 Dermatochalasis of right upper eyelid: Secondary | ICD-10-CM | POA: Diagnosis not present

## 2020-11-26 DIAGNOSIS — H02413 Mechanical ptosis of bilateral eyelids: Secondary | ICD-10-CM | POA: Diagnosis not present

## 2020-11-26 DIAGNOSIS — E042 Nontoxic multinodular goiter: Secondary | ICD-10-CM

## 2020-11-26 DIAGNOSIS — R609 Edema, unspecified: Secondary | ICD-10-CM

## 2020-11-26 DIAGNOSIS — H02834 Dermatochalasis of left upper eyelid: Secondary | ICD-10-CM | POA: Diagnosis not present

## 2020-11-26 DIAGNOSIS — I10 Essential (primary) hypertension: Secondary | ICD-10-CM | POA: Diagnosis not present

## 2020-11-26 DIAGNOSIS — H0279 Other degenerative disorders of eyelid and periocular area: Secondary | ICD-10-CM | POA: Diagnosis not present

## 2020-11-26 LAB — HEPATIC FUNCTION PANEL
ALT: 14 U/L (ref 0–35)
AST: 21 U/L (ref 0–37)
Albumin: 3.9 g/dL (ref 3.5–5.2)
Alkaline Phosphatase: 59 U/L (ref 39–117)
Bilirubin, Direct: 0.1 mg/dL (ref 0.0–0.3)
Total Bilirubin: 0.6 mg/dL (ref 0.2–1.2)
Total Protein: 6.5 g/dL (ref 6.0–8.3)

## 2020-11-26 LAB — LIPID PANEL
Cholesterol: 256 mg/dL — ABNORMAL HIGH (ref 0–200)
HDL: 61.5 mg/dL (ref >39.00)
LDL Cholesterol: 182 mg/dL — ABNORMAL HIGH (ref 0–99)
NonHDL: 194.93
Total CHOL/HDL Ratio: 4
Triglycerides: 63 mg/dL (ref 0.0–149.0)
VLDL: 12.6 mg/dL (ref 0.0–40.0)

## 2020-11-26 LAB — CBC
HCT: 39.8 % (ref 36.0–46.0)
Hemoglobin: 13.5 g/dL (ref 12.0–15.0)
MCHC: 34 g/dL (ref 30.0–36.0)
MCV: 87.9 fl (ref 78.0–100.0)
Platelets: 216 10*3/uL (ref 150.0–400.0)
RBC: 4.53 Mil/uL (ref 3.87–5.11)
RDW: 13.6 % (ref 11.5–15.5)
WBC: 5.1 10*3/uL (ref 4.0–10.5)

## 2020-11-26 LAB — BASIC METABOLIC PANEL
BUN: 11 mg/dL (ref 6–23)
CO2: 32 mEq/L (ref 19–32)
Calcium: 10.4 mg/dL (ref 8.4–10.5)
Chloride: 104 mEq/L (ref 96–112)
Creatinine, Ser: 0.73 mg/dL (ref 0.40–1.20)
GFR: 86.33 mL/min (ref >60.00)
Glucose, Bld: 88 mg/dL (ref 70–99)
Potassium: 4.2 mEq/L (ref 3.5–5.1)
Sodium: 139 mEq/L (ref 135–145)

## 2020-11-26 LAB — TSH: TSH: 4.36 u[IU]/mL (ref 0.35–4.50)

## 2020-11-27 ENCOUNTER — Telehealth: Payer: Self-pay | Admitting: Pharmacist

## 2020-11-27 NOTE — Telephone Encounter (Signed)
Called pt and LVM for her to return call. Calling to see if patient has stopped taking Repatha. She called about a year ago about a skin reaction to Repatha. She was going to try to pretreat and continue medication. Based off pf labs, patient most likely d/c Repatha.  Can discuss trying Praluent or Leqvio

## 2020-11-27 NOTE — Telephone Encounter (Signed)
Patient called back. She has not been on Repatha, but it was not because of injection site reaction. That had actually subsided. She lost insurance with COVID so she has not been taking. She has insurance again now and would like to resume. Advised I will work on the prior [place] and call her when its approved.

## 2020-11-28 NOTE — Telephone Encounter (Signed)
Submitted for PA for Repatha waiting on determination, but appears they prefer Praluent. Working on [employer] for [employer]- waiting for questions

## 2020-11-30 LAB — VITAMIN D 1,25 DIHYDROXY
Vitamin D 1, 25 (OH)2 Total: 33 pg/mL (ref 18–72)
Vitamin D2 1, 25 (OH)2: <8 pg/mL
Vitamin D3 1, 25 (OH)2: 33 pg/mL

## 2020-12-02 DIAGNOSIS — M1711 Unilateral primary osteoarthritis, right knee: Secondary | ICD-10-CM | POA: Diagnosis not present

## 2020-12-02 MED ORDER — PRALUENT 150 MG/ML ~~LOC~~ SOAJ: 1.0000 "pen " | SUBCUTANEOUS | 11 refills | Status: DC

## 2020-12-02 NOTE — Telephone Encounter (Signed)
Praluent 150mg  q 14 weeks has been approved through 03/01/21 Rx sent to [facility]  Copay card activated and info called to pharmacy BIN [id number] PCN : CN GRP: [id number] ID: [id number]  Called pt to let her know insurance made [place] change to Praluent. Left VM for pt to call back.

## 2020-12-02 NOTE — Telephone Encounter (Signed)
Patient called back. I advised her of below. She said she got a text from pharmacy that cost would be >$400. I told her that maybe the pharmacy had not processed that copay card yet. She was going to call the pharmacy and call me back if there is any issues.  Will plan for labs in 3 months is everything with cost is sorted out.

## 2020-12-04 ENCOUNTER — Telehealth: Payer: Self-pay

## 2020-12-04 NOTE — Telephone Encounter (Signed)
Pt states she has burning, urine frequency, and discomfort x 3 days. Pt states would like to know what she could do for this because she is unable to come in for a ov due to her work schedule. Pt states she can come in to leave a urine sample if needed but it would have to be first thing in the morning due to work. Please advise

## 2020-12-04 NOTE — Telephone Encounter (Signed)
Made in error

## 2020-12-05 DIAGNOSIS — N3090 Cystitis, unspecified without hematuria: Secondary | ICD-10-CM | POA: Diagnosis not present

## 2020-12-05 DIAGNOSIS — N3001 Acute cystitis with hematuria: Secondary | ICD-10-CM | POA: Diagnosis not present

## 2020-12-05 NOTE — Telephone Encounter (Signed)
Enter order for urinalysis. If she can not schedule a lab appt, then she will need to utilize the urgent care clinic.

## 2020-12-10 ENCOUNTER — Encounter: Payer: Self-pay | Admitting: Nurse Practitioner

## 2020-12-10 NOTE — Telephone Encounter (Signed)
Pt went to urgent care.

## 2020-12-12 ENCOUNTER — Telehealth: Payer: Self-pay | Admitting: Nurse Practitioner

## 2020-12-12 DIAGNOSIS — D2262 Melanocytic nevi of left upper limb, including shoulder: Secondary | ICD-10-CM | POA: Diagnosis not present

## 2020-12-12 DIAGNOSIS — D2261 Melanocytic nevi of right upper limb, including shoulder: Secondary | ICD-10-CM | POA: Diagnosis not present

## 2020-12-12 DIAGNOSIS — D225 Melanocytic nevi of trunk: Secondary | ICD-10-CM | POA: Diagnosis not present

## 2020-12-12 DIAGNOSIS — L570 Actinic keratosis: Secondary | ICD-10-CM | POA: Diagnosis not present

## 2020-12-12 NOTE — Telephone Encounter (Signed)
LVM to CB.

## 2021-01-01 ENCOUNTER — Other Ambulatory Visit: Payer: BC Managed Care – PPO

## 2021-01-22 ENCOUNTER — Other Ambulatory Visit: Payer: Self-pay

## 2021-01-22 ENCOUNTER — Ambulatory Visit
Admission: RE | Admit: 2021-01-22 | Discharge: 2021-01-22 | Disposition: A | Discharge status: Home / Self Care | Payer: BC Managed Care – PPO | Source: Ambulatory Visit | Attending: Nurse Practitioner | Admitting: Nurse Practitioner

## 2021-01-22 DIAGNOSIS — Z780 Asymptomatic menopausal state: Secondary | ICD-10-CM | POA: Insufficient documentation

## 2021-01-22 DIAGNOSIS — Z1231 Encounter for screening mammogram for malignant neoplasm of breast: Secondary | ICD-10-CM | POA: Diagnosis not present

## 2021-01-22 DIAGNOSIS — M8589 Other specified disorders of bone density and structure, multiple sites: Secondary | ICD-10-CM | POA: Diagnosis not present

## 2021-02-04 ENCOUNTER — Telehealth: Payer: Self-pay

## 2021-02-04 DIAGNOSIS — E782 Mixed hyperlipidemia: Secondary | ICD-10-CM

## 2021-02-04 NOTE — Telephone Encounter (Signed)
-----   Message from Leeroy Bock, [place] sent at 02/04/2021 11:00 AM EDT ----- Got a call from [facility] was a reauthorization request but pt hasn't had any labs since starting therapy so they can't approve her. Can you get her scheduled for follow up labs? I told her to cancel the request so that a new PA can be submitted after we have labs, otherwise they'll deny her and we'll have to submit an appeals

## 2021-02-04 NOTE — Telephone Encounter (Signed)
lmom for Lipid and hepatic labs need scheduling must be fasting

## 2021-02-06 ENCOUNTER — Telehealth: Payer: Self-pay

## 2021-02-06 NOTE — Telephone Encounter (Signed)
Patient never started Praluent. Pharmacy said they didn't have Rx even though it was sent and copay information was verbally called to them.  Will resubmit PA as a new start- will keep patient updated and call her when complete

## 2021-02-06 NOTE — Telephone Encounter (Signed)
-----   Message from Leeroy Bock, [place] sent at 02/05/2021  3:00 PM EDT ----- Try again thursday

## 2021-02-06 NOTE — Telephone Encounter (Signed)
Awaiting questions on covermymeds

## 2021-02-06 NOTE — Telephone Encounter (Signed)
Samantha Stark (Key: [id number])

## 2021-02-06 NOTE — Telephone Encounter (Signed)
lmom for pt to call to schedule labs

## 2021-02-11 MED ORDER — REPATHA SURECLICK 140 MG/ML ~~LOC~~ SOAJ: 1.0000 "pen " | SUBCUTANEOUS | 11 refills | Status: DC

## 2021-02-11 NOTE — Telephone Encounter (Signed)
PA for Repatha approved through 05/14/21. I called [facility] to follow up on copay card. They stated that they cannot fill bc they are not reimbursed enough. They do not have copay card on file. I called patient to let her know. She requested Rx be sent to [facility] on [address]. I advised she call [phone] for her copay card info.

## 2021-02-11 NOTE — Addendum Note (Signed)
Addended by: Marcelle Overlie D on: 02/11/2021 11:08 AM   Modules accepted: Orders

## 2021-03-31 DIAGNOSIS — E782 Mixed hyperlipidemia: Secondary | ICD-10-CM | POA: Diagnosis not present

## 2021-03-31 DIAGNOSIS — F418 Other specified anxiety disorders: Secondary | ICD-10-CM | POA: Diagnosis not present

## 2021-03-31 DIAGNOSIS — I10 Essential (primary) hypertension: Secondary | ICD-10-CM | POA: Diagnosis not present

## 2021-04-09 ENCOUNTER — Encounter: Payer: Self-pay | Admitting: Gastroenterology

## 2021-04-24 ENCOUNTER — Telehealth: Payer: Self-pay | Admitting: Cardiovascular Disease

## 2021-04-24 NOTE — Telephone Encounter (Signed)
Lmomed the pt that labs are needed if they wish to continue taking the repatha as it was denied because labs are required

## 2021-04-24 NOTE — Telephone Encounter (Signed)
Currently working an Production designer, theatre/television/film

## 2021-04-24 NOTE — Telephone Encounter (Signed)
I called the pt to schedule lipid labs but the pt stated that she wasn't on repatha but praluent instead but the spreadsheet and her medlist stated repatha so idk who sent the rx or what to do from this point if infact she was just switched to praluent she stated that it was to early to get labs. Please advise me on which pa and which med she is on and what to do because I am confused.

## 2021-04-24 NOTE — Telephone Encounter (Signed)
New message:      Samantha Stark from [facility] said patient's Repatha was denied. He said a fax will follow this denial. Jeani Hawking Via is not in the office, encounter was sent to [facility] Triage per nurse in Triage.

## 2021-05-14 ENCOUNTER — Ambulatory Visit: Payer: BC Managed Care – PPO | Admitting: Nurse Practitioner

## 2021-05-15 NOTE — Telephone Encounter (Signed)
Patient will need updated labs. She does not have any labs on therapy. I have called pt and left VM for her to call back. Will need to get her in to lab for lipid labs.

## 2021-05-15 NOTE — Telephone Encounter (Signed)
Pa for repatha submitted Persephanie Jemison (Key: [id number])

## 2021-05-15 NOTE — Telephone Encounter (Signed)
Pharmd review: I was working on a pa for repatha that was denied onlyto find this note to see that she has been on praluent 150. I am unsure of which med she should be on and cannot proceed until clarification is made. Routing to pharmd pool

## 2021-05-15 NOTE — Telephone Encounter (Signed)
The note you are referring to is from Feb. There is more recent note from April/May where I document Repatha. Patient has claims data for [place]. She has been filled Repatha twice in May and once in July.

## 2021-05-27 NOTE — Telephone Encounter (Signed)
Samantha Stark is calling stating he is needing the missing information for this PA. He states they need the diagnosis, lab value, and the date the labs were taken.

## 2021-05-28 ENCOUNTER — Other Ambulatory Visit: Payer: BC Managed Care – PPO

## 2021-05-28 NOTE — Telephone Encounter (Signed)
Received a denial request for Repatha since pt has not had labs checked in past 3 months. Unsure why new PA request was submitted without new labs since we already received a previous denial for this reason.

## 2021-05-28 NOTE — Telephone Encounter (Signed)
Lmomed the pt to call to reschedule labs as she has missed the appt

## 2021-05-28 NOTE — Telephone Encounter (Signed)
We are currently addressing this in another encounter as the pt is in need of lab work in order to proceed with prior authorization

## 2021-05-28 NOTE — Telephone Encounter (Signed)
Looks like a scheduler got this scheduled for 05/29/21 at [facility] for the fasting labs

## 2021-05-29 ENCOUNTER — Other Ambulatory Visit: Payer: BC Managed Care – PPO

## 2021-05-29 ENCOUNTER — Other Ambulatory Visit: Payer: Self-pay

## 2021-05-29 DIAGNOSIS — E782 Mixed hyperlipidemia: Secondary | ICD-10-CM | POA: Diagnosis not present

## 2021-05-29 LAB — HEPATIC FUNCTION PANEL
ALT: 20 IU/L (ref 0–32)
AST: 28 IU/L (ref 0–40)
Albumin: 4.1 g/dL (ref 3.8–4.8)
Alkaline Phosphatase: 70 IU/L (ref 44–121)
Bilirubin Total: 0.5 mg/dL (ref 0.0–1.2)
Bilirubin, Direct: 0.12 mg/dL (ref 0.00–0.40)
Total Protein: 6.3 g/dL (ref 6.0–8.5)

## 2021-05-29 LAB — LIPID PANEL
Chol/HDL Ratio: 3.1 ratio (ref 0.0–4.4)
Cholesterol, Total: 190 mg/dL (ref 100–199)
HDL: 61 mg/dL (ref >39)
LDL Chol Calc (NIH): 119 mg/dL — ABNORMAL HIGH (ref 0–99)
Triglycerides: 51 mg/dL (ref 0–149)
VLDL Cholesterol Cal: 10 mg/dL (ref 5–40)

## 2021-05-30 NOTE — Telephone Encounter (Signed)
Samantha Stark (Key: [id number]) - [id number] Repatha SureClick '140MG'$ /ML auto-injectors    Pa submitted for repatha as requested by Marcelle Overlie rph

## 2021-05-30 NOTE — Telephone Encounter (Signed)
Labs are back Baseline LDL 221, labs checked on Repatha, needed for insurance reauthorization - 46% LDL reduction from baseline. Please submit for reauth

## 2021-06-10 ENCOUNTER — Other Ambulatory Visit: Payer: BC Managed Care – PPO

## 2021-06-30 ENCOUNTER — Other Ambulatory Visit: Payer: Self-pay | Admitting: Nurse Practitioner

## 2021-06-30 DIAGNOSIS — I10 Essential (primary) hypertension: Secondary | ICD-10-CM

## 2021-10-14 DIAGNOSIS — Z23 Encounter for immunization: Secondary | ICD-10-CM | POA: Diagnosis not present

## 2021-10-14 DIAGNOSIS — E782 Mixed hyperlipidemia: Secondary | ICD-10-CM | POA: Diagnosis not present

## 2021-10-14 DIAGNOSIS — I10 Essential (primary) hypertension: Secondary | ICD-10-CM | POA: Diagnosis not present

## 2021-10-14 DIAGNOSIS — Z0000 Encounter for general adult medical examination without abnormal findings: Secondary | ICD-10-CM | POA: Diagnosis not present

## 2021-10-14 DIAGNOSIS — M8589 Other specified disorders of bone density and structure, multiple sites: Secondary | ICD-10-CM | POA: Diagnosis not present

## 2021-10-14 DIAGNOSIS — Z8639 Personal history of other endocrine, nutritional and metabolic disease: Secondary | ICD-10-CM | POA: Diagnosis not present

## 2021-10-14 DIAGNOSIS — R002 Palpitations: Secondary | ICD-10-CM | POA: Diagnosis not present

## 2021-10-23 DIAGNOSIS — E038 Other specified hypothyroidism: Secondary | ICD-10-CM | POA: Diagnosis not present

## 2021-10-23 DIAGNOSIS — Z8349 Family history of other endocrine, nutritional and metabolic diseases: Secondary | ICD-10-CM | POA: Diagnosis not present

## 2021-10-23 DIAGNOSIS — E063 Autoimmune thyroiditis: Secondary | ICD-10-CM | POA: Diagnosis not present

## 2021-10-23 DIAGNOSIS — E041 Nontoxic single thyroid nodule: Secondary | ICD-10-CM | POA: Diagnosis not present

## 2021-10-27 ENCOUNTER — Telehealth: Payer: Self-pay | Admitting: Cardiovascular Disease

## 2021-10-27 MED ORDER — METOPROLOL SUCCINATE ER 25 MG PO TB24: 25.0000 mg | ORAL_TABLET | ORAL | 0 refills | Status: DC | PRN

## 2021-10-27 NOTE — Telephone Encounter (Signed)
Per Dr. Acie Fredrickson, okay to refill metoprolol until patient can be seen on Thursday.

## 2021-10-27 NOTE — Telephone Encounter (Signed)
Patient complaining of having palpitations off and on, feeling tired and having a cough since she had COVID 3 weeks ago. Patient's BP 130/78 and HR 80. Patient was last seen in our office in 2019 and at that time patient was prescribed metoprolol succinate 25 mg to take as needed for palpitations. Patient stated she has not had a palpitation for a couple of years until now. Patient has an appointment with APP this week. Will forward to DOD, Dr. Acie Fredrickson to see if we can send in refill for metoprolol.

## 2021-10-27 NOTE — Telephone Encounter (Signed)
Patient c/o Palpitations:  High priority if patient c/o lightheadedness, shortness of breath, or chest pain  How long have you had palpitations/irregular HR/ Afib? Are you having the symptoms now? 3 weeks ago/ no off and on throughout the day worsening at night  Are you currently experiencing lightheadedness, SOB or CP? no  Do you have a history of afib (atrial fibrillation) or irregular heart rhythm? yes  Have you checked your BP or HR? (document readings if available): yes   Are you experiencing any other symptoms? Cough possibly covid related from

## 2021-10-30 ENCOUNTER — Encounter (HOSPITAL_BASED_OUTPATIENT_CLINIC_OR_DEPARTMENT_OTHER): Payer: Self-pay | Admitting: Family

## 2021-10-30 ENCOUNTER — Ambulatory Visit (INDEPENDENT_AMBULATORY_CARE_PROVIDER_SITE_OTHER): Payer: BC Managed Care – PPO | Admitting: Family

## 2021-10-30 ENCOUNTER — Other Ambulatory Visit: Payer: Self-pay

## 2021-10-30 VITALS — BP 120/80 | HR 64 | Ht 62.0 in | Wt 156.0 lb

## 2021-10-30 DIAGNOSIS — R002 Palpitations: Secondary | ICD-10-CM

## 2021-10-30 DIAGNOSIS — E782 Mixed hyperlipidemia: Secondary | ICD-10-CM | POA: Diagnosis not present

## 2021-10-30 DIAGNOSIS — I10 Essential (primary) hypertension: Secondary | ICD-10-CM

## 2021-10-30 MED ORDER — METOPROLOL SUCCINATE ER 25 MG PO TB24: 25.0000 mg | ORAL_TABLET | Freq: Two times a day (BID) | ORAL | 3 refills | Status: AC

## 2021-10-30 NOTE — Progress Notes (Signed)
Office Visit    Patient Name: Samantha Stark Date of Encounter: 10/30/2021  PCP:  Mckinley Jewel, MD   [place]  Cardiologist:  Jenkins Rouge, MD  Advanced Practice Provider:  No care team member to display Electrophysiologist:  None   Chief Complaint    Samantha Stark is a 66 y.o. 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 [name]   Counseling on health promotion and disease prevention 06/27/2016   Diverticulosis    Encounter for counseling for care management of patient with chronic conditions and complex health needs using nurse-based model 06/27/2016   Hyperlipidemia    Hypertension    MVP (mitral valve prolapse)    Overweight (BMI 25.0-29.9) 06/05/2016   Screening for multiple conditions 08/28/2016   Seasonal allergies    Past Surgical History:  Procedure Laterality Date   ABDOMINAL HYSTERECTOMY     COLONOSCOPY  2008 & 2011   [facility], negative except for Tics   KNEE ARTHROSCOPY Bilateral 2002, 2007   PARTIAL HYSTERECTOMY  1993   ROTATOR CUFF REPAIR Right 2010   SPINE SURGERY  1992   Lumbar laminectomy L5-S1   TONSILLECTOMY  1976   TUBAL LIGATION      Allergies  Allergies  Allergen Reactions   Iodine Anaphylaxis   Shrimp [Shellfish Allergy]     Angioedema caused by shrimp as well as oysters. EpiPen current   Amlodipine Besylate     REACTION: edema, fatigue   Calcium Channel Blockers     REACTION: edema , fatigue   Cephalexin     REACTION: diarrhea   Clobetasol Other (See Comments)    Takes pigment out of skin   Codeine     REACTION: nausea   Coricidin D Cold-Flu-Sinus [Chlorphen-Pe-Acetaminophen] Hives   Crestor [Rosuvastatin]     Per  patient gave her leg cramps    Erythromycin Diarrhea   Hydrochlorothiazide W-Triamterene     REACTION: low potassium   Morphine Nausea And Vomiting   Phenylephrine Hcl Hives   Statins     Leg cramps with Lipitor & Pravachol    History of Present Illness    Samantha Stark is a 66 y.o. female with a hx of palpitations, hypothyroidism, hypertension, hyperlipidemia last seen 10/10/18.  She has a history of palpitations with normal Myoview and event monitors in the past.  Symptoms previously related to travel and job stress.  She has been intolerant to most statins including Crestor, Lipitor, pravastatin.  Additionally intolerant to Zetia with myalgias.  She had ETT 05/07/2017 which was unremarkable.  She was last seen 10/10/2018 by Dr. [name].  Her palpitations were infrequent and overall not bothersome.  Her blood pressure was well controlled.  She was referred to lipid clinic for consideration of PCSK9. 10/2018 prior auth for Praluent and Repatha denied. She was recommended for Bempedoic acid but preferred to trial Red Yeast Rice. LDL subsequently 221. She had new insurance and was approved for Repatha.   She presents today for follow up. She had COVID around December 29th. Treated with symptom management at home. Still with residual cough which is improving. PCP has provided  her Tessalon Pearles. Notes palpitations which are usually triggered by fatigue, stress, caffeine. She has reduced caffeine to one cup of coffee per day. She usually enjoys yoga and is hoping to resume. Recently diagnosed with Hashimoto's. Discussed that this could contribute to palpitations.   EKGs/Labs/Other Studies Reviewed:   The following studies were reviewed today:  EKG:  EKG is ordered today.  The ekg ordered today demonstrates NSR 64 bpm with no acute ST/T wave changes.   Recent Labs: 11/26/2020: BUN 11; Creatinine, Ser 0.73; Hemoglobin 13.5; Platelets 216.0; Potassium 4.2; Sodium 139; TSH 4.36 05/29/2021: ALT  20  Recent Lipid Panel    Component Value Date/Time   CHOL 190 05/29/2021 0807   CHOL 285 (H) 05/15/2016 0826   CHOL 259 (H) 05/15/2015 0900   TRIG 51 05/29/2021 0807   TRIG 102 05/15/2016 0826   TRIG 119 05/15/2015 0900   HDL 61 05/29/2021 0807   HDL 55 05/15/2016 0826   HDL 52 05/15/2015 0900   CHOLHDL 3.1 05/29/2021 0807   CHOLHDL 4 11/26/2020 0806   VLDL 12.6 11/26/2020 0806   LDLCALC 119 (H) 05/29/2021 0807   LDLCALC 210 (H) 05/15/2016 0826   LDLCALC 183 (H) 05/15/2015 0900   LDLDIRECT 139.1 05/12/2013 1004   Home Medications   Current Meds  Medication Sig   aspirin EC 81 MG tablet Take 1 tablet (81 mg total) by mouth daily.   cyclobenzaprine (FLEXERIL) 5 MG tablet Take 1-2 tablets (5-10 mg total) by mouth 3 (three) times daily as needed for muscle spasms.   EPINEPHrine 0.3 mg/0.3 mL IJ SOAJ injection Inject 0.3 mLs (0.3 mg total) into the muscle as directed.   escitalopram (LEXAPRO) 10 MG tablet Take 1 tablet (10 mg total) by mouth daily.   Evolocumab (REPATHA SURECLICK) 992 MG/ML SOAJ Inject 1 pen into the skin every 14 (fourteen) days.   levothyroxine (SYNTHROID) 25 MCG tablet Take 25 mcg by mouth daily.   lisinopril (ZESTRIL) 20 MG tablet Take 1 tablet (20 mg total) by mouth daily.   metoprolol succinate (TOPROL-XL) 25 MG 24 hr tablet Take 1 tablet (25 mg total) by mouth as needed (palpitations).    Review of Systems      All other systems reviewed and are otherwise negative except as noted above.  Physical Exam    VS:  BP 120/80 (BP Location: Left Arm, Patient Position: Sitting, Cuff Size: Normal)    Pulse 64    Ht 5\' 2"  (1.575 m)    Wt 156 lb (70.8 kg)    SpO2 96%    BMI 28.53 kg/m  , BMI Body mass index is 28.53 kg/m.  Wt Readings from Last 3 Encounters:  10/30/21 156 lb (70.8 kg)  11/07/20 157 lb 3.2 oz (71.3 kg)  01/16/19 157 lb (71.2 kg)     GEN: Well nourished, well developed, in no acute distress. HEENT: normal. Neck: Supple, no JVD, carotid  bruits, or masses. Cardiac: RRR, no murmurs, rubs, or gallops. No clubbing, cyanosis, edema.  Radials/PT 2+ and equal bilaterally.  Respiratory:  Respirations regular and unlabored, clear to auscultation bilaterally. GI: Soft, nontender, nondistended. MS: No deformity or atrophy. Skin: Warm and dry, no rash. Neuro:  Strength and sensation are intact. Psych: Normal affect.  Assessment & Plan    Palpitations - Reports recurrent palpitations. EKG today NSR. Likely triggers untreated Hashimoto's thyroiditis - recently prescribed levothyroxine by endocrinology and encouraged to start. She will do so tomorrow. Labs 10/14/21 with normal Hb, electrolytes. Consider recent  stress of illness and caffeine as contributory. She has reduced her caffeine intake. Discussed possible ZIO monitor - we agreed to trial medication changes first and if palpitations are persistent will order ZIO to be mailed to her home. Will check in via MyChart message in 2 weeks.   HTN - BP well controlled. Continue current antihypertensive regimen.    HLD - Statin (Pravastatin, Atorvastatin, Rosuvastatin) and Zetia intolerant. 05/2021 LDL 119. Continue Repatha.   Disposition: Follow up  in April as scheduled  with Jenkins Rouge, MD   Signed, Loel Dubonnet, NP 10/30/2021, 9:27 AM [place]

## 2021-10-30 NOTE — Patient Instructions (Addendum)
Medication Instructions:  Your physician has recommended you make the following change in your medication:   Start:  Metoprolol Succinate 25mg  Twice daily   *If you need a refill on your cardiac medications before your next appointment, please call your pharmacy*   Lab Work: None ordered today   Testing/Procedures: None ordered today    Follow-Up: At [facility], you and your health needs are our priority.  As part of our continuing mission to provide you with exceptional heart care, we have created designated Provider Care Teams.  These Care Teams include your primary Cardiologist (physician) and Advanced Practice Providers (APPs -  Physician Assistants and Nurse Practitioners) who all work together to provide you with the care you need, when you need it.  We recommend signing up for the patient portal called "MyChart".  Sign up information is provided on this After Visit Summary.  MyChart is used to connect with patients for Virtual Visits (Telemedicine).  Patients are able to view lab/test results, encounter notes, upcoming appointments, etc.  Non-urgent messages can be sent to your provider as well.   To learn more about what you can do with MyChart, go to [email or web address].    Your next appointment:   Follow up as scheduled with Dr. [name]    Other Instructions Loel Dubonnet, NP will send you a MyChart message in 2 weeks to check in on symptoms. If your symptoms are not improving we may consider ordering a ZIO monitor.

## 2021-11-07 DIAGNOSIS — E041 Nontoxic single thyroid nodule: Secondary | ICD-10-CM | POA: Diagnosis not present

## 2021-11-17 ENCOUNTER — Encounter (HOSPITAL_BASED_OUTPATIENT_CLINIC_OR_DEPARTMENT_OTHER): Payer: Self-pay

## 2021-12-04 ENCOUNTER — Encounter (HOSPITAL_BASED_OUTPATIENT_CLINIC_OR_DEPARTMENT_OTHER): Payer: Self-pay

## 2022-01-20 NOTE — Progress Notes (Signed)
? ?Office Visit  ?  ?Patient Name: Samantha Stark ?Date of Encounter: 01/28/2022 ? ?PCP:  Mckinley Jewel, MD ?  ?[place]  ?Cardiologist:  Jenkins Rouge, MD  ?Advanced Practice Provider:  No care team member to display ?Electrophysiologist:  None  ? ?Chief Complaint  ?  ?Samantha Stark is a 66 y.o. female with a hx of palpitations, hypothyroidism, hypertension, hyperlipidemia  presents today for palpitations I have not seen her since 2019 ? ?Past Medical History  ?  ?Past Medical History:  ?Diagnosis Date  ? Allergy   ? Anxiety   ? Situational  ? Bug bites 04/25/2019  ? Cancer [facility]   ? scalp  ? Chest pain 2006 & 2008  ? Negative nuclear stress study, Dr. [name]  ? Counseling on health promotion and disease prevention 06/27/2016  ? Diverticulosis   ? Encounter for counseling for care management of patient with chronic conditions and complex health needs using nurse-based model 06/27/2016  ? Hyperlipidemia   ? Hypertension   ? MVP (mitral valve prolapse)   ? Overweight (BMI 25.0-29.9) 06/05/2016  ? Screening for multiple conditions 08/28/2016  ? Seasonal allergies   ? ?Past Surgical History:  ?Procedure Laterality Date  ? ABDOMINAL HYSTERECTOMY    ? COLONOSCOPY  2008 & 2011  ? [facility], negative except for Tics  ? KNEE ARTHROSCOPY Bilateral 2002, 2007  ? PARTIAL HYSTERECTOMY  1993  ? ROTATOR CUFF REPAIR Right 2010  ? [place]  ? Lumbar laminectomy L5-S1  ? TONSILLECTOMY  1976  ? TUBAL LIGATION    ? ? ?Allergies ? ?Allergies  ?Allergen Reactions  ? Iodine Anaphylaxis  ? Shrimp [Shellfish Allergy]   ?  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 EpiPen current  ? Amlodipine Besylate   ?  REACTION: edema, fatigue  ? Calcium Channel Blockers   ?  REACTION: edema , fatigue  ? Cephalexin   ?  REACTION: diarrhea  ? Clobetasol Other (See Comments)  ?  Takes pigment out of skin  ? Codeine   ?  REACTION: nausea  ? Coricidin D Cold-Flu-Sinus [Chlorphen-Pe-Acetaminophen] Hives  ?  Crestor [Rosuvastatin]   ?  Per patient gave her leg cramps   ? Erythromycin Diarrhea  ? Hydrochlorothiazide W-Triamterene   ?  REACTION: low potassium  ? Morphine Nausea And Vomiting  ? Phenylephrine Hcl Hives  ? Statins   ?  Leg cramps with Lipitor & Pravachol  ? ? ?History of Present Illness  ?  ?Samantha Stark is a 66 y.o. female with a hx of palpitations, hypothyroidism, hypertension, hyperlipidemia   ? ?She has a history of palpitations with normal Myoview and event monitors in the past.  Symptoms previously related to travel and job stress.  She has been intolerant to most statins including Crestor, Lipitor, pravastatin.  Additionally intolerant to Zetia with myalgias.  She had ETT 05/07/2017 which was unremarkable. ? ?She is now on Repatha for HLD  ? ?Notes palpitations which are usually triggered by fatigue, stress, caffeine. She has reduced caffeine to one cup of coffee per day. She usually enjoys yoga and is hoping to resume. Diagnosed with Hashimoto's. Discussed that this could contribute to palpitations.  ? ?Discussed using Armor Thyroid for replacement instead ? ?She works remotely for [employer] and still stressful. Line dancing and has trip to [place] planned ? ?EKGs/Labs/Other Studies Reviewed:  ? ?The following studies were reviewed today: ? ?EKG:  EKG 10/30/21  demonstrates NSR 64 bpm with no acute ST/T wave changes.  ? ?Recent Labs: ?05/29/2021: ALT 20  ?Recent Lipid Panel ?   ?Component Value Date/Time  ? CHOL 190 05/29/2021 0807  ? CHOL 285 (H) 05/15/2016 0826  ? CHOL 259 (H) 05/15/2015 0900  ? TRIG 51 05/29/2021 0807  ? TRIG 102 05/15/2016 0826  ? TRIG 119 05/15/2015 0900  ? HDL 61 05/29/2021 0807  ? HDL 55 05/15/2016 0826  ? HDL 52 05/15/2015 0900  ? CHOLHDL 3.1 05/29/2021 0807  ? CHOLHDL 4 11/26/2020 0806  ? VLDL 12.6 11/26/2020 0806  ? LDLCALC 119 (H) 05/29/2021 8841  ? LDLCALC 210 (H) 05/15/2016 6606  ? LDLCALC 183 (H) 05/15/2015 0900  ? LDLDIRECT 139.1 05/12/2013 1004  ? ?Home Medications   ? ?No outpatient medications have been marked as taking for the 01/28/22 encounter (Appointment) with Josue Hector, MD.  ?  ?Review of Systems  ?    ?All other systems reviewed and are otherwise negative except as noted above. ? ?Physical Exam  ?  ?VS:  There were no vitals taken for this visit. , BMI There is no height or weight on file to calculate BMI. ? ?Wt Readings from Last 3 Encounters:  ?10/30/21 156 lb (70.8 kg)  ?11/07/20 157 lb 3.2 oz (71.3 kg)  ?01/16/19 157 lb (71.2 kg)  ?  ? ?Affect appropriate ?Healthy:  appears stated age ?HEENT: normal ?Neck supple with no adenopathy ?JVP normal no bruits no thyromegaly ?Lungs clear with no wheezing and good diaphragmatic motion ?Heart:  S1/S2 no murmur, no rub, gallop or click ?PMI normal ?Abdomen: benighn, BS positve, no tenderness, no AAA ?no bruit.  No HSM or HJR ?Distal pulses intact with no bruits ?No edema ?Neuro non-focal ?Skin warm and dry ?No muscular weakness ? ? ?Assessment & Plan  ?  ?Palpitations - benign no evidence of structural HD ECG with NSR may be related to her thyroid dx no need for monitor   ? ?HTN - BP well controlled. Continue current antihypertensive regimen.   ? ?HLD - Statin (Pravastatin, Atorvastatin, Rosuvastatin) and Zetia intolerant. 05/2021 LDL 119. Continue Repatha. Calcium score to risk stratify ?Thyroid:  On low dose synthroid TSH has been normal  ? ?Calcium score ? ? ?Disposition: Follow up in a year   ? ?Signed, ?Jenkins Rouge, MD ?01/28/2022, 8:56 AM ?[place] ?

## 2022-01-28 ENCOUNTER — Ambulatory Visit: Payer: 59 | Admitting: Cardiovascular Disease

## 2022-01-28 VITALS — BP 138/76 | HR 72 | Ht 61.0 in | Wt 160.0 lb

## 2022-01-28 DIAGNOSIS — I10 Essential (primary) hypertension: Secondary | ICD-10-CM

## 2022-01-28 DIAGNOSIS — E782 Mixed hyperlipidemia: Secondary | ICD-10-CM | POA: Diagnosis not present

## 2022-01-28 DIAGNOSIS — R002 Palpitations: Secondary | ICD-10-CM

## 2022-01-28 NOTE — Patient Instructions (Signed)
Medication Instructions:  ?Your physician recommends that you continue on your current medications as directed. Please refer to the Current Medication list given to you today. ? ?*If you need a refill on your cardiac medications before your next appointment, please call your pharmacy* ? ? ?Lab Work: ?If you have labs (blood work) drawn today and your tests are completely normal, you will receive your results only by: ?MyChart Message (if you have MyChart) OR ?A paper copy in the mail ?If you have any lab test that is abnormal or we need to change your treatment, we will call you to review the results. ? ? ?Testing/Procedures: ?Cardiac CT scanning for Calcium score, (CAT scanning), is a noninvasive, special x-ray that produces cross-sectional images of the body using x-rays and a computer. CT scans help physicians diagnose and treat medical conditions. For some CT exams, a contrast material is used to enhance visibility in the area of the body being studied. CT scans provide greater clarity and reveal more details than regular x-ray exams. ? ?Follow-Up: ?At [facility], you and your health needs are our priority.  As part of our continuing mission to provide you with exceptional heart care, we have created designated Provider Care Teams.  These Care Teams include your primary Cardiologist (physician) and Advanced Practice Providers (APPs -  Physician Assistants and Nurse Practitioners) who all work together to provide you with the care you need, when you need it. ? ?We recommend signing up for the patient portal called "MyChart".  Sign up information is provided on this After Visit Summary.  MyChart is used to connect with patients for Virtual Visits (Telemedicine).  Patients are able to view lab/test results, encounter notes, upcoming appointments, etc.  Non-urgent messages can be sent to your provider as well.   ?To learn more about what you can do with MyChart, go to [email or web address].   ? ?Your next  appointment:   ?1 year(s) ? ?The format for your next appointment:   ?In Person ? ?Provider:   ?Jenkins Rouge, MD { ? ? ?Important Information About Sugar ? ? ? ? ?  ?

## 2022-03-27 ENCOUNTER — Inpatient Hospital Stay: Admission: RE | Admit: 2022-03-27 | Payer: 59 | Source: Ambulatory Visit

## 2022-04-01 ENCOUNTER — Other Ambulatory Visit: Payer: 59

## 2022-05-12 ENCOUNTER — Telehealth: Payer: Self-pay | Admitting: *Deleted

## 2022-05-12 NOTE — Telephone Encounter (Signed)
   Pre-operative Risk Assessment    Patient Name: Samantha Stark  DOB: 1956/06/10 MRN: [id number]      Request for Surgical Clearance    Procedure:   EXCISION OF LEFT ANKLE MASS  Date of Surgery:  Clearance TBD                                 Surgeon:  DR. Jenny Reichmann HEWITT Surgeon's Group or Practice Name:  [facility] Phone number:  [phone] Fax number:  [phone] ATTN: KERRI MAZE   Type of Clearance Requested:   - Medical ; ASA    Type of Anesthesia:  General  WITH REGIONAL BLOCK   Additional requests/questions:    Jiles Prows   05/12/2022, 6:09 PM

## 2022-05-13 NOTE — Telephone Encounter (Signed)
Call placed to pt regarding surgical clearance and the need for a tele visit.  Per pt, she isn't sure when the surgery will take place, as she travels for work and she is waiting on a new assignment at the end of September, then she will decide, but as of now, may not be til end of October or 1st of November.  I will route this to the requesting surgeon's office to make them aware and have them resend the clearance once pt has decided if / when she will have it done, as we can't have a "standing" clearance.   Pt will also call back once she has decided if / when it will be done.

## 2022-05-13 NOTE — Telephone Encounter (Signed)
   Name: Samantha Stark  DOB: Jan 15, 1956  MRN: [id number]  Primary Cardiologist: Jenkins Rouge, MD  Chart reviewed as part of pre-operative protocol coverage. Because of Malky A Skiff's past medical history and time since last visit, she will require a follow-up tele visit in order to better assess preoperative cardiovascular risk.  Pre-op covering staff: - Please schedule appointment and call patient to inform them. If patient already had an upcoming appointment within acceptable timeframe, please add "pre-op clearance" to the appointment notes so provider is aware. - Please contact requesting surgeon's office via preferred method (i.e, phone, fax) to inform them of need for appointment prior to surgery.  Per office antiplatelet protocol, patient may hold ASA x7 days prior to the procedure.  Please restart when medically safe to do so.  Elgie Collard, PA-C  05/13/2022, 8:50 AM

## 2022-05-27 NOTE — Telephone Encounter (Signed)
Our office received clearance request again today. Please see previous notes.   In regard to the previous notes, I did call the pt to see if she has decided to proceed with her surgery. If she is going to proceed with surgery she is needing a tele appt for pre op clearance .

## 2022-07-01 ENCOUNTER — Telehealth: Payer: Self-pay | Admitting: *Deleted

## 2022-07-01 NOTE — Telephone Encounter (Addendum)
   Patient Name: ANESIA BLACKWELL  DOB: 1956-07-13 MRN: [id number]  Primary Cardiologist: Jenkins Rouge, MD  Chart reviewed as part of pre-operative protocol coverage. Pharmacy clearance requested to hold ASA. Last OV reviewed from 01/2022, calcium score ordered (still pending) for risk stratification only, no angina reported. No specific contraindication to holding ASA identified from cardiac standpoint. Patient may hold ASA for up to 7 days if needed for procedure. This clearance was just received to our pre-op box today so it is unclear if patient is already holding at the direction of her surgical team. Procedure date listed as 07/06/22. Will defer to surgical team to review and advise patient on whether procedure needs to be rescheduled if aspirin needs to be held a full 7 day duration. Otherwise, no additional pre-op needs identified at this time.  Will route this bundled recommendation to requesting provider via Epic fax function. Please call with questions.   Charlie Pitter, PA-C 07/01/2022, 12:44 PM

## 2022-07-01 NOTE — Telephone Encounter (Signed)
   Pre-operative Risk Assessment    Patient Name: Samantha Stark  DOB: May 17, 1956 MRN: [id number]      Request for Surgical Clearance    Procedure:   BILATERAL UPPER EYELID BLEPHAROPLASTY  Date of Surgery:  Clearance 07/06/22                                 Surgeon:  DR. Isidoro Donning Surgeon's Group or Practice Name:  [facility] Phone number:  [phone] Fax number:  [phone]   Type of Clearance Requested:   - Pharmacy:  Hold Aspirin NOT INDICATED HOW LONG   Type of Anesthesia:  MAC   Additional requests/questions:    [name]   07/01/2022, 12:34 PM

## 2022-07-13 ENCOUNTER — Telehealth: Payer: Self-pay | Admitting: *Deleted

## 2022-07-13 NOTE — Telephone Encounter (Signed)
Primary Cardiologist:Peter [name], MD   Preoperative team, please contact this patient and set up a phone call appointment for further preoperative risk assessment. Please obtain consent and complete medication review. Thank you for your help.   Pending no symptoms of ACS, she may hold aspirin for 5-7 days if requested by surgeon.    Emmaline Life, NP-C  07/13/2022, 1:11 PM [address], [address] Office [phone] Fax [phone]

## 2022-07-13 NOTE — Telephone Encounter (Signed)
   Pre-operative Risk Assessment    Patient Name: Samantha Stark  DOB: 12/14/1955 MRN: [id number]      Request for Surgical Clearance    Procedure:   EXCISION OF LEFT ANKLE MASS  Date of Surgery:  Clearance TBD                                 Surgeon:  DR. Jenny Reichmann HEWITT Surgeon's Group or Practice Name:  [facility] Phone number:  [phone] Fax number:  [phone]   Type of Clearance Requested:   - Medical  - Pharmacy:  Hold Aspirin NOT INDICATED ON CLEARANCE    Type of Anesthesia:   GENERAL ANESTHESIA WITH REGIONAL BLOCK   Additional requests/questions:    [name]   07/13/2022, 12:19 PM

## 2022-07-13 NOTE — Telephone Encounter (Signed)
1st attempt to reach pt regarding surgical clearance and the need for a tele visit.  Left a message for pt to call back and ask for the preop team. 

## 2022-07-14 NOTE — Telephone Encounter (Signed)
Left mesasge x 2 to call back for tele pre op appt

## 2022-07-15 NOTE — Telephone Encounter (Signed)
3rd attempt to reach pt to set up tele pre op appt. Today recording vm is full, could not leave message. Will send letter to the pt to call the office to schedule a televisit for pre op clearance. Will update the requesting office we have not been able to reach the pt.   Will remove from the pre op call back until the pt calls back.

## 2022-08-21 ENCOUNTER — Telehealth: Payer: Self-pay | Admitting: Cardiovascular Disease

## 2022-08-21 ENCOUNTER — Other Ambulatory Visit: Payer: Self-pay | Admitting: Internal Medicine

## 2022-08-21 DIAGNOSIS — Z1231 Encounter for screening mammogram for malignant neoplasm of breast: Secondary | ICD-10-CM

## 2022-08-21 NOTE — Telephone Encounter (Signed)
  Per pt, she need a new order for calcium score test since the order has been closed. The radiology dept couldn't schedule her an appt

## 2022-08-21 NOTE — Telephone Encounter (Signed)
Pt returning call

## 2022-08-21 NOTE — Telephone Encounter (Signed)
Left message for patient to call back  

## 2022-08-24 ENCOUNTER — Ambulatory Visit
Admission: RE | Admit: 2022-08-24 | Discharge: 2022-08-24 | Disposition: A | Discharge status: Home / Self Care | Payer: 59 | Source: Ambulatory Visit | Attending: Internal Medicine | Admitting: Internal Medicine

## 2022-08-24 DIAGNOSIS — Z1231 Encounter for screening mammogram for malignant neoplasm of breast: Secondary | ICD-10-CM | POA: Diagnosis present

## 2022-08-31 ENCOUNTER — Encounter: Payer: Self-pay | Admitting: Cardiovascular Disease

## 2022-08-31 DIAGNOSIS — E782 Mixed hyperlipidemia: Secondary | ICD-10-CM

## 2022-08-31 DIAGNOSIS — Z7189 Other specified counseling: Secondary | ICD-10-CM

## 2022-09-14 ENCOUNTER — Ambulatory Visit: Payer: 59

## 2022-10-07 ENCOUNTER — Ambulatory Visit
Admission: RE | Admit: 2022-10-07 | Discharge: 2022-10-07 | Disposition: A | Discharge status: Home / Self Care | Payer: 59 | Source: Ambulatory Visit | Attending: Cardiovascular Disease | Admitting: Cardiovascular Disease

## 2022-10-07 DIAGNOSIS — E782 Mixed hyperlipidemia: Secondary | ICD-10-CM | POA: Insufficient documentation

## 2022-10-07 DIAGNOSIS — Z7189 Other specified counseling: Secondary | ICD-10-CM | POA: Insufficient documentation

## 2022-10-08 NOTE — Telephone Encounter (Signed)
See mychart message. CT for Calcium score was reordered.

## 2023-03-31 ENCOUNTER — Other Ambulatory Visit: Payer: Self-pay | Admitting: Family Medicine

## 2023-03-31 ENCOUNTER — Ambulatory Visit
Admission: RE | Admit: 2023-03-31 | Discharge: 2023-03-31 | Disposition: A | Discharge status: Home / Self Care | Payer: 59 | Source: Ambulatory Visit | Attending: Family Medicine | Admitting: Family Medicine

## 2023-03-31 DIAGNOSIS — R059 Cough, unspecified: Secondary | ICD-10-CM

## 2023-04-01 ENCOUNTER — Encounter: Payer: Self-pay | Admitting: Gastroenterology

## 2023-05-19 ENCOUNTER — Ambulatory Visit: Payer: 59 | Attending: Cardiovascular Disease | Admitting: Cardiovascular Disease

## 2023-05-19 ENCOUNTER — Encounter: Payer: Self-pay | Admitting: Cardiovascular Disease

## 2023-05-19 VITALS — BP 122/82 | HR 75 | Ht 61.0 in | Wt 155.0 lb

## 2023-05-19 DIAGNOSIS — E782 Mixed hyperlipidemia: Secondary | ICD-10-CM | POA: Diagnosis not present

## 2023-05-19 DIAGNOSIS — Z7189 Other specified counseling: Secondary | ICD-10-CM | POA: Diagnosis not present

## 2023-05-19 DIAGNOSIS — I10 Essential (primary) hypertension: Secondary | ICD-10-CM | POA: Diagnosis not present

## 2023-05-19 NOTE — Patient Instructions (Addendum)
Medication Instructions:  Your physician recommends that you continue on your current medications as directed. Please refer to the Current Medication list given to you today.  *If you need a refill on your cardiac medications before your next appointment, please call your pharmacy*  Lab Work: If you have labs (blood work) drawn today and your tests are completely normal, you will receive your results only by: MyChart Message (if you have MyChart) OR A paper copy in the mail If you have any lab test that is abnormal or we need to change your treatment, we will call you to review the results.  Testing/Procedures: None ordered today.  Follow-Up: At [facility], you and your health needs are our priority.  As part of our continuing mission to provide you with exceptional heart care, we have created designated Provider Care Teams.  These Care Teams include your primary Cardiologist (physician) and Advanced Practice Providers (APPs -  Physician Assistants and Nurse Practitioners) who all work together to provide you with the care you need, when you need it.  We recommend signing up for the patient portal called "MyChart".  Sign up information is provided on this After Visit Summary.  MyChart is used to connect with patients for Virtual Visits (Telemedicine).  Patients are able to view lab/test results, encounter notes, upcoming appointments, etc.  Non-urgent messages can be sent to your provider as well.   To learn more about what you can do with MyChart, go to [email or web address].    Your next appointment:   1 year(s)  Provider:   Charlton Haws, MD     You have been referred to Lipid Clinic

## 2023-05-19 NOTE — Progress Notes (Signed)
Office Visit    Patient Name: Samantha Stark Date of Encounter: 05/19/2023  PCP:  Ollen Bowl, MD   [facility]  Cardiologist:  Charlton Haws, MD  Advanced Practice Provider:  No care team member to display Electrophysiologist:  None   Chief Complaint    Samantha Stark is a 67 y.o. female with a hx of palpitations, hypothyroidism, hypertension, hyperlipidemia  presents today for palpitations I have not seen her since 2019  Past Medical History    Past Medical History:  Diagnosis Date   Allergy    Anxiety    Situational   Bug bites 04/25/2019   Cancer (HCC)    scalp   Chest pain 2006 & 2008   Negative nuclear stress study, Dr. Eden Emms   Counseling on health promotion and disease prevention 06/27/2016   Diverticulosis    Encounter for counseling for care management of patient with chronic conditions and complex health needs using nurse-based model 06/27/2016   Hyperlipidemia    Hypertension    MVP (mitral valve prolapse)    Overweight (BMI 25.0-29.9) 06/05/2016   Screening for multiple conditions 08/28/2016   Seasonal allergies    Past Surgical History:  Procedure Laterality Date   ABDOMINAL HYSTERECTOMY     COLONOSCOPY  2008 & 2011   [facility], negative except for Tics   KNEE ARTHROSCOPY Bilateral 2002, 2007   PARTIAL HYSTERECTOMY  1993   ROTATOR CUFF REPAIR Right 2010   SPINE SURGERY  1992   Lumbar laminectomy L5-S1   TONSILLECTOMY  1976   TUBAL LIGATION      Allergies  Allergies  Allergen Reactions   Iodine Anaphylaxis   Shrimp [Shellfish Allergy]     Angioedema caused by shrimp as well as oysters. EpiPen current   Amlodipine Besylate     REACTION: edema, fatigue   Calcium Channel Blockers     REACTION: edema , fatigue   Cephalexin     REACTION: diarrhea   Clobetasol Other (See Comments)    Takes pigment out of skin   Codeine     REACTION: nausea   Coricidin D Cold-Flu-Sinus [Chlorphen-Pe-Acetaminophen] Hives    Crestor [Rosuvastatin]     Per patient gave her leg cramps    Erythromycin Diarrhea   Hydrochlorothiazide W-Triamterene     REACTION: low potassium   Morphine Nausea And Vomiting   Phenylephrine Hcl (Pressors) Hives   Statins     Leg cramps with Lipitor & Pravachol    History of Present Illness    Samantha Stark is a 67 y.o. female with a hx of palpitations, hypothyroidism, hypertension, hyperlipidemia    She has a history of palpitations with normal Myoview and event monitors in the past.  Symptoms previously related to travel and job stress.  She has been intolerant to most statins including Crestor, Lipitor, pravastatin.  Additionally intolerant to Zetia with myalgias.  She had ETT 05/07/2017 which was unremarkable.  She is now on Repatha for HLD   Notes palpitations which are usually triggered by fatigue, stress, caffeine. She has reduced caffeine to one cup of coffee per day. She usually enjoys yoga and is hoping to resume. Diagnosed with Hashimoto's. Discussed that this could contribute to palpitations.   Discussed using Armor Thyroid for replacement instead  She works remotely for [employer] and still stressful. Line dancing and has trip to [place] planned  Calcium score 175 , 84 th percentile for age/sex   EKGs/Labs/Other Studies Reviewed:  The following studies were reviewed today:  EKG:  EKG 10/30/21  demonstrates NSR 64 bpm with no acute ST/T wave changes.   Recent Labs: No results found for requested labs within last 365 days.  Recent Lipid Panel    Component Value Date/Time   CHOL 190 05/29/2021 0807   CHOL 285 (H) 05/15/2016 0826   CHOL 259 (H) 05/15/2015 0900   TRIG 51 05/29/2021 0807   TRIG 102 05/15/2016 0826   TRIG 119 05/15/2015 0900   HDL 61 05/29/2021 0807   HDL 55 05/15/2016 0826   HDL 52 05/15/2015 0900   CHOLHDL 3.1 05/29/2021 0807   CHOLHDL 4 11/26/2020 0806   VLDL 12.6 11/26/2020 0806   LDLCALC 119 (H) 05/29/2021 0807   LDLCALC 210 (H)  05/15/2016 0826   LDLCALC 183 (H) 05/15/2015 0900   LDLDIRECT 139.1 05/12/2013 1004   Home Medications   Current Meds  Medication Sig   aspirin EC 81 MG tablet Take 1 tablet (81 mg total) by mouth daily.   cyclobenzaprine (FLEXERIL) 5 MG tablet Take 1-2 tablets (5-10 mg total) by mouth 3 (three) times daily as needed for muscle spasms.   EPINEPHrine 0.3 mg/0.3 mL IJ SOAJ injection Inject 0.3 mLs (0.3 mg total) into the muscle as directed.   lisinopril (ZESTRIL) 20 MG tablet Take 1 tablet (20 mg total) by mouth daily.   metoprolol succinate (TOPROL-XL) 25 MG 24 hr tablet Take 1 tablet (25 mg total) by mouth 2 (two) times daily.    Review of Systems      All other systems reviewed and are otherwise negative except as noted above.  Physical Exam    VS:  BP 122/82   Pulse 75   Ht 5\' 1"  (1.549 m)   Wt 155 lb (70.3 kg)   SpO2 97%   BMI 29.29 kg/m  , BMI Body mass index is 29.29 kg/m.  Wt Readings from Last 3 Encounters:  05/19/23 155 lb (70.3 kg)  01/28/22 160 lb (72.6 kg)  10/30/21 156 lb (70.8 kg)     Affect appropriate Healthy:  appears stated age HEENT: normal Neck supple with no adenopathy JVP normal no bruits no thyromegaly Lungs clear with no wheezing and good diaphragmatic motion Heart:  S1/S2 no murmur, no rub, gallop or click PMI normal Abdomen: benighn, BS positve, no tenderness, no AAA no bruit.  No HSM or HJR Distal pulses intact with no bruits No edema Neuro non-focal Skin warm and dry No muscular weakness   Assessment & Plan    Palpitations - benign no evidence of structural HD ECG with NSR may be related to her thyroid dx no need for monitor    HTN - BP well controlled. Continue current antihypertensive regimen.    HLD - Statin (Pravastatin, Atorvastatin, Rosuvastatin) and Zetia intolerant. 05/2021 LDL 119. Continue Repatha. Calcium score thigh for age target LDL < 55  Thyroid:  On low dose synthroid TSH has been normal      Disposition: Follow  up in a year    Signed, Charlton Haws, MD 05/19/2023, 8:59 AM [facility]

## 2023-05-25 ENCOUNTER — Encounter: Payer: Self-pay | Admitting: Cardiovascular Disease

## 2023-05-26 ENCOUNTER — Encounter: Payer: Self-pay | Admitting: Gastroenterology

## 2023-05-27 ENCOUNTER — Ambulatory Visit: Payer: 59

## 2023-05-27 ENCOUNTER — Encounter: Payer: Self-pay | Admitting: Gastroenterology

## 2023-05-27 NOTE — Progress Notes (Deleted)
Patient ID: Samantha Stark                 DOB: January 18, 1956                    MRN: [id number]      HPI: Samantha Stark is a 67 y.o. female patient referred to lipid clinic by Dr.Nishan. PMH is significant for palpitations, hypothyroidism, hypertension, hyperlipidemia, angina.    Patient saw Dr.Nishan BP was controlled, patient reported she is on Repatha. However fill hx indicated patient got Repatha filled on 11/2022 for 3 months. Need to review adherence to the therapy.  Reviewed options for lowering LDL cholesterol, including ezetimibe, PCSK-9 inhibitors, bempedoic acid and inclisiran.  Discussed mechanisms of action, dosing, side effects and potential decreases in LDL cholesterol.  Also reviewed cost information and potential options for patient assistance.  Current Medications: Repatha - fill hx indicate - LF 3 months supply on Feb 2024  Intolerances: Crestor, Lipitor, pravastatin, Zetia - myalgias  Risk Factors: CAC score 175, CAD,hypertension, hyperlipidemia, angina LDL goal: <70 mg/dl  Last Lipid lab : TC 403, HDL 64 , LDLc 296, TG 63 (47/42/5956)  Diet:   Exercise:   Family History:   Social History:   Labs: Lipid Panel     Component Value Date/Time   CHOL 190 05/29/2021 0807   CHOL 285 (H) 05/15/2016 0826   CHOL 259 (H) 05/15/2015 0900   TRIG 51 05/29/2021 0807   TRIG 102 05/15/2016 0826   TRIG 119 05/15/2015 0900   HDL 61 05/29/2021 0807   HDL 55 05/15/2016 0826   HDL 52 05/15/2015 0900   CHOLHDL 3.1 05/29/2021 0807   CHOLHDL 4 11/26/2020 0806   VLDL 12.6 11/26/2020 0806   LDLCALC 119 (H) 05/29/2021 0807   LDLCALC 210 (H) 05/15/2016 0826   LDLCALC 183 (H) 05/15/2015 0900   LDLDIRECT 139.1 05/12/2013 1004   LABVLDL 10 05/29/2021 0807    Past Medical History:  Diagnosis Date   Allergy    Anxiety    Situational   Bug bites 04/25/2019   Cancer (HCC)    scalp   Chest pain 2006 & 2008   Negative nuclear stress study, Dr. Eden Emms   Counseling on health  promotion and disease prevention 06/27/2016   Diverticulosis    Encounter for counseling for care management of patient with chronic conditions and complex health needs using nurse-based model 06/27/2016   Hyperlipidemia    Hypertension    MVP (mitral valve prolapse)    Overweight (BMI 25.0-29.9) 06/05/2016   Screening for multiple conditions 08/28/2016   Seasonal allergies     Current Outpatient Medications on File Prior to Visit  Medication Sig Dispense Refill   aspirin EC 81 MG tablet Take 1 tablet (81 mg total) by mouth daily. 90 tablet 3   cyclobenzaprine (FLEXERIL) 5 MG tablet Take 1-2 tablets (5-10 mg total) by mouth 3 (three) times daily as needed for muscle spasms. 30 tablet 1   EPINEPHrine 0.3 mg/0.3 mL IJ SOAJ injection Inject 0.3 mLs (0.3 mg total) into the muscle as directed. 1 each 11   escitalopram (LEXAPRO) 10 MG tablet Take 1 tablet (10 mg total) by mouth daily. (Patient not taking: Reported on 05/19/2023) 90 tablet 1   Evolocumab (REPATHA SURECLICK) 140 MG/ML SOAJ Inject 1 pen into the skin every 14 (fourteen) days. (Patient not taking: Reported on 05/19/2023) 2 mL 11   lisinopril (ZESTRIL) 20 MG tablet Take 1 tablet (20 mg total)  by mouth daily. 90 tablet 1   metoprolol succinate (TOPROL-XL) 25 MG 24 hr tablet Take 1 tablet (25 mg total) by mouth 2 (two) times daily. 180 tablet 3   No current facility-administered medications on file prior to visit.    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 EpiPen current   Amlodipine Besylate     REACTION: edema, fatigue   Calcium Channel Blockers     REACTION: edema , fatigue   Cephalexin     REACTION: diarrhea   Clobetasol Other (See Comments)    Takes pigment out of skin   Codeine     REACTION: nausea   Coricidin D Cold-Flu-Sinus [Chlorphen-Pe-Acetaminophen] Hives   Crestor [Rosuvastatin]     Per patient gave her leg cramps     Erythromycin Diarrhea   Hydrochlorothiazide W-Triamterene     REACTION: low potassium   Morphine Nausea And Vomiting   Phenylephrine Hcl (Pressors) Hives   Statins     Leg cramps with Lipitor & Pravachol    Assessment/Plan:  1. Hyperlipidemia -  No problems updated. No problem-specific Assessment & Plan notes found for this encounter.    Thank you,  Carmela Hurt, Pharm.D [facility] A Division of [facility] [address], [place], [place] [address]  Phone: [phone]; Fax: [phone]

## 2023-06-01 ENCOUNTER — Telehealth: Payer: Self-pay | Admitting: Pharmacist

## 2023-06-01 ENCOUNTER — Ambulatory Visit: Payer: 59 | Attending: Internal Medicine | Admitting: Student

## 2023-06-01 ENCOUNTER — Encounter: Payer: Self-pay | Admitting: Student

## 2023-06-01 ENCOUNTER — Encounter: Payer: Self-pay | Admitting: Cardiovascular Disease

## 2023-06-01 VITALS — Ht 62.0 in | Wt 155.8 lb

## 2023-06-01 DIAGNOSIS — E782 Mixed hyperlipidemia: Secondary | ICD-10-CM

## 2023-06-01 NOTE — Patient Instructions (Addendum)
  Praluent is a cholesterol medication that improved your body's ability to get rid of "bad cholesterol" known as LDL. It can lower your LDL up to 60%. It is an injection that is given under the skin every 2 weeks. The most common side effects of Praluent include runny nose, symptoms of the common cold, rarely flu or flu-like symptoms, back/muscle pain in about 3-4% of the patients, and redness, pain, or bruising at the injection site.  The medication often requires a prior authorization from your insurance company. We will take care of submitting all the necessary information to your insurance company to get it approved.  Other medication we discussed today is trying atorvastatin 20 mg and adjusted to max tolerated dose.  We also discussed Leqvio- every 6 months injection, nexletol and Nexlizet

## 2023-06-01 NOTE — Telephone Encounter (Signed)
Hi Samantha Stark, would you like for [place] to initiate for 75 mg/mL or 150 mg/mL?

## 2023-06-01 NOTE — Progress Notes (Unsigned)
Patient ID: Samantha Stark                 DOB: [birth year]                    MRN: [id number]      HPI: Samantha Stark is a 67 y.o. female patient referred to lipid clinic by Dr.Nishan. PMH is significant for palpitations, hypothyroidism, hypertension, hyperlipidemia, angina.    Patient saw Dr.Nishan BP was controlled, patient reported she is on Repatha. However fill hx indicated patient got Repatha filled on 11/2022 for 3 months. Need to review adherence to the therapy. Patient presented today for lipid clinic reports first 2 months she did ok on Repatha the she started to have injection site reaction (Red raised and hot) she was following correct administration steps and changing injection site too. She tried it for 3 months reaction went worst each administration. Patient may have allergy to latex. She recalls trying rosuvastatin and pravastatin but does not recall trying atorvastatin. We discussed benefits of statins in detail and she is willing to try atorvastatin but after discussing with her PCP. We reviewed Praluent, bempedoic acid and inclisiran.  Discussed mechanisms of action, dosing, side effects and potential decreases in LDL cholesterol.  Also reviewed cost information and potential options for patient assistance.  Current Medications: Repatha - fill hx indicate - LF 3 months supply on Feb 2024  Intolerances: Crestor, Lipitor, pravastatin, Zetia - myalgias  Risk Factors: CAC score 175, CAD,hypertension, hyperlipidemia, angina LDL goal: <70 mg/dl  Last Lipid lab : TC 409, HDL 64 , LDLc 296, TG 63 (81/19/1478) (04/23/2023)TC: 304 HDL 61, TG 67, VLDLc 10 LDLc 233 (per patient )      Diet: low carb diet  Eat out - once week   Exercise: walking 20 min every day. 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   Family History:  Relation Problem Comments  Mother    Colon polyps   Goiter also Guillain Barre  Hyperlipidemia      Father Alcohol abuse     Sister Hyperlipidemia     Brother Colon polyps   Hyperlipidemia X2    Brother Hyperlipidemia     Maternal Aunt Colon polyps   Diabetes X2    Maternal Aunt Alzheimer's disease     Maternal Uncle (Deceased) Colon cancer (Age: 36)   Heart attack MI in 4s    Paternal Aunt Alcohol abuse   Hyperlipidemia     Paternal Uncle Alcohol abuse     Maternal Grandmother Diabetes   Osteoporosis     Maternal Grandfather Stroke in 34s    Paternal Grandmother Diabetes       Social History:  Alcohol: 1-2 drinks per week  Smoking: never  Labs: Lipid Panel     Component Value Date/Time   CHOL 190 05/29/2021 0807   CHOL 285 (H) 05/15/2016 0826   CHOL 259 (H) 05/15/2015 0900   TRIG 51 05/29/2021 0807   TRIG 102 05/15/2016 0826   TRIG 119 05/15/2015 0900   HDL 61 05/29/2021 0807   HDL 55 05/15/2016 0826   HDL 52 05/15/2015 0900   CHOLHDL 3.1 05/29/2021 0807   CHOLHDL 4 11/26/2020 0806   VLDL 12.6 11/26/2020 0806   LDLCALC 119 (H) 05/29/2021 0807   LDLCALC 210 (H) 05/15/2016 0826   LDLCALC 183 (H) 05/15/2015 0900   LDLDIRECT 139.1 05/12/2013 1004   LABVLDL 10 05/29/2021 2956  Past Medical History:  Diagnosis Date   Allergy    Anxiety    Situational   Bug bites 04/25/2019   Cancer [facility])    scalp   Chest pain 2006 & 2008   Negative nuclear stress study, Dr. Eden Emms   Counseling on health promotion and disease prevention 06/27/2016   Diverticulosis    Encounter for counseling for care management of patient with chronic conditions and complex health needs using nurse-based model 06/27/2016   Hyperlipidemia    Hypertension    MVP (mitral valve prolapse)    Overweight (BMI 25.0-29.9) 06/05/2016   Screening for multiple conditions 08/28/2016   Seasonal allergies     Current Outpatient Medications on File Prior to Visit  Medication Sig Dispense Refill   aspirin EC 81 MG tablet Take 1 tablet (81 mg total) by mouth daily. 90 tablet 3    cyclobenzaprine (FLEXERIL) 5 MG tablet Take 1-2 tablets (5-10 mg total) by mouth 3 (three) times daily as needed for muscle spasms. 30 tablet 1   EPINEPHrine 0.3 mg/0.3 mL IJ SOAJ injection Inject 0.3 mLs (0.3 mg total) into the muscle as directed. 1 each 11   escitalopram (LEXAPRO) 10 MG tablet Take 1 tablet (10 mg total) by mouth daily. (Patient not taking: Reported on 05/19/2023) 90 tablet 1   Evolocumab (REPATHA SURECLICK) 140 MG/ML SOAJ Inject 1 pen into the skin every 14 (fourteen) days. (Patient not taking: Reported on 05/19/2023) 2 mL 11   lisinopril (ZESTRIL) 20 MG tablet Take 1 tablet (20 mg total) by mouth daily. 90 tablet 1   metoprolol succinate (TOPROL-XL) 25 MG 24 hr tablet Take 1 tablet (25 mg total) by mouth 2 (two) times daily. 180 tablet 3   No current facility-administered medications on file prior to visit.    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 EpiPen current   Amlodipine Besylate     REACTION: edema, fatigue   Calcium Channel Blockers     REACTION: edema , fatigue   Cephalexin     REACTION: diarrhea   Clobetasol Other (See Comments)    Takes pigment out of skin   Codeine     REACTION: nausea   Coricidin D Cold-Flu-Sinus [Chlorphen-Pe-Acetaminophen] Hives   Crestor [Rosuvastatin]     Per patient gave her leg cramps    Erythromycin Diarrhea   Hydrochlorothiazide W-Triamterene     REACTION: low potassium   Morphine Nausea And Vomiting   Phenylephrine Hcl (Pressors) Hives   Statins     Leg cramps with Lipitor & Pravachol    Assessment/Plan:  1. Hyperlipidemia -  Problem  HYPERLIPIDEMIA- mgt per Cards     Current Medications: Repatha - fill hx indicate - LF 3 months supply on Feb 2024  Intolerances: Crestor, Lipitor, pravastatin, Zetia - myalgias Repatha - injection site reaction  Risk Factors: CAC score 175, CAD,hypertension, hyperlipidemia, angina LDL goal: <70  mg/dl  Last Lipid lab : TC 161, HDL 64 , LDLc 296, TG 63 (09/60/4540) (04/23/2023)TC: 304 HDL 61, TG 67, VLDLc 10 LDLc 233 (per patient )       HYPERLIPIDEMIA- mgt per Cards Assessment:  LDL goal: <70 mg/dl last LDLc 981 mg/dl 19/1478) Intolerance to Crestor, Lipitor and pravastatin - myalgia and muscle weakness Repatha - injection site reaction  Discussed next potential options (Praluent, bempedoic acid and inclisiran); cost, dosing efficacy, side effects  Patient is open to  try Lipitor as does not recall trying that one however would like to discuss it with PCP Follows low fat diet, can improve on exercise   Plan: Will apply for PA for Praluent; will inform patient upon approval Can consider adding low dose atorvastatin once Praluent tolerated well  Lipid lab due in 2-3 months after starting PCSK9i    Thank you,  Carmela Hurt, Pharm.D [facility] A Division of [facility] [address] [place], [place] [address]  Phone: [phone]; Fax: [phone]

## 2023-06-01 NOTE — Assessment & Plan Note (Signed)
Assessment:  LDL goal: <70 mg/dl last LDLc 098 mg/dl 08/9146) Intolerance to Crestor, Lipitor and pravastatin - myalgia and muscle weakness Repatha - injection site reaction  Discussed next potential options (Praluent, bempedoic acid and inclisiran); cost, dosing efficacy, side effects  Patient is open to try Lipitor as does not recall trying that one however would like to discuss it with PCP Follows low fat diet, can improve on exercise   Plan: Will apply for PA for Praluent; will inform patient upon approval Can consider adding low dose atorvastatin once Praluent tolerated well  Lipid lab due in 2-3 months after starting [facility]

## 2023-06-02 ENCOUNTER — Other Ambulatory Visit (HOSPITAL_COMMUNITY): Payer: Self-pay

## 2023-06-02 ENCOUNTER — Telehealth: Payer: Self-pay

## 2023-06-02 NOTE — Telephone Encounter (Signed)
PA request has been Submitted. New Encounter created for follow up. For additional info see Pharmacy Prior Auth telephone encounter from 06/02/23.

## 2023-06-02 NOTE — Telephone Encounter (Signed)
Pharmacy Patient Advocate Encounter   Received notification from Physician's Office that prior authorization for PRALUENT is required/requested.   Insurance verification completed.   The patient is insured through [facility] .   Per test claim: PA required; PA submitted to [facility] via CoverMyMeds Key/confirmation #/EOC [id number] Status is pending

## 2023-06-04 NOTE — Telephone Encounter (Signed)
Called patient to discuss clinical trials. Patient states she does not understand any of the statistics. Would like to have link so that she could share with her PCP and make decision.

## 2023-06-04 NOTE — Telephone Encounter (Signed)
Pharmacy Patient Advocate Encounter  Received notification from [facility] that Prior Authorization for PRALUENT has been DENIED. Please advise how you'd like to proceed. Full denial letter will be uploaded to the media tab. See denial reason below.  PLAN EXCLUSION  *REACTION TO REPATHA NOTED ON REQUEST SENT TO PLAN, WILL REQUIRE APPEAL LETTER

## 2023-06-08 ENCOUNTER — Encounter: Payer: Self-pay | Admitting: Gastroenterology

## 2023-06-08 ENCOUNTER — Ambulatory Visit (AMBULATORY_SURGERY_CENTER): Payer: 59

## 2023-06-08 VITALS — Ht 62.0 in | Wt 155.0 lb

## 2023-06-08 DIAGNOSIS — Z86010 Personal history of colonic polyps: Secondary | ICD-10-CM

## 2023-06-08 MED ORDER — NA SULFATE-K SULFATE-MG SULF 17.5-3.13-1.6 GM/177ML PO SOLN
1.0000 | Freq: Once | ORAL | 0 refills | Status: AC
Start: 2023-06-08 — End: 2023-06-08

## 2023-06-08 NOTE — Progress Notes (Signed)
No egg or soy allergy known to patient  No issues known to pt with past sedation with any surgeries or procedures Patient denies ever being told they had issues or difficulty with intubation  No FH of Malignant Hyperthermia Pt is not on diet pills Pt is not on  home 02  Pt is not on blood thinners  Pt denies issues with constipation  No A fib or A flutter: pt reports mitral valve prolapse that results in occ flutter EKG with Dr. Eden Emms was normal no chest pain  Have any cardiac testing pending--no  LOA: independent  Prep: suprep  Patient's chart reviewed by Cathlyn Parsons CNRA prior to previsit and patient appropriate for the [facility].  Previsit completed and red dot placed by patient's name on their procedure day (on provider's schedule).     PV competed with patient. Prep instructions sent via mychart

## 2023-06-09 NOTE — Telephone Encounter (Signed)
Appeal submitted on 06/09/2023.

## 2023-06-16 ENCOUNTER — Encounter: Payer: Self-pay | Admitting: Gastroenterology

## 2023-06-16 ENCOUNTER — Ambulatory Visit (AMBULATORY_SURGERY_CENTER): Payer: 59 | Admitting: Gastroenterology

## 2023-06-16 VITALS — BP 111/66 | HR 64 | Temp 95.7°F | Resp 14 | Ht 62.0 in | Wt 155.0 lb

## 2023-06-16 DIAGNOSIS — D122 Benign neoplasm of ascending colon: Secondary | ICD-10-CM | POA: Diagnosis not present

## 2023-06-16 DIAGNOSIS — K635 Polyp of colon: Secondary | ICD-10-CM

## 2023-06-16 DIAGNOSIS — Z86010 Personal history of colonic polyps: Secondary | ICD-10-CM

## 2023-06-16 DIAGNOSIS — Z09 Encounter for follow-up examination after completed treatment for conditions other than malignant neoplasm: Secondary | ICD-10-CM

## 2023-06-16 MED ORDER — SODIUM CHLORIDE 0.9 % IV SOLN: 500.0000 mL | Freq: Once | INTRAVENOUS | Status: DC

## 2023-06-16 NOTE — Progress Notes (Signed)
Uneventful anesthetic. Report to pacu rn. Vss. Care resumed by rn. 

## 2023-06-16 NOTE — Op Note (Signed)
[facility] Patient Name: Samantha Stark Procedure Date: 06/16/2023 9:39 AM MRN: [id number] Endoscopist: Napoleon Form , MD, [id number] Age: 67 Referring MD:  Date of Birth: 1956-06-12 Gender: Female Account #: [id number] Procedure:                Colonoscopy Indications:              High risk colon cancer surveillance: Personal                            history of colonic polyps, High risk colon cancer                            surveillance: Personal history of adenoma (10 mm or                            greater in size), High risk colon cancer                            surveillance: Personal history of multiple (3 or                            more) adenomas, High risk colon cancer                            surveillance: Personal history of adenoma less than                            10 mm in size, High risk colon cancer surveillance:                            Personal history of sessile serrated colon polyp                            (10 mm or greater in size) Medicines:                Monitored Anesthesia Care Procedure:                Pre-Anesthesia Assessment:                           - Prior to the procedure, a History and Physical                            was performed, and patient medications and                            allergies were reviewed. The patient's tolerance of                            previous anesthesia was also reviewed. The risks                            and benefits of the procedure and the sedation  options and risks were discussed with the patient.                            All questions were answered, and informed consent                            was obtained. Prior Anticoagulants: The patient has                            taken no anticoagulant or antiplatelet agents. ASA                            Grade Assessment: II - A patient with mild systemic                            disease. After  reviewing the risks and benefits,                            the patient was deemed in satisfactory condition to                            undergo the procedure.                           After obtaining informed consent, the colonoscope                            was passed under direct vision. Throughout the                            procedure, the patient's blood pressure, pulse, and                            oxygen saturations were monitored continuously. The                            Olympus PCF-H190DL (#[id number]) Colonoscope was                            introduced through the anus and advanced to the the                            cecum, identified by appendiceal orifice and                            ileocecal valve. The colonoscopy was performed                            without difficulty. The patient tolerated the                            procedure well. The quality of the bowel  preparation was adequate to identify polyps greater                            than 5 mm in size. The ileocecal valve, appendiceal                            orifice, and rectum were photographed. Scope In: 10:00:01 AM Scope Out: 10:24:55 AM Scope Withdrawal Time: 0 hours 16 minutes 59 seconds  Total Procedure Duration: 0 hours 24 minutes 54 seconds  Findings:                 The perianal and digital rectal examinations were                            normal.                           Two mucous-capped polyps were found in the                            ascending colon. The polyps were 15 to 18 mm in                            size. These polyps were removed with a piecemeal                            technique using a cold snare. Resection and                            retrieval were complete.                           Scattered large-mouthed, medium-mouthed and                            small-mouthed diverticula were found in the sigmoid                             colon, descending colon, transverse colon and                            ascending colon. There was narrowing of the colon                            in association with the diverticular opening. There                            was evidence of diverticular spasm.                            Peri-diverticular erythema was seen. There was                            evidence of an impacted diverticulum.  Non-bleeding external and internal hemorrhoids were                            found during retroflexion. The hemorrhoids were                            small. Complications:            No immediate complications. Estimated Blood Loss:     Estimated blood loss was minimal. Impression:               - Two 15 to 18 mm polyps in the ascending colon,                            removed piecemeal using a cold snare. Resected and                            retrieved.                           - Severe diverticulosis in the sigmoid colon, in                            the descending colon, in the transverse colon and                            in the ascending colon. There was narrowing of the                            colon in association with the diverticular opening.                            There was evidence of diverticular spasm.                            Peri-diverticular erythema was seen. There was                            evidence of an impacted diverticulum.                           - Non-bleeding external and internal hemorrhoids. Recommendation:           - Resume previous diet.                           - Continue present medications.                           - Await pathology results.                           - Repeat colonoscopy in 1 year for surveillance                            after piecemeal polypectomy. Napoleon Form, MD 06/16/2023 10:34:39 AM This report  has been signed electronically.

## 2023-06-16 NOTE — Progress Notes (Signed)
Called to room to assist during endoscopic procedure.  Patient ID and intended procedure confirmed with present staff. Received instructions for my participation in the procedure from the performing physician.  

## 2023-06-16 NOTE — Progress Notes (Signed)
[facility] History and Physical   Primary Care Physician:  Lavada Mesi, MD   Reason for Procedure:  History of adenomatous colon polyps  Plan:    Surveillance colonoscopy with possible interventions as needed     HPI: Samantha Stark is a very pleasant 67 y.o. female here for surveillance colonoscopy. Denies any nausea, vomiting, abdominal pain, melena or bright red blood per rectum  The risks and benefits as well as alternatives of endoscopic procedure(s) have been discussed and reviewed. All questions answered. The patient agrees to proceed.    Past Medical History:  Diagnosis Date   Allergy    Anxiety    Situational   Bug bites 04/25/2019   Cancer [facility])    scalp   Chest pain 2006 & 2008   Negative nuclear stress study, Dr. Eden Emms   Counseling on health promotion and disease prevention 06/27/2016   Diverticulosis    Encounter for counseling for care management of patient with chronic conditions and complex health needs using nurse-based model 06/27/2016   Hyperlipidemia    Hypertension    MVP (mitral valve prolapse)    Overweight (BMI 25.0-29.9) 06/05/2016   Screening for multiple conditions 08/28/2016   Seasonal allergies     Past Surgical History:  Procedure Laterality Date   ABDOMINAL HYSTERECTOMY     COLONOSCOPY  2008 & 2011   [facility], negative except for Tics   KNEE ARTHROSCOPY Bilateral 2002, 2007   PARTIAL HYSTERECTOMY  1993   ROTATOR CUFF REPAIR Right 2010   SPINE SURGERY  1992   Lumbar laminectomy L5-S1   TONSILLECTOMY  1976   TUBAL LIGATION      Prior to Admission medications   Medication Sig Start Date End Date Taking? Authorizing Provider  escitalopram (LEXAPRO) 10 MG tablet Take 1 tablet (10 mg total) by mouth daily. Patient taking differently: Take 5 mg by mouth daily. 11/07/20  Yes Nche, Bonna Gains, NP  lisinopril (ZESTRIL) 20 MG tablet Take 1 tablet (20 mg total) by mouth daily. 11/07/20  Yes Nche, Bonna Gains, NP  NP  THYROID 15 MG tablet Take 15 mg by mouth daily. 05/19/23  Yes [provider]  aspirin EC 81 MG tablet Take 1 tablet (81 mg total) by mouth daily. 04/09/17   Wendall Stade, MD  EPINEPHrine 0.3 mg/0.3 mL IJ SOAJ injection Inject 0.3 mLs (0.3 mg total) into the muscle as directed. 04/25/19   Dianne Dun, MD  metoprolol succinate (TOPROL-XL) 25 MG 24 hr tablet Take 1 tablet (25 mg total) by mouth 2 (two) times daily. 10/30/21   Alver Sorrow, NP    Current Outpatient Medications  Medication Sig Dispense Refill   escitalopram (LEXAPRO) 10 MG tablet Take 1 tablet (10 mg total) by mouth daily. (Patient taking differently: Take 5 mg by mouth daily.) 90 tablet 1   lisinopril (ZESTRIL) 20 MG tablet Take 1 tablet (20 mg total) by mouth daily. 90 tablet 1   NP THYROID 15 MG tablet Take 15 mg by mouth daily.     aspirin EC 81 MG tablet Take 1 tablet (81 mg total) by mouth daily. 90 tablet 3   EPINEPHrine 0.3 mg/0.3 mL IJ SOAJ injection Inject 0.3 mLs (0.3 mg total) into the muscle as directed. 1 each 11   metoprolol succinate (TOPROL-XL) 25 MG 24 hr tablet Take 1 tablet (25 mg total) by mouth 2 (two) times daily. 180 tablet 3   Current Facility-Administered Medications  Medication Dose Route Frequency Provider  Last Rate Last Admin   0.9 %  sodium chloride infusion  500 mL Intravenous Once Napoleon Form, MD        Allergies as of 06/16/2023 - Review Complete 06/16/2023  Allergen Reaction Noted   Iodine Anaphylaxis 01/26/2017   Shrimp [shellfish allergy]  03/15/2012   Amlodipine besylate     Calcium channel blockers     Cephalexin     Clobetasol Other (See Comments) 04/25/2019   Codeine  05/04/2007   Coricidin d cold-flu-sinus [chlorphen-pe-acetaminophen] Hives 03/04/2016   Crestor [rosuvastatin]  09/17/2017   Erythromycin Diarrhea 06/05/2016   Hydrochlorothiazide  06/08/2023   Hydrochlorothiazide w-triamterene     Morphine Nausea And Vomiting 02/17/2017   Phenylephrine hcl  (pressors) Hives 01/26/2017   Statins  05/12/2013    Family History  Problem Relation Age of Onset   Colon polyps Mother    Hyperlipidemia Mother    Goiter Mother        also Paediatric nurse   Asthma Mother    Alcohol abuse Father    Hyperlipidemia Sister    Colon polyps Brother    Hyperlipidemia Brother        X2   Hyperlipidemia Brother    Colon polyps Maternal Aunt    Diabetes Maternal Aunt        X2   Alzheimer's disease Maternal Aunt    Heart attack Maternal Uncle        MI in 50s   Colon cancer Maternal Uncle 60   Alcohol abuse Paternal Aunt    Hyperlipidemia Paternal Aunt    Alcohol abuse Paternal Uncle    Osteoporosis Maternal Grandmother    Diabetes Maternal Grandmother    Stroke Maternal Grandfather        in 43s   Diabetes Paternal Grandmother    Rectal cancer Neg Hx    Stomach cancer Neg Hx     Social History   Socioeconomic History   Marital status: Married    Spouse name: Not on file   Number of children: Not on file   Years of education: Not on file   Highest education level: Not on file  Occupational History   Occupation: Programmer, applications: [employer]  Tobacco Use   Smoking status: Never   Smokeless tobacco: Never  Vaping Use   Vaping status: Never Used  Substance and Sexual Activity   Alcohol use: Yes    Alcohol/week: 3.0 standard drinks of alcohol    Types: 3 Glasses of wine per week   Drug use: No   Sexual activity: Yes    Partners: Male    Birth control/protection: Surgical  Other Topics Concern   Not on file  Social History Narrative   Married.   2 children, 6 grandchildren.   Regular exercise: yes   Caffeine use: daily   Enjoys reading, gardening, sports, teaching yoga.   Social Determinants of Health   Financial Resource Strain: Not on file  Food Insecurity: Not on file  Transportation Needs: Not on file  Physical Activity: Not on file  Stress: Not on file  Social Connections: Not on file  Intimate Partner  Violence: Not on file    Review of Systems:  All other review of systems negative except as mentioned in the HPI.  Physical Exam: Vital signs in last 24 hours: BP (!) 144/79   Pulse 63   Temp (!) 95.7 F (35.4 C)   Ht 5\' 2"  (1.575 m)   Wt 155  lb (70.3 kg)   SpO2 97%   BMI 28.35 kg/m  General:   Alert, NAD Lungs:  Clear .   Heart:  Regular rate and rhythm Abdomen:  Soft, nontender and nondistended. Neuro/Psych:  Alert and cooperative. Normal mood and affect. A and O x 3  Reviewed labs, radiology imaging, old records and pertinent past GI work up  Patient is appropriate for planned procedure(s) and anesthesia in an ambulatory setting   K. Scherry Ran , MD [phone]

## 2023-06-16 NOTE — Patient Instructions (Signed)
YOU HAD AN ENDOSCOPIC PROCEDURE TODAY AT THE [facility]:   Refer to the procedure report that was given to you for any specific questions about what was found during the examination.  If the procedure report does not answer your questions, please call your gastroenterologist to clarify.  If you requested that your care partner not be given the details of your procedure findings, then the procedure report has been included in a sealed envelope for you to review at your convenience later.  **Handouts given on polyps, hemorrhoids and diverticulosis**  YOU SHOULD EXPECT: Some feelings of bloating in the abdomen. Passage of more gas than usual.  Walking can help get rid of the air that was put into your GI tract during the procedure and reduce the bloating. If you had a lower endoscopy (such as a colonoscopy or flexible sigmoidoscopy) you may notice spotting of blood in your stool or on the toilet paper. If you underwent a bowel prep for your procedure, you may not have a normal bowel movement for a few days.  Please Note:  You might notice some irritation and congestion in your nose or some drainage.  This is from the oxygen used during your procedure.  There is no need for concern and it should clear up in a day or so.  SYMPTOMS TO REPORT IMMEDIATELY:  Following lower endoscopy (colonoscopy or flexible sigmoidoscopy):  Excessive amounts of blood in the stool  Significant tenderness or worsening of abdominal pains  Swelling of the abdomen that is new, acute  Fever of 100F or higher  For urgent or emergent issues, a gastroenterologist can be reached at any hour by calling [phone]. Do not use MyChart messaging for urgent concerns.    DIET:  We do recommend a small meal at first, but then you may proceed to your regular diet.  Drink plenty of fluids but you should avoid alcoholic beverages for 24 hours.  ACTIVITY:  You should plan to take it easy for the rest of today and you  should NOT DRIVE or use heavy machinery until tomorrow (because of the sedation medicines used during the test).    FOLLOW UP: Our staff will call the number listed on your records the next business day following your procedure.  We will call around 7:15- 8:00 am to check on you and address any questions or concerns that you may have regarding the information given to you following your procedure. If we do not reach you, we will leave a message.     If any biopsies were taken you will be contacted by phone or by letter within the next 1-3 weeks.  Please call us at [phone] if you have not heard about the biopsies in 3 weeks.    SIGNATURES/CONFIDENTIALITY: You and/or your care partner have signed paperwork which will be entered into your electronic medical record.  These signatures attest to the fact that that the information above on your After Visit Summary has been reviewed and is understood.  Full responsibility of the confidentiality of this discharge information lies with you and/or your care-partner. 

## 2023-06-17 ENCOUNTER — Telehealth: Payer: Self-pay | Admitting: *Deleted

## 2023-06-17 NOTE — Telephone Encounter (Signed)
  Follow up Call-     06/16/2023    8:54 AM  Call back number  Post procedure Call Back phone  # [phone]  Permission to leave phone message Yes     Patient questions:  Do you have a fever, pain , or abdominal swelling? No. Pain Score  0 *  Have you tolerated food without any problems? Yes.    Have you been able to return to your normal activities? Yes.    Do you have any questions about your discharge instructions: Diet   No. Medications  No. Follow up visit  No.  Do you have questions or concerns about your Care? No.  Actions: * If pain score is 4 or above: No action needed, pain <4.

## 2023-06-18 ENCOUNTER — Other Ambulatory Visit (HOSPITAL_COMMUNITY): Payer: Self-pay

## 2023-06-18 NOTE — Telephone Encounter (Signed)
Pharmacy Patient Advocate Encounter  Received notification from [facility] that Prior Authorization for PRALUENT has been APPROVED from 06/11/23 to 06/10/24. Ran test claim, Copay is $120 (PT HAS A DEDUCTIBLE). This test claim was processed through [facility]- copay amounts may vary at other pharmacies due to pharmacy/plan contracts, or as the patient moves through the different stages of their insurance plan.

## 2023-06-18 NOTE — Telephone Encounter (Signed)
Spoke to patient about PA approval. Patient would like to speak to PCP on Monday Sept 09  and will call us back.

## 2023-06-21 ENCOUNTER — Encounter: Payer: 59 | Admitting: Gastroenterology

## 2023-06-22 ENCOUNTER — Encounter: Payer: Self-pay | Admitting: Gastroenterology

## 2023-06-24 NOTE — Telephone Encounter (Signed)
Call to see patient is ready to initiate Praluent. N/A LVM.

## 2023-06-29 ENCOUNTER — Encounter: Payer: Self-pay | Admitting: Gastroenterology

## 2023-07-06 ENCOUNTER — Encounter: Payer: Self-pay | Admitting: Gastroenterology

## 2023-08-18 ENCOUNTER — Other Ambulatory Visit: Payer: Self-pay | Admitting: Family Medicine

## 2023-08-18 ENCOUNTER — Ambulatory Visit
Admission: RE | Admit: 2023-08-18 | Discharge: 2023-08-18 | Disposition: A | Discharge status: Home / Self Care | Payer: 59 | Source: Ambulatory Visit | Attending: Family Medicine | Admitting: Family Medicine

## 2023-08-18 DIAGNOSIS — M25411 Effusion, right shoulder: Secondary | ICD-10-CM

## 2023-08-18 DIAGNOSIS — R52 Pain, unspecified: Secondary | ICD-10-CM

## 2023-08-18 DIAGNOSIS — R609 Edema, unspecified: Secondary | ICD-10-CM

## 2023-08-19 ENCOUNTER — Encounter: Payer: Self-pay | Admitting: Family Medicine

## 2023-08-24 ENCOUNTER — Ambulatory Visit
Admission: RE | Admit: 2023-08-24 | Discharge: 2023-08-24 | Disposition: A | Discharge status: Home / Self Care | Payer: 59 | Source: Ambulatory Visit | Attending: Family Medicine | Admitting: Family Medicine

## 2023-08-24 DIAGNOSIS — M25411 Effusion, right shoulder: Secondary | ICD-10-CM

## 2023-10-08 ENCOUNTER — Other Ambulatory Visit: Payer: Self-pay | Admitting: Family Medicine

## 2023-10-08 DIAGNOSIS — Z1231 Encounter for screening mammogram for malignant neoplasm of breast: Secondary | ICD-10-CM

## 2023-10-20 ENCOUNTER — Inpatient Hospital Stay: Admission: RE | Admit: 2023-10-20 | Payer: 59 | Source: Ambulatory Visit

## 2024-02-16 ENCOUNTER — Other Ambulatory Visit: Payer: Self-pay | Admitting: Family Medicine

## 2024-02-16 DIAGNOSIS — Z780 Asymptomatic menopausal state: Secondary | ICD-10-CM

## 2024-02-22 ENCOUNTER — Ambulatory Visit
Admission: RE | Admit: 2024-02-22 | Discharge: 2024-02-22 | Disposition: A | Discharge status: Home / Self Care | Source: Ambulatory Visit | Attending: Family Medicine | Admitting: Family Medicine

## 2024-02-22 DIAGNOSIS — Z13820 Encounter for screening for osteoporosis: Secondary | ICD-10-CM | POA: Insufficient documentation

## 2024-02-22 DIAGNOSIS — Z780 Asymptomatic menopausal state: Secondary | ICD-10-CM | POA: Diagnosis present

## 2024-02-22 DIAGNOSIS — Z1231 Encounter for screening mammogram for malignant neoplasm of breast: Secondary | ICD-10-CM | POA: Insufficient documentation

## 2024-02-22 DIAGNOSIS — M8589 Other specified disorders of bone density and structure, multiple sites: Secondary | ICD-10-CM | POA: Insufficient documentation

## 2024-09-15 ENCOUNTER — Other Ambulatory Visit: Payer: Self-pay | Admitting: Family Medicine

## 2024-09-15 DIAGNOSIS — E042 Nontoxic multinodular goiter: Secondary | ICD-10-CM

## 2024-09-15 DIAGNOSIS — E063 Autoimmune thyroiditis: Secondary | ICD-10-CM

## 2024-09-25 ENCOUNTER — Inpatient Hospital Stay: Admission: RE | Admit: 2024-09-25 | Discharge: 2024-09-25 | Attending: Family Medicine | Admitting: Family Medicine

## 2024-09-25 DIAGNOSIS — E063 Autoimmune thyroiditis: Secondary | ICD-10-CM

## 2024-09-25 DIAGNOSIS — E042 Nontoxic multinodular goiter: Secondary | ICD-10-CM

## 2024-10-02 ENCOUNTER — Telehealth: Payer: Self-pay | Admitting: Cardiovascular Disease

## 2024-10-02 NOTE — Telephone Encounter (Signed)
 Left message to call office.

## 2024-10-02 NOTE — Telephone Encounter (Signed)
" °  Per MyChart scheduling message:  Pt c/o Shortness Of Breath: STAT if SOB developed within the last 24 hours or pt is noticeably SOB on the phone  1. Are you currently SOB (can you hear that pt is SOB on the phone)?   2. How long have you been experiencing SOB?   3. Are you SOB when sitting or when up moving around?   4. Are you currently experiencing any other symptoms?    SOB only when taking stairs or walking quickly. Have never experienced this before. No other symptoms.     "

## 2024-10-04 NOTE — Telephone Encounter (Signed)
 Patient called back. Patient stated she has had SOB with activity for the last 3 months following a respiratory virus. Made patient an appointment next week with Dr. Delford. She is over due for her yearly follow-up, but she is also having surgery on her ankle the first week of January. She wanted to make sure she was cleared for surgery. Patient agreed to plan to get evaluated for SOB with activity next week at her appointment.

## 2024-10-04 NOTE — Progress Notes (Signed)
 "  Office Visit    Patient Name: Samantha Stark Date of Encounter: 10/10/2024  PCP:  Hughie Sharper, MD   [facility]  Cardiologist:  Maude Emmer, MD  Advanced Practice Provider:  No care team member to display Electrophysiologist:  None   Chief Complaint    Samantha Stark is a 68 y.o. female with a hx of palpitations, hypothyroidism, hypertension, hyperlipidemia  presents today for palpitations,dyspnea and preoperative clearance   Past Medical History    Past Medical History:  Diagnosis Date   Allergy    Anxiety    Situational   Bug bites 04/25/2019   Cancer (HCC)    scalp   Chest pain 2006 & 2008   Negative nuclear stress study, Dr. Emmer   Counseling on health promotion and disease prevention 06/27/2016   Diverticulosis    Encounter for counseling for care management of patient with chronic conditions and complex health needs using nurse-based model 06/27/2016   Hyperlipidemia    Hypertension    MVP (mitral valve prolapse)    Overweight (BMI 25.0-29.9) 06/05/2016   Screening for multiple conditions 08/28/2016   Seasonal allergies    Past Surgical History:  Procedure Laterality Date   ABDOMINAL HYSTERECTOMY     COLONOSCOPY  2008 & 2011   [facility], negative except for Tics   KNEE ARTHROSCOPY Bilateral 2002, 2007   PARTIAL HYSTERECTOMY  1993   ROTATOR CUFF REPAIR Right 2010   SPINE SURGERY  1992   Lumbar laminectomy L5-S1   TONSILLECTOMY  1976   TUBAL LIGATION      Allergies  Allergies  Allergen Reactions   Iodine Anaphylaxis   Shrimp [Shellfish Allergy]     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 EpiPen  current   Amlodipine Besylate     REACTION: edema, fatigue   Calcium  Channel Blockers     REACTION: edema , fatigue   Cephalexin     REACTION: diarrhea   Clobetasol  Other (See Comments)    Takes pigment out of skin   Codeine     REACTION: nausea   Coricidin D Cold-Flu-Sinus [Chlorphen-Pe-Acetaminophen ] Hives    Crestor  [Rosuvastatin ]     Per patient gave her leg cramps    Erythromycin Diarrhea   Hydrochlorothiazide     Other Reaction(s): Not available   Hydrochlorothiazide-Triamterene     REACTION: low potassium   Morphine Nausea And Vomiting   Phenylephrine Hcl (Pressors) Hives   Statins     Leg cramps with Lipitor & Pravachol    History of Present Illness    Samantha Stark is a 68 y.o. female with a hx of palpitations, hypothyroidism, hypertension, hyperlipidemia    She has a history of palpitations with normal Myoview and event monitors in the past.  Symptoms previously related to travel and job stress.  She has been intolerant to most statins including Crestor , Lipitor, pravastatin.  Additionally intolerant to Zetia  with myalgias.  She had ETT 05/07/2017 which was unremarkable.  She is now on Repatha  for HLD   Notes palpitations which are usually triggered by fatigue, stress, caffeine. She has reduced caffeine to one cup of coffee per day. She usually enjoys yoga and is hoping to resume. Diagnosed with Hashimoto's. Discussed that this could contribute to palpitations.   Discussed using Armor Thyroid  for replacement instead  She works remotely for [employer] and still stressful. 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 She also needs ankle surgery first week of January I believe this is with Dr Kit for lipoma on right ankle Echo done today 10/10/24 reviewed by myself showed EF 65-70% with normal RV And trivial AR/MR   EKGs/Labs/Other Studies Reviewed:   The following studies were reviewed today:  EKG:  EKG 10/30/21  demonstrates NSR 64 bpm with no acute ST/T wave changes.   Recent Labs: No results found for requested labs within last 365 days.  Recent Lipid Panel     Component Value Date/Time   CHOL 190 05/29/2021 0807   CHOL 285 (H) 05/15/2016 0826   CHOL 259 (H) 05/15/2015 0900   TRIG 51 05/29/2021 0807   TRIG 102 05/15/2016 0826   TRIG 119 05/15/2015 0900   HDL 61 05/29/2021 0807   HDL 55 05/15/2016 0826   HDL 52 05/15/2015 0900   CHOLHDL 3.1 05/29/2021 0807   CHOLHDL 4 11/26/2020 0806   VLDL 12.6 11/26/2020 0806   LDLCALC 119 (H) 05/29/2021 0807   LDLCALC 210 (H) 05/15/2016 0826   LDLCALC 183 (H) 05/15/2015 0900   LDLDIRECT 139.1 05/12/2013 1004   Home Medications   Current Meds  Medication Sig   aspirin  EC 81 MG tablet Take 1 tablet (81 mg total) by mouth daily.   EPINEPHrine  0.3 mg/0.3 mL IJ SOAJ injection Inject 0.3 mLs (0.3 mg total) into the muscle as directed.   escitalopram  (LEXAPRO ) 10 MG tablet Take 1 tablet (10 mg total) by mouth daily. (Patient taking differently: Take 5 mg by mouth daily.)   lisinopril  (ZESTRIL ) 20 MG tablet Take 1 tablet (20 mg total) by mouth daily.   NP THYROID  15 MG tablet Take 15 mg by mouth daily.    Review of Systems      All other systems reviewed and are otherwise negative except as noted above.  Physical Exam    VS:  BP 135/81 (BP Location: Left Arm, Patient Position: Sitting, Cuff Size: Normal)   Pulse 68   Ht 5' 1.5 (1.562 m)   SpO2 95%   BMI 28.81 kg/m  , BMI Body mass index is 28.81 kg/m.  Wt Readings from Last 3 Encounters:  06/16/23 155 lb (70.3 kg)  06/08/23 155 lb (70.3 kg)  06/01/23 155 lb 12.8 oz (70.7 kg)     Affect appropriate Healthy:  appears stated age HEENT: normal Neck supple with no adenopathy JVP normal no bruits no thyromegaly Lungs clear with no wheezing and good diaphragmatic motion Heart:  S1/S2 no murmur, no rub, gallop or click PMI normal Abdomen: benighn, BS positve, no tenderness, no AAA no bruit.  No HSM or HJR Distal pulses intact with no bruits No edema Neuro non-focal Lipoma over left lateral ankle    Assessment & Plan    Palpitations -  benign no evidence of structural HD ECG with NSR may be related to her thyroid  dx no need for monitor    HTN - BP well controlled. Continue current antihypertensive regimen.    HLD - Statin (Pravastatin, Atorvastatin, Rosuvastatin ) and Zetia  intolerant. 05/2021 LDL 119. Not compliant with PSK9.    Calcium  score high for age target LDL < 55  Thyroid :  On low dose synthroid TSH has been normal   5.  Dyspnea:  has been less active with URI and lipoma on left ankle She  is feeling some better now. She line dances 2x/week  Her echo from this am shows normal EF and no significant valve dx and normal diastolic function. She will get her BMET/BNP and CXR after her visit today. Do not suspect any cardiac etiology to her dyspnea  6.  Preoperative :  clear to have lipoma removed from left lateral ankle with Dr Kit    Disposition: Follow up in a year    Signed, Maude Emmer, MD 10/10/2024, 10:41 AM [facility] "

## 2024-10-04 NOTE — Telephone Encounter (Signed)
 Left message second message to call back.SABRA

## 2024-10-06 ENCOUNTER — Telehealth: Payer: Self-pay

## 2024-10-06 DIAGNOSIS — R0602 Shortness of breath: Secondary | ICD-10-CM

## 2024-10-06 NOTE — Telephone Encounter (Signed)
 Left message for patient to call back

## 2024-10-06 NOTE — Telephone Encounter (Signed)
-----   Message from Maude Emmer, MD sent at 10/04/2024 12:56 PM EST ----- Added on to schedule 12/30 for preoop and dyspnea needs echo, BNP and BMET as well as CXR for dyspnea

## 2024-10-10 ENCOUNTER — Ambulatory Visit: Admitting: Cardiovascular Disease

## 2024-10-10 ENCOUNTER — Ambulatory Visit (HOSPITAL_COMMUNITY)
Admission: RE | Admit: 2024-10-10 | Discharge: 2024-10-10 | Disposition: A | Discharge status: Home / Self Care | Source: Ambulatory Visit | Attending: Cardiovascular Disease | Admitting: Cardiovascular Disease

## 2024-10-10 ENCOUNTER — Ambulatory Visit
Admission: RE | Admit: 2024-10-10 | Discharge: 2024-10-10 | Disposition: A | Discharge status: Home / Self Care | Source: Ambulatory Visit | Attending: Cardiovascular Disease | Admitting: Cardiovascular Disease

## 2024-10-10 VITALS — BP 135/81 | HR 68 | Ht 61.5 in | Wt 171.0 lb

## 2024-10-10 DIAGNOSIS — D179 Benign lipomatous neoplasm, unspecified: Secondary | ICD-10-CM | POA: Diagnosis not present

## 2024-10-10 DIAGNOSIS — R0602 Shortness of breath: Secondary | ICD-10-CM

## 2024-10-10 DIAGNOSIS — Z79899 Other long term (current) drug therapy: Secondary | ICD-10-CM | POA: Insufficient documentation

## 2024-10-10 DIAGNOSIS — R002 Palpitations: Secondary | ICD-10-CM | POA: Diagnosis not present

## 2024-10-10 DIAGNOSIS — Z01810 Encounter for preprocedural cardiovascular examination: Secondary | ICD-10-CM | POA: Diagnosis not present

## 2024-10-10 DIAGNOSIS — R0600 Dyspnea, unspecified: Secondary | ICD-10-CM | POA: Insufficient documentation

## 2024-10-10 DIAGNOSIS — I10 Essential (primary) hypertension: Secondary | ICD-10-CM | POA: Insufficient documentation

## 2024-10-10 DIAGNOSIS — Z91199 Patient's noncompliance with other medical treatment and regimen due to unspecified reason: Secondary | ICD-10-CM | POA: Insufficient documentation

## 2024-10-10 DIAGNOSIS — I071 Rheumatic tricuspid insufficiency: Secondary | ICD-10-CM | POA: Diagnosis not present

## 2024-10-10 DIAGNOSIS — E039 Hypothyroidism, unspecified: Secondary | ICD-10-CM | POA: Insufficient documentation

## 2024-10-10 DIAGNOSIS — I358 Other nonrheumatic aortic valve disorders: Secondary | ICD-10-CM | POA: Diagnosis not present

## 2024-10-10 DIAGNOSIS — Z01818 Encounter for other preprocedural examination: Secondary | ICD-10-CM | POA: Diagnosis present

## 2024-10-10 DIAGNOSIS — Z79890 Hormone replacement therapy (postmenopausal): Secondary | ICD-10-CM | POA: Diagnosis not present

## 2024-10-10 DIAGNOSIS — E782 Mixed hyperlipidemia: Secondary | ICD-10-CM | POA: Insufficient documentation

## 2024-10-10 LAB — BASIC METABOLIC PANEL WITH GFR
BUN/Creatinine Ratio: 23 (ref 12–28)
BUN: 18 mg/dL (ref 8–27)
CO2: 23 mmol/L (ref 20–29)
Calcium: 10.7 mg/dL — ABNORMAL HIGH (ref 8.7–10.3)
Chloride: 101 mmol/L (ref 96–106)
Creatinine, Ser: 0.79 mg/dL (ref 0.57–1.00)
Glucose: 88 mg/dL (ref 70–99)
Potassium: 4.2 mmol/L (ref 3.5–5.2)
Sodium: 138 mmol/L (ref 134–144)
eGFR: 81 mL/min/1.73

## 2024-10-10 LAB — ECHOCARDIOGRAM COMPLETE
AR max vel: 1.83 cm2
AV Area VTI: 2.01 cm2
AV Area mean vel: 1.82 cm2
AV Mean grad: 3 mmHg
AV Peak grad: 5.2 mmHg
Ao pk vel: 1.14 m/s
Area-P 1/2: 3.5 cm2
S' Lateral: 2.3 cm

## 2024-10-10 LAB — PRO B NATRIURETIC PEPTIDE: NT-Pro BNP: 41 pg/mL (ref 0–301)

## 2024-10-10 NOTE — Patient Instructions (Signed)
 Medication Instructions:  No medication changes were made at this visit. Continue current regimen.   *If you need a refill on your cardiac medications before your next appointment, please call your pharmacy*  Lab Work: To be completed today: BMP and BNP  If you have labs (blood work) drawn today and your tests are completely normal, you will receive your results only by: MyChart Message (if you have MyChart) OR A paper copy in the mail If you have any lab test that is abnormal or we need to change your treatment, we will call you to review the results.  Testing/Procedures: Chest X-Ray  Follow-Up: At [facility], you and your health needs are our priority.  As part of our continuing mission to provide you with exceptional heart care, our providers are all part of one team.  This team includes your primary Cardiologist (physician) and Advanced Practice Providers or APPs (Physician Assistants and Nurse Practitioners) who all work together to provide you with the care you need, when you need it.  Your next appointment:   6 month(s)  Provider:   Maude Emmer, MD

## 2024-10-11 ENCOUNTER — Ambulatory Visit: Payer: Self-pay | Admitting: *Deleted

## 2024-10-17 ENCOUNTER — Other Ambulatory Visit: Payer: Self-pay | Admitting: Orthopedic Surgery

## 2024-10-19 ENCOUNTER — Telehealth: Payer: Self-pay

## 2024-10-19 NOTE — Telephone Encounter (Signed)
 Attempted to reach patient concerning colonoscopy recall; unable to speak with patient;  left message and number to the office for patient to call back and schedule appts;

## 2024-10-20 LAB — SURGICAL PATHOLOGY

## 2024-10-21 ENCOUNTER — Ambulatory Visit: Payer: Self-pay | Admitting: Cardiovascular Disease

## 2024-10-31 ENCOUNTER — Ambulatory Visit: Admitting: Cardiovascular Disease
